# Patient Record
Sex: Female | Born: 1965 | Race: White | Hispanic: No | Marital: Married | Smoking: Never smoker
Health system: Southern US, Community
[De-identification: ages and names within clinical notes are randomized; demographics above are authoritative.]

## PROBLEM LIST (undated history)

## (undated) DIAGNOSIS — M1990 Unspecified osteoarthritis, unspecified site: Secondary | ICD-10-CM

## (undated) DIAGNOSIS — N189 Chronic kidney disease, unspecified: Secondary | ICD-10-CM

## (undated) DIAGNOSIS — C7A8 Other malignant neuroendocrine tumors: Secondary | ICD-10-CM

## (undated) DIAGNOSIS — E785 Hyperlipidemia, unspecified: Secondary | ICD-10-CM

## (undated) DIAGNOSIS — I10 Essential (primary) hypertension: Secondary | ICD-10-CM

## (undated) DIAGNOSIS — Q613 Polycystic kidney, unspecified: Secondary | ICD-10-CM

## (undated) DIAGNOSIS — H269 Unspecified cataract: Secondary | ICD-10-CM

## (undated) DIAGNOSIS — Z87440 Personal history of urinary (tract) infections: Secondary | ICD-10-CM

## (undated) DIAGNOSIS — N2881 Hypertrophy of kidney: Secondary | ICD-10-CM

## (undated) DIAGNOSIS — L309 Dermatitis, unspecified: Secondary | ICD-10-CM

## (undated) DIAGNOSIS — L409 Psoriasis, unspecified: Secondary | ICD-10-CM

## (undated) DIAGNOSIS — Z8619 Personal history of other infectious and parasitic diseases: Secondary | ICD-10-CM

## (undated) DIAGNOSIS — E119 Type 2 diabetes mellitus without complications: Secondary | ICD-10-CM

## (undated) DIAGNOSIS — Z940 Kidney transplant status: Secondary | ICD-10-CM

## (undated) DIAGNOSIS — R0600 Dyspnea, unspecified: Secondary | ICD-10-CM

## (undated) HISTORY — DX: Personal history of other infectious and parasitic diseases: Z86.19

## (undated) HISTORY — PX: APPENDECTOMY: SHX54

## (undated) HISTORY — DX: Polycystic kidney, unspecified: Q61.3

## (undated) HISTORY — DX: Hypertrophy of kidney: N28.81

## (undated) HISTORY — DX: Essential (primary) hypertension: I10

## (undated) HISTORY — DX: Chronic kidney disease, unspecified: N18.9

## (undated) HISTORY — DX: Personal history of urinary (tract) infections: Z87.440

## (undated) HISTORY — PX: ABDOMINAL HYSTERECTOMY: SHX81

## (undated) HISTORY — DX: Type 2 diabetes mellitus without complications: E11.9

## (undated) HISTORY — PX: SMALL INTESTINE SURGERY: SHX150

## (undated) HISTORY — DX: Hyperlipidemia, unspecified: E78.5

## (undated) HISTORY — DX: Dermatitis, unspecified: L30.9

## (undated) HISTORY — DX: Unspecified osteoarthritis, unspecified site: M19.90

## (undated) HISTORY — PX: NEPHRECTOMY TRANSPLANTED ORGAN: SUR880

## (undated) HISTORY — PX: COLON SURGERY: SHX602

## (undated) HISTORY — DX: Kidney transplant status: Z94.0

## (undated) HISTORY — DX: Other malignant neuroendocrine tumors: C7A.8

## (undated) HISTORY — PX: PERITONEAL CATHETER INSERTION: SHX2223

## (undated) HISTORY — DX: Unspecified cataract: H26.9

## (undated) HISTORY — DX: Psoriasis, unspecified: L40.9

---

## 1998-07-26 ENCOUNTER — Emergency Department (HOSPITAL_COMMUNITY): Admission: EM | Admit: 1998-07-26 | Discharge: 1998-07-26 | Payer: Self-pay | Admitting: *Deleted

## 1999-10-15 ENCOUNTER — Encounter: Payer: Self-pay | Admitting: Family Medicine

## 1999-10-15 ENCOUNTER — Ambulatory Visit (HOSPITAL_COMMUNITY): Admission: RE | Admit: 1999-10-15 | Discharge: 1999-10-15 | Payer: Self-pay | Admitting: Family Medicine

## 1999-11-08 ENCOUNTER — Encounter: Payer: Self-pay | Admitting: Nephrology

## 1999-11-08 ENCOUNTER — Ambulatory Visit (HOSPITAL_COMMUNITY): Admission: RE | Admit: 1999-11-08 | Discharge: 1999-11-08 | Payer: Self-pay | Admitting: Nephrology

## 2000-04-12 ENCOUNTER — Encounter: Admission: RE | Admit: 2000-04-12 | Discharge: 2000-04-12 | Payer: Self-pay | Admitting: Obstetrics and Gynecology

## 2000-04-12 ENCOUNTER — Encounter: Payer: Self-pay | Admitting: Obstetrics and Gynecology

## 2000-04-20 ENCOUNTER — Encounter (INDEPENDENT_AMBULATORY_CARE_PROVIDER_SITE_OTHER): Payer: Self-pay

## 2000-04-20 ENCOUNTER — Other Ambulatory Visit: Admission: RE | Admit: 2000-04-20 | Discharge: 2000-04-20 | Payer: Self-pay | Admitting: Obstetrics and Gynecology

## 2000-07-04 ENCOUNTER — Encounter: Admission: RE | Admit: 2000-07-04 | Discharge: 2000-07-04 | Payer: Self-pay | Admitting: Nephrology

## 2000-07-04 ENCOUNTER — Encounter: Payer: Self-pay | Admitting: Nephrology

## 2000-07-12 ENCOUNTER — Encounter: Admission: RE | Admit: 2000-07-12 | Discharge: 2000-07-12 | Payer: Self-pay | Admitting: Obstetrics and Gynecology

## 2000-07-12 ENCOUNTER — Encounter: Payer: Self-pay | Admitting: Obstetrics and Gynecology

## 2000-07-29 ENCOUNTER — Emergency Department (HOSPITAL_COMMUNITY): Admission: EM | Admit: 2000-07-29 | Discharge: 2000-07-29 | Payer: Self-pay | Admitting: Emergency Medicine

## 2000-07-29 ENCOUNTER — Encounter: Payer: Self-pay | Admitting: Emergency Medicine

## 2000-09-06 ENCOUNTER — Encounter: Admission: RE | Admit: 2000-09-06 | Discharge: 2000-09-06 | Payer: Self-pay | Admitting: Obstetrics and Gynecology

## 2000-09-06 ENCOUNTER — Encounter: Payer: Self-pay | Admitting: Obstetrics and Gynecology

## 2000-09-26 ENCOUNTER — Other Ambulatory Visit: Admission: RE | Admit: 2000-09-26 | Discharge: 2000-09-26 | Payer: Self-pay | Admitting: Obstetrics and Gynecology

## 2000-11-02 ENCOUNTER — Encounter (INDEPENDENT_AMBULATORY_CARE_PROVIDER_SITE_OTHER): Payer: Self-pay

## 2000-11-02 ENCOUNTER — Inpatient Hospital Stay (HOSPITAL_COMMUNITY): Admission: RE | Admit: 2000-11-02 | Discharge: 2000-11-05 | Payer: Self-pay | Admitting: Obstetrics and Gynecology

## 2003-01-11 HISTORY — PX: HERNIA REPAIR: SHX51

## 2003-01-19 ENCOUNTER — Emergency Department (HOSPITAL_COMMUNITY): Admission: EM | Admit: 2003-01-19 | Discharge: 2003-01-19 | Payer: Self-pay | Admitting: Emergency Medicine

## 2003-02-21 ENCOUNTER — Ambulatory Visit (HOSPITAL_COMMUNITY): Admission: RE | Admit: 2003-02-21 | Discharge: 2003-02-21 | Payer: Self-pay | Admitting: General Surgery

## 2003-07-23 ENCOUNTER — Emergency Department (HOSPITAL_COMMUNITY): Admission: EM | Admit: 2003-07-23 | Discharge: 2003-07-23 | Payer: Self-pay | Admitting: *Deleted

## 2003-11-10 ENCOUNTER — Other Ambulatory Visit: Admission: RE | Admit: 2003-11-10 | Discharge: 2003-11-10 | Payer: Self-pay | Admitting: Obstetrics and Gynecology

## 2004-11-10 ENCOUNTER — Other Ambulatory Visit: Admission: RE | Admit: 2004-11-10 | Discharge: 2004-11-10 | Payer: Self-pay | Admitting: Obstetrics and Gynecology

## 2005-07-14 ENCOUNTER — Encounter: Admission: RE | Admit: 2005-07-14 | Discharge: 2005-07-14 | Payer: Self-pay | Admitting: Nephrology

## 2006-01-06 ENCOUNTER — Other Ambulatory Visit: Admission: RE | Admit: 2006-01-06 | Discharge: 2006-01-06 | Payer: Self-pay | Admitting: Obstetrics and Gynecology

## 2006-04-18 ENCOUNTER — Encounter: Admission: RE | Admit: 2006-04-18 | Discharge: 2006-04-18 | Payer: Self-pay | Admitting: Nephrology

## 2007-01-09 ENCOUNTER — Other Ambulatory Visit: Admission: RE | Admit: 2007-01-09 | Discharge: 2007-01-09 | Payer: Self-pay | Admitting: Obstetrics and Gynecology

## 2009-01-10 HISTORY — PX: CATARACT EXTRACTION: SUR2

## 2009-05-15 ENCOUNTER — Emergency Department (HOSPITAL_COMMUNITY): Admission: EM | Admit: 2009-05-15 | Discharge: 2009-05-15 | Payer: Self-pay | Admitting: Emergency Medicine

## 2009-10-02 ENCOUNTER — Emergency Department (HOSPITAL_COMMUNITY): Admission: EM | Admit: 2009-10-02 | Discharge: 2009-10-02 | Payer: Self-pay | Admitting: Emergency Medicine

## 2009-10-05 ENCOUNTER — Encounter: Admission: RE | Admit: 2009-10-05 | Discharge: 2009-10-05 | Payer: Self-pay | Admitting: Orthopedic Surgery

## 2010-01-31 ENCOUNTER — Encounter: Payer: Self-pay | Admitting: Nephrology

## 2010-02-04 ENCOUNTER — Encounter
Admission: RE | Admit: 2010-02-04 | Discharge: 2010-02-09 | Payer: Self-pay | Source: Home / Self Care | Attending: Nephrology | Admitting: Nephrology

## 2010-03-23 ENCOUNTER — Other Ambulatory Visit: Payer: Self-pay | Admitting: Obstetrics and Gynecology

## 2010-03-23 DIAGNOSIS — Z1231 Encounter for screening mammogram for malignant neoplasm of breast: Secondary | ICD-10-CM

## 2010-03-30 LAB — BASIC METABOLIC PANEL
Chloride: 107 mEq/L (ref 96–112)
Creatinine, Ser: 3.18 mg/dL — ABNORMAL HIGH (ref 0.4–1.2)
GFR calc Af Amer: 19 mL/min — ABNORMAL LOW (ref >60)
GFR calc non Af Amer: 16 mL/min — ABNORMAL LOW (ref >60)
Potassium: 3.7 mEq/L (ref 3.5–5.1)

## 2010-03-30 LAB — DIFFERENTIAL
Eosinophils Absolute: 0.2 10*3/uL (ref 0.0–0.7)
Lymphocytes Relative: 21 % (ref 12–46)
Lymphs Abs: 1.3 10*3/uL (ref 0.7–4.0)
Monocytes Relative: 7 % (ref 3–12)
Neutro Abs: 4.2 10*3/uL (ref 1.7–7.7)
Neutrophils Relative %: 68 % (ref 43–77)

## 2010-03-30 LAB — GLUCOSE, CAPILLARY

## 2010-03-30 LAB — URINALYSIS, ROUTINE W REFLEX MICROSCOPIC
Leukocytes, UA: NEGATIVE
Nitrite: NEGATIVE
Specific Gravity, Urine: 1.011 (ref 1.005–1.030)
Urobilinogen, UA: 0.2 mg/dL (ref 0.0–1.0)
pH: 6 (ref 5.0–8.0)

## 2010-03-30 LAB — URINE MICROSCOPIC-ADD ON

## 2010-03-30 LAB — POCT I-STAT, CHEM 8
Chloride: 108 mEq/L (ref 96–112)
HCT: 38 % (ref 36.0–46.0)
Hemoglobin: 12.9 g/dL (ref 12.0–15.0)
Potassium: 3.5 mEq/L (ref 3.5–5.1)
Sodium: 142 mEq/L (ref 135–145)

## 2010-03-30 LAB — CBC
MCV: 95 fL (ref 78.0–100.0)
RBC: 3.82 MIL/uL — ABNORMAL LOW (ref 3.87–5.11)
WBC: 6.1 10*3/uL (ref 4.0–10.5)

## 2010-03-31 ENCOUNTER — Ambulatory Visit
Admission: RE | Admit: 2010-03-31 | Discharge: 2010-03-31 | Disposition: A | Discharge status: Home / Self Care | Payer: Managed Care, Other (non HMO) | Source: Ambulatory Visit | Attending: *Deleted | Admitting: *Deleted

## 2010-03-31 DIAGNOSIS — Z1231 Encounter for screening mammogram for malignant neoplasm of breast: Secondary | ICD-10-CM

## 2010-05-28 NOTE — Discharge Summary (Signed)
[facility]  Patient:    [name] [name], [place] Visit Number: [id number] MRN: [id number]          Service Type: GYN Location: [id number] Attending Physician:  Madelyn Flavors Dictated by:   Beather Arbour Thomasena Edis, M.D. Admit Date:  11/02/2000 Discharge Date: 11/05/2000   CC:         Fayrene Fearing L. Deterding, M.D.   Discharge Summary  HISTORY OF PRESENT ILLNESS:   The patient is a 45 year old para 2, Caucasian female, admitted for TAH-BSO for menorrhagia unresponsive to medical management.  Patient had an ultrasound, sonohysterogram, endometrial biopsy, all of which were normal.  TSH was normal.  She was treated with Micronor, but continued to bleed daily.  She expressed the desire strongly on numerous occasions to have a hysterectomy.  She states that she is concerned that she may become so ill from her polycystic kidney disease that she will be unable to have a hysterectomy.  Risks were discussed with the patient and she still desired to proceed with the surgery.  For further details, please see the written admission history and physical.  HOSPITAL COURSE:              The patient was admitted, subsequently underwent a TAH-BSO without difficulty.  Postoperatively, the patient was doing well, receiving excellent analgesia.  On postoperative day 1, temperature was 98.5. She was complaining of some right lateral rib pain with walking.  Blood pressure 120/65.  The rib pain was felt to be costochondritis or gas entrapment.  White count was 5000, hemoglobin was 11.5.  Patient continued to have a stable postoperative course and was discharged home on postoperative day 3, after she was able to tolerate a regular diet.  She had not passed any flatus, but was able to tolerate a diet, so it was this physicians judgment that it was fine to send her home.  Basic metabolic profile was normal. Patient was given a prescription for Tylox, dispense 30, 1-2 p.o.  q.4-6h. p.r.n. pain.  She subsequently did pass flatus and was discharged home.  She was urged to return to the office to have staples removed in a few days.  The patient will call with any problems. Dictated by:   Beather Arbour Thomasena Edis, M.D. Attending Physician:  Madelyn Flavors DD:  12/13/00 TD:  12/13/00 Job: [id number]

## 2010-05-28 NOTE — Op Note (Signed)
NAME:  [name] [name], [name] [name]                           ACCOUNT NO.:  [id number]   MEDICAL RECORD NO.:  [id number]                   PATIENT TYPE:  AMB   LOCATION:  DAY                                  FACILITY:  [facility]   PHYSICIAN:  Ollen Gross. Vernell Morgans, M.D.              DATE OF BIRTH:  09-Mar-1965   DATE OF PROCEDURE:  02/21/2003  DATE OF DISCHARGE:                                 OPERATIVE REPORT   PREOPERATIVE DIAGNOSIS:  Umbilical hernia.   POSTOPERATIVE DIAGNOSIS:  Umbilical hernia.   OPERATION/PROCEDURE:  Umbilical hernia repair with mesh.   SURGEON:  Ollen Gross. Carolynne Edouard, M.D.   ANESTHESIA:  General endotracheal anesthesia.   OPERATION/PROCEDURE:  After informed consent was obtained, the patient was  brought to the operating room and placed in the supine position on the  operating room table.  After adequate induction of general anesthesia, the  patient's abdomen was prepped with Betadine and draped in the usual sterile  manner.  The area above the umbilicus was infiltrated with 0.25% Marcaine  and a small curvilinear incision was made overlying the umbilicus.  This  incision was carried down through the skin and subcutaneous tissue sharply  with the electrocautery.  The hernia sac was identified and was dissected  from the rest of the subcutaneous tissues by a combination of blunt  dissection and sharp dissection with the electrocautery until the edges of  the fascial defect were encountered.  The hernia was able to be reduced.  The hernia only contained some preperitoneal fat.  The hernia was able to be  reduced without difficulty.  The fascial edges were cleaned and strong and  the defect was only about 1 cm in diameter.  The fascial defect was then  closed with interrupted #1 Prolene stitches.  A small piece of 3 x 6 mesh  was then cut to about 3 cm in diameter and this mesh was placed as an on-lay  on top of the repair and anchored circumferentially with interrupted 0  Prolene stitches to  the fascia.  The wound was then irrigated with copious  amounts of saline.  The subcutaneous tissue was closed with interrupted 3-0  Vicryl stitches and the skin was closed with a running 4-0 Monocryl  subcuticular stitch.  Benzoin and Steri-Strips, cotton balls and sterile  dressings were applied.  The patient tolerated the procedure well.  At the  end of the case all needle, sponge and instrument counts were correct.  The  patient was awakened and taken to the recovery room in stable condition.                                               Ollen Gross. Vernell Morgans, M.D.    PST/MEDQ  D:  02/22/2003  T:  02/22/2003  Job:  [id number]

## 2010-09-08 ENCOUNTER — Ambulatory Visit (INDEPENDENT_AMBULATORY_CARE_PROVIDER_SITE_OTHER): Payer: Managed Care, Other (non HMO) | Admitting: General Surgery

## 2010-09-08 ENCOUNTER — Encounter (INDEPENDENT_AMBULATORY_CARE_PROVIDER_SITE_OTHER): Payer: Self-pay | Admitting: General Surgery

## 2010-09-08 VITALS — BP 146/96 | HR 80 | Temp 97.6°F | Ht 65.0 in | Wt 241.8 lb

## 2010-09-08 DIAGNOSIS — N186 End stage renal disease: Secondary | ICD-10-CM

## 2010-09-08 DIAGNOSIS — Z992 Dependence on renal dialysis: Secondary | ICD-10-CM

## 2010-09-08 NOTE — Progress Notes (Signed)
Subjective:   End-stage renal disease, desires peritoneal dialysis  Patient ID: [name] [name], female   DOB: 1965/12/29, 45 y.o.   MRN: [id number]  HPI Patient is a 45 year old female with polycystic kidney disease and progressive renal insufficiency. She is followed by Dr. Kathrene Bongo. Her GFR is now calculated in the teens and she is beginning to have some symptoms that suggest uremia. She has been to class and feels that she would do much better with peritoneal dialysis.  Past Medical History  Diagnosis Date  . Eczema   . Psoriasis   . Hypertension   . Enlarged kidney     bilateral  . Chronic kidney disease   . Gout     previously   Past Surgical History  Procedure Date  . Cesarean section 1995 and 1997  . Abdominal hysterectomy 2000?    complete  . Hernia repair 2005    umbilical hernia  . Cataract extraction 2011    bilateral   Current Outpatient Prescriptions  Medication Sig Dispense Refill  . Acetaminophen (TYLENOL PO) Take by mouth as needed.        Marland Kitchen allopurinol (ZYLOPRIM) 100 MG tablet Three times a day.      Marland Kitchen atorvastatin (LIPITOR) 40 MG tablet daily.      . calcitRIOL (ROCALTROL) 0.5 MCG capsule daily.      . furosemide (LASIX) 40 MG tablet BID times 48H.      . Multiple Vitamin (MULTIVITAMIN) capsule Take 1 capsule by mouth daily.        Marland Kitchen omeprazole (PRILOSEC) 20 MG capsule Take 20 mg by mouth daily.        . quinapril (ACCUPRIL) 20 MG tablet daily.      . traMADol (ULTRAM) 50 MG tablet Ad lib.       No Known Allergies  History  Substance Use Topics  . Smoking status: Never Smoker   . Smokeless tobacco: Not on file  . Alcohol Use: No    Review of Systems  Constitutional: Negative.   Eyes: Negative.   Respiratory: Negative.   Cardiovascular: Negative.   Gastrointestinal: Positive for nausea. Negative for vomiting, abdominal pain, constipation and abdominal distention.  Musculoskeletal: Positive for arthralgias.  Neurological: Positive for headaches.         Objective:   Physical Exam General: Obese Caucasian female in no acute distress Skin: Warm and dry without rash or infection HEENT: No palpable masses or thyromegaly. Sclera nonicteric. Lymph nodes: No cervical, supraclavicular, or inguinal nodes palpable Lungs: Clear without wheezing or increased work of breathing Cardiovascular: Regular rate and rhythm without murmur. No JVD or edema. Abdomen: Soft and nontender. Healed small umbilical incision and Pfannenstiel incision. No discernible masses or organomegaly. Extremities: No edema or joint swelling Neurologic: Alert and fully oriented. Gait normal.   Assessment:     45 year old female with end-stage renal disease secondary to polycystic kidneys and impending need for dialysis. She desires peritoneal dialysis. She would appear to be an acceptable candidate. We discussed peritoneal catheter placement including its nature and risks of bleeding, infection, bowel injury, and anesthetic complications. We discussed long-term problems of peritonitis, exit site infection, catheter displacement and malfunction    Plan:     After our discussion she desires placement of a peritoneal dialysis catheter. We will schedule this in the near future as an outpatient.

## 2010-09-08 NOTE — Patient Instructions (Signed)
Call for any questions or concerns preoperatively

## 2010-10-19 ENCOUNTER — Encounter (HOSPITAL_COMMUNITY)
Admission: RE | Admit: 2010-10-19 | Discharge: 2010-10-19 | Disposition: A | Discharge status: Home / Self Care | Payer: Managed Care, Other (non HMO) | Source: Ambulatory Visit | Attending: General Surgery | Admitting: General Surgery

## 2010-10-19 ENCOUNTER — Other Ambulatory Visit (INDEPENDENT_AMBULATORY_CARE_PROVIDER_SITE_OTHER): Payer: Self-pay | Admitting: General Surgery

## 2010-10-19 DIAGNOSIS — N186 End stage renal disease: Secondary | ICD-10-CM

## 2010-10-19 LAB — CBC
Platelets: 213 10*3/uL (ref 150–400)
RBC: 4.18 MIL/uL (ref 3.87–5.11)
WBC: 6.4 10*3/uL (ref 4.0–10.5)

## 2010-10-19 LAB — DIFFERENTIAL
Basophils Relative: 1 % (ref 0–1)
Eosinophils Absolute: 0.3 10*3/uL (ref 0.0–0.7)
Lymphs Abs: 1.6 10*3/uL (ref 0.7–4.0)
Neutrophils Relative %: 62 % (ref 43–77)

## 2010-10-19 LAB — BASIC METABOLIC PANEL
CO2: 25 mEq/L (ref 19–32)
Chloride: 102 mEq/L (ref 96–112)
Sodium: 141 mEq/L (ref 135–145)

## 2010-10-19 LAB — SURGICAL PCR SCREEN
MRSA, PCR: NEGATIVE
Staphylococcus aureus: NEGATIVE

## 2010-10-22 ENCOUNTER — Ambulatory Visit (HOSPITAL_COMMUNITY)
Admission: RE | Admit: 2010-10-22 | Discharge: 2010-10-22 | Disposition: A | Discharge status: Home / Self Care | Payer: Managed Care, Other (non HMO) | Source: Ambulatory Visit | Attending: General Surgery | Admitting: General Surgery

## 2010-10-22 DIAGNOSIS — N186 End stage renal disease: Secondary | ICD-10-CM | POA: Insufficient documentation

## 2010-10-22 DIAGNOSIS — Z01810 Encounter for preprocedural cardiovascular examination: Secondary | ICD-10-CM | POA: Insufficient documentation

## 2010-10-22 DIAGNOSIS — I120 Hypertensive chronic kidney disease with stage 5 chronic kidney disease or end stage renal disease: Secondary | ICD-10-CM | POA: Insufficient documentation

## 2010-10-22 DIAGNOSIS — Z01812 Encounter for preprocedural laboratory examination: Secondary | ICD-10-CM | POA: Insufficient documentation

## 2010-10-22 DIAGNOSIS — Z01818 Encounter for other preprocedural examination: Secondary | ICD-10-CM | POA: Insufficient documentation

## 2010-10-29 ENCOUNTER — Emergency Department (HOSPITAL_COMMUNITY): Payer: Managed Care, Other (non HMO)

## 2010-10-29 ENCOUNTER — Inpatient Hospital Stay (HOSPITAL_COMMUNITY)
Admission: EM | Admit: 2010-10-29 | Discharge: 2010-11-02 | DRG: 919 | Disposition: A | Discharge status: Home / Self Care | Payer: Managed Care, Other (non HMO) | Source: Ambulatory Visit | Attending: Internal Medicine | Admitting: Internal Medicine

## 2010-10-29 DIAGNOSIS — E785 Hyperlipidemia, unspecified: Secondary | ICD-10-CM | POA: Diagnosis present

## 2010-10-29 DIAGNOSIS — Y841 Kidney dialysis as the cause of abnormal reaction of the patient, or of later complication, without mention of misadventure at the time of the procedure: Secondary | ICD-10-CM | POA: Diagnosis present

## 2010-10-29 DIAGNOSIS — M109 Gout, unspecified: Secondary | ICD-10-CM | POA: Diagnosis present

## 2010-10-29 DIAGNOSIS — Z79899 Other long term (current) drug therapy: Secondary | ICD-10-CM

## 2010-10-29 DIAGNOSIS — K219 Gastro-esophageal reflux disease without esophagitis: Secondary | ICD-10-CM | POA: Diagnosis present

## 2010-10-29 DIAGNOSIS — Y92009 Unspecified place in unspecified non-institutional (private) residence as the place of occurrence of the external cause: Secondary | ICD-10-CM

## 2010-10-29 DIAGNOSIS — K5900 Constipation, unspecified: Secondary | ICD-10-CM | POA: Diagnosis present

## 2010-10-29 DIAGNOSIS — N186 End stage renal disease: Secondary | ICD-10-CM | POA: Diagnosis present

## 2010-10-29 DIAGNOSIS — N2581 Secondary hyperparathyroidism of renal origin: Secondary | ICD-10-CM | POA: Diagnosis present

## 2010-10-29 DIAGNOSIS — I120 Hypertensive chronic kidney disease with stage 5 chronic kidney disease or end stage renal disease: Secondary | ICD-10-CM | POA: Diagnosis present

## 2010-10-29 DIAGNOSIS — N179 Acute kidney failure, unspecified: Secondary | ICD-10-CM | POA: Diagnosis present

## 2010-10-29 DIAGNOSIS — Z7682 Awaiting organ transplant status: Secondary | ICD-10-CM

## 2010-10-29 DIAGNOSIS — T8571XA Infection and inflammatory reaction due to peritoneal dialysis catheter, initial encounter: Principal | ICD-10-CM | POA: Diagnosis present

## 2010-10-29 DIAGNOSIS — K658 Other peritonitis: Secondary | ICD-10-CM | POA: Diagnosis present

## 2010-10-29 DIAGNOSIS — Q612 Polycystic kidney, adult type: Secondary | ICD-10-CM

## 2010-10-29 LAB — CBC
HCT: 40.9 % (ref 36.0–46.0)
Hemoglobin: 13.8 g/dL (ref 12.0–15.0)
RBC: 4.45 MIL/uL (ref 3.87–5.11)
RDW: 13.9 % (ref 11.5–15.5)
WBC: 15 10*3/uL — ABNORMAL HIGH (ref 4.0–10.5)

## 2010-10-29 LAB — BODY FLUID CELL COUNT WITH DIFFERENTIAL
Eos, Fluid: 3 %
Lymphs, Fluid: 9 %
Monocyte-Macrophage-Serous Fluid: 20 % — ABNORMAL LOW (ref 50–90)
Neutrophil Count, Fluid: 67 % — ABNORMAL HIGH (ref 0–25)

## 2010-10-29 LAB — URINALYSIS, ROUTINE W REFLEX MICROSCOPIC
Glucose, UA: NEGATIVE mg/dL
Ketones, ur: NEGATIVE mg/dL
pH: 5 (ref 5.0–8.0)

## 2010-10-29 LAB — COMPREHENSIVE METABOLIC PANEL
AST: 16 U/L (ref 0–37)
Albumin: 3.7 g/dL (ref 3.5–5.2)
Chloride: 95 mEq/L — ABNORMAL LOW (ref 96–112)
Creatinine, Ser: 4.28 mg/dL — ABNORMAL HIGH (ref 0.50–1.10)
Total Bilirubin: 0.7 mg/dL (ref 0.3–1.2)

## 2010-10-29 LAB — APTT: aPTT: 28 seconds (ref 24–37)

## 2010-10-29 LAB — URINE MICROSCOPIC-ADD ON

## 2010-10-29 LAB — DIFFERENTIAL
Basophils Absolute: 0 10*3/uL (ref 0.0–0.1)
Lymphocytes Relative: 2 % — ABNORMAL LOW (ref 12–46)
Neutro Abs: 14 10*3/uL — ABNORMAL HIGH (ref 1.7–7.7)
Neutrophils Relative %: 94 % — ABNORMAL HIGH (ref 43–77)

## 2010-10-29 LAB — PROTIME-INR: INR: 1.03 (ref 0.00–1.49)

## 2010-10-30 LAB — PHOSPHORUS: Phosphorus: 5.5 mg/dL — ABNORMAL HIGH (ref 2.3–4.6)

## 2010-10-30 LAB — DIFFERENTIAL
Basophils Relative: 0 % (ref 0–1)
Eosinophils Absolute: 0 10*3/uL (ref 0.0–0.7)
Neutrophils Relative %: 88 % — ABNORMAL HIGH (ref 43–77)

## 2010-10-30 LAB — BASIC METABOLIC PANEL
CO2: 24 mEq/L (ref 19–32)
Calcium: 9.2 mg/dL (ref 8.4–10.5)
Chloride: 100 mEq/L (ref 96–112)
GFR calc Af Amer: 12 mL/min — ABNORMAL LOW (ref >90)
Sodium: 139 mEq/L (ref 135–145)

## 2010-10-30 LAB — CBC
Platelets: 177 10*3/uL (ref 150–400)
RBC: 4.02 MIL/uL (ref 3.87–5.11)
RDW: 14 % (ref 11.5–15.5)
WBC: 13.4 10*3/uL — ABNORMAL HIGH (ref 4.0–10.5)

## 2010-10-30 LAB — MAGNESIUM: Magnesium: 2.1 mg/dL (ref 1.5–2.5)

## 2010-10-31 DIAGNOSIS — K650 Generalized (acute) peritonitis: Secondary | ICD-10-CM

## 2010-10-31 LAB — CBC
HCT: 33 % — ABNORMAL LOW (ref 36.0–46.0)
MCH: 30.6 pg (ref 26.0–34.0)
MCV: 91.9 fL (ref 78.0–100.0)
Platelets: 168 10*3/uL (ref 150–400)
RBC: 3.59 MIL/uL — ABNORMAL LOW (ref 3.87–5.11)

## 2010-10-31 LAB — BASIC METABOLIC PANEL
BUN: 87 mg/dL — ABNORMAL HIGH (ref 6–23)
CO2: 21 mEq/L (ref 19–32)
Calcium: 8.7 mg/dL (ref 8.4–10.5)
Chloride: 99 mEq/L (ref 96–112)
Creatinine, Ser: 5.24 mg/dL — ABNORMAL HIGH (ref 0.50–1.10)
Glucose, Bld: 101 mg/dL — ABNORMAL HIGH (ref 70–99)

## 2010-11-01 LAB — BODY FLUID CELL COUNT WITH DIFFERENTIAL
Eos, Fluid: 2 %
Lymphs, Fluid: 3 %
Monocyte-Macrophage-Serous Fluid: 13 % — ABNORMAL LOW (ref 50–90)
Neutrophil Count, Fluid: 82 % — ABNORMAL HIGH (ref 0–25)
Total Nucleated Cell Count, Fluid: 600 cu mm (ref 0–1000)

## 2010-11-01 LAB — CBC
MCHC: 33.3 g/dL (ref 30.0–36.0)
Platelets: 157 10*3/uL (ref 150–400)
RDW: 13.8 % (ref 11.5–15.5)
WBC: 7.2 10*3/uL (ref 4.0–10.5)

## 2010-11-01 LAB — PATHOLOGIST SMEAR REVIEW

## 2010-11-01 NOTE — Op Note (Signed)
  NAMEFUJIE, DICKISON                 ACCOUNT NO.:  [id number]  MEDICAL RECORD NO.:  [id number]  LOCATION:  [facility]SC                         FACILITY:  [facility]  PHYSICIAN:  Sharlet Salina T. Isatu Macinnes, M.D.DATE OF BIRTH:  December 26, 1965  DATE OF PROCEDURE:  10/22/2010 DATE OF DISCHARGE:                              OPERATIVE REPORT   PREOPERATIVE DIAGNOSIS:  End-stage renal disease with pending dialysis.  POSTOPERATIVE DIAGNOSIS:  End-stage renal disease with pending dialysis.  SURGICAL PROCEDURE:  Laparoscopic placement of peritoneal dialysis catheter.  BRIEF HISTORY:  [name] [name] is a 45 year old female with polycystic kidney disease and deteriorating renal function and impending dialysis. She desires peritoneal dialysis and after evaluation was felt to be a good candidate.  She is now brought to the operating room for laparoscopic placement of a peritoneal dialysis catheter.  We discussed the nature of the procedure, indications, risks of anesthetic complications, bleeding, infection, intestinal injury, and long-term catheter risks of malfunction, displacement of peritonitis and exit site infection.  She is now brought to the operating room for this procedure.  DESCRIPTION OF OPERATION:  The patient was brought to the room and placed in supine position on the operating table and general endotracheal anesthesia was induced.  The abdomen was widely and sterilely prepped and draped.  She received preoperative intravenous antibiotics.  Correct patient, procedure were verified.  Access was obtained with a 5-mm Optiview trocar in the left upper quadrant without difficulty and pneumoperitoneum established.  There was no evidence of trocar injury.  Under direct vision, a 12-mm trocar was placed to the right of the umbilicus.  The patient was status post hysterectomy. There were no significant adhesions in the pelvis.  A right-sided [place] peritoneal dialysis catheter was chosen and the catheter  was oriented and then passed through the 12-mm trocar and advanced until the silastic ball was just deep to the peritoneum as the 12-mm trocar was gradually withdrawn leaving the Teflon cuff in the abdominal wall.  The catheter was seen directly down into the pelvis in the midline. Following this, the external end of the catheter was tunneled subcutaneously to an inferior exit site and the cuff positioned about a cm deep to the skin.  I flushed the catheter with saline and flushed and drained easily.  All CO2 was evacuated and trocars removed.  Skin incisions were closed with running 5-0 nylon.  Sponge, needle, and instrument counts were correct.  The patient was taken to the recovery room in good condition.     Lorne Skeens. Elody Kleinsasser, M.D.    Tory Emerald  D:  10/22/2010  T:  10/22/2010  Job:  [id number]  cc:   Cecille Aver, M.D.  Electronically Signed by Glenna Fellows M.D. on 11/01/2010 02:45:01 PM

## 2010-11-02 LAB — RENAL FUNCTION PANEL
BUN: 56 mg/dL — ABNORMAL HIGH (ref 6–23)
CO2: 24 mEq/L (ref 19–32)
Chloride: 104 mEq/L (ref 96–112)
GFR calc Af Amer: 16 mL/min — ABNORMAL LOW (ref >90)
Glucose, Bld: 82 mg/dL (ref 70–99)
Phosphorus: 3 mg/dL (ref 2.3–4.6)
Potassium: 3.4 mEq/L — ABNORMAL LOW (ref 3.5–5.1)
Sodium: 142 mEq/L (ref 135–145)

## 2010-11-02 LAB — BODY FLUID CULTURE

## 2010-11-02 LAB — CBC
HCT: 32 % — ABNORMAL LOW (ref 36.0–46.0)
Hemoglobin: 10.7 g/dL — ABNORMAL LOW (ref 12.0–15.0)
MCHC: 33.4 g/dL (ref 30.0–36.0)
RBC: 3.51 MIL/uL — ABNORMAL LOW (ref 3.87–5.11)
WBC: 6.5 10*3/uL (ref 4.0–10.5)

## 2010-11-02 LAB — VANCOMYCIN, RANDOM: Vancomycin Rm: 9.9 ug/mL

## 2010-11-02 LAB — HEPATITIS B SURFACE ANTIGEN: Hepatitis B Surface Ag: NEGATIVE

## 2010-11-04 LAB — BODY FLUID CULTURE: Culture: NO GROWTH

## 2010-11-05 NOTE — Discharge Summary (Signed)
[name] [name], [name] [name]                 ACCOUNT NO.:  [id number]  MEDICAL RECORD NO.:  [id number]  LOCATION:  6737                         FACILITY:  [facility]  PHYSICIAN:  Debbora Presto, MD DATE OF BIRTH:  June 28, 1965  DATE OF ADMISSION:  10/29/2010 DATE OF DISCHARGE:  11/02/2010                              DISCHARGE SUMMARY   DISCHARGE MEDICATIONS: 1. Vancomycin 1500-mg injection 125 mL/hour IV, want to continue with     hemodialysis. 2. Levaquin 500-mg tablet, take 1 pill every other day for additional     14 days total. 3. Allopurinol 100-mg tablet, take 2 tablets in the morning and 1     tablet in the afternoon. 4. Atorvastatin 40-mg tablet by mouth every morning. 5. Benadryl 25-mg tablet daily. 6. Bisacodyl 5-mg tablet daily as needed for constipation. 7. Calcitriol 0.5-mcg capsule every morning. 8. Lasix 40-mg tablet, take 2 tablets twice daily. 9. Multivitamin 1 tablet daily. 10.Oxycodone/Tylenol 5/325-mg tablet, take 1-2 tablets by mouth every     4 hours as needed. 11.Prilosec 20-mg capsule daily. 12.Quinapril 20-mg tablet daily. 13.Tramadol 50-mg tablet twice daily as needed for pain. 14.Tylenol Extra Strength 500-mg tablet, take 2 tablets daily as     needed.  DISCHARGE DIAGNOSES: 1. Bacterial peritonitis. 2. Hypertension. 3. Polycystic kidney disease - awaiting kidney transplantation,     current plans for peritoneal hemodialysis. 4. Gout. 5. Hyperlipidemia.  DISPOSITION AND FOLLOWUP:  The patient was discharged from the hospital in stable condition and will follow up with her primary nephrologist. She was instructed by nephrologist to [facility] and to arrange peritoneal hemodialysis on Thursday.  Please note that the patient was discharged on Levaquin 500-mg tablet to take every other day based on her kidney function test, which she will need to complete for a total of 14 days.  Per discussion with Dr. Hyman Hopes, the patient will also continue  vancomycin with hemodialysis.  DIAGNOSTIC TESTS DURING THE HOSPITALIZATION:  On October 29, 2010, no acute abdominal pelvic finding identified, no evidence of appendicitis, moderate stool throughout the colon, small amount of ascites attributed to peritoneal dialysis, grossly stable appearance of the kidneys with marked enlargement and multiple cysts consistent with adult polycystic kidney disease, small amount of anterior pericardial fluid.  Peritoneal fluid analysis on October 29, 2010, cell count, wbc 3694, segmented neutrophils 67, lymphocytes 9, monocytes 20, eosinophils 3.  HISTORY OF PRESENT ILLNESS:  The patient is a very pleasant 45 year old female with history outlined above who presents to [facility] with main concern of progressively worsening abdominal pain that had started approximately a week prior to admission and is associated with fevers, chills, poor appetite, and shortness of breath which is worse with exertion.  The patient denied headache, chest pain, other abdominal concerns such as nausea or vomiting, and she has also denied urinary concerns of burning, urgency, dysuria, or blood in urine.  She reports making small amounts of urine.  She does note that she has about half the amount what she used to void.  PHYSICAL EXAMINATION UPON DISCHARGE:  VITAL SIGNS:  Temperature 98 Fahrenheit, pulse 74, respirations 21, blood pressure 107/68, saturating 95% on room air. GENERAL:  Sitting  in bed, not in acute distress. CARDIOVASCULAR:  Regular rate and rhythm.  S1 and S2.  No murmurs, rubs, gallops. LUNGS:  Clear to auscultation bilaterally.  No wheezing, rhonchi, rales. ABDOMEN:  Soft, nontender, nondistended.  Bowel sounds present. EXTREMITIES:  1+ bilateral pitting edema. NEUROLOGIC:  Grossly nonfocal.  RESULTS:  WBC 6.9, hemoglobin 10.7, platelets 173.  Sodium 142, potassium 3.4, chloride 104, bicarb 24, glucose 82, BUN 56, creatinine 3.72, albumin 2.7,  calcium 9.1, phosphorus 3.0.  Hepatitis panel pending.  Magnesium 2.1.  Culture of the peritoneal fluid pending final.  HOSPITAL COURSE BY PROBLEM: 1. Acute bacterial peritonitis.  The patient was started on broad-     spectrum antibiotics and will continue to take Levaquin in     outpatient setting.  She was initially started on vancomycin and     Zosyn.  Per discussion with ID and pharmacy, it was determined that     the patient needs to be taking 500 mg of Levaquin every other day     for additional 14 days of treatment.  The patient has responded     well to the measures provided in the hospital with IV fluids,     analgesics, and antiemetics, and has shown significant clinical     improvement.  She is denying nausea, vomiting, or abdominal pain     today at discharge 2. Polycystic kidney disease - Renal Service was consulted and     recommendation was to continue antibiotics in an outpatient     setting, Levaquin plus vancomycin with hemodialysis.  The patient     was also advised to call [facility] and we will proceed with     peritoneal hemodialysis on Thursday.  Over 30 minutes spent on discharging the patient.     Debbora Presto, MD     IM/MEDQ  D:  11/02/2010  T:  11/02/2010  Job:  [id number]  cc:   Dr. Darrick Penna  Electronically Signed by Debbora Presto MD on 11/05/2010 03:07:47 PM

## 2010-11-07 NOTE — Consult Note (Signed)
  [name], [name]                 ACCOUNT NO.:  [id number]  MEDICAL RECORD NO.:  [id number]  LOCATION:  6737                         FACILITY:  [facility]  PHYSICIAN:  Gabrielle Dare. Janee Morn, M.D.DATE OF BIRTH:  03-Nov-1965  DATE OF CONSULTATION:  10/31/2010 DATE OF DISCHARGE:                                CONSULTATION   CHIEF COMPLAINT:  Abdominal pain and nausea.  HISTORY OF PRESENT ILLNESS:  [name] [name] is a 45 year old white female who underwent laparoscopic placement of CAPD catheter by Dr. Glenna Fellows on October 22, 2010.  The patient initially did well, but then developed abdominal pain, nausea and vomiting, starting on October 29, 2010, when she was admitted to the hospital.  She is being followed in consultation by the Renal Service and they suspect CAPD catheter- associated bacterial peritonitis in the vastus.  For surgical consultation, Dr. Arlean Hopping made this request today.  PAST MEDICAL HISTORY:  Polycystic kidney disease, hypertension, end- stage renal disease, gout, and hyperlipidemia.  PAST SURGICAL HISTORY:  See above.  ALLERGIES:  No known drug allergies.  MEDICATIONS:  Allopurinol, atorvastatin, calcitriol, Lasix, multivitamin, Percocet, Prilosec, quinapril, and tramadol.  REVIEW OF SYSTEMS:  Include GI as above and 14 other points with the exception of hypertension for cardiovascular and end-stage renal disease for GU were unremarkable.  PHYSICAL EXAMINATION:  VITAL SIGNS:  Temperature 99.4, heart rate 105, respirations 19, blood pressure 97/57. GENERAL:  The patient is awake and alert. NECK:  Supple. LUNGS:  Clear to auscultation. HEART:  Regular with no murmurs.  Impulses are palpable on the left chest.  ABDOMEN:  Soft.  She has mild generalized tenderness.  Her incision and CAPD catheter insertion site are clean dry and intact. Bowel sounds are present. Groin exam reveals some mild rash bilaterally. SKIN:  Warm and dry.  LABORATORY DATA:  Review  includes white blood cell count 11300, this is down from 15,000 on admission and 13,400 yesterday.  Cultures are pending.  CT scan of the abdomen and pelvis on admission demonstrated a CAPD catheter with minimal ascites consistent with a history of the catheter.  There were no unexpected findings.  There was no evidence of free air or bowel abnormalities.  IMPRESSION:  Suspected continuous ambulatory peritoneal dialysis- associated bacterial peritonitis.  I would recommend continuing antibiotic therapy as directed by Dr. Arlean Hopping in the Renal Service and I do not feel there is any emergent need to remove this catheter today.  I will discuss the case in detail with Dr. Johna Sheriff and we will follow along with you.     Gabrielle Dare Janee Morn, M.D.     BET/MEDQ  D:  10/31/2010  T:  11/01/2010  Job:  [id number]  cc:   Maree Krabbe, M.D. Lorne Skeens. Hoxworth, M.D.  Electronically Signed by Violeta Gelinas M.D. on 11/07/2010 08:53:50 AM

## 2010-11-08 NOTE — H&P (Signed)
NAME:  [name] [name], [name] [name] NO.:  [id number]  MEDICAL RECORD NO.:  [id number]  LOCATION:  [facility]                         FACILITY:  [facility]  PHYSICIAN:  Houston Siren, MD           DATE OF BIRTH:  [birth year]  DATE OF ADMISSION:  10/29/2010 DATE OF DISCHARGE:                             HISTORY & PHYSICAL   PRIMARY CARE PHYSICIAN:  None.  NEPHROLOGIST:  Beryle Lathe, M.D.  ADVANCED DIRECTIVES:  Full code.  REASON FOR ADMISSION:  Bacterial peritonitis.  HISTORY OF PRESENT ILLNESS:  This is a 45 year old female with history of acute renal failure on the basis of polycystic kidney disease, hypertension, along with history of hyperlipidemia, presents to the emergency room with abdominal pain and chills.  She had laparoscopic intraperitoneal catheter placement about 1 week ago.  There has been no complication, however, she started having abdominal pain and chills. She has no headache, shortness of breath,or chest pain.  She has been taking her pain medication and had some constipation.  There has been no black stool or bloody stool.  She is still able to put out urine, but she states that it is about half the amount what she used to urinate. Workup in the emergency room included a leukocytosis, white count 15,000 and hemoglobin of 13.8.  Her abdominal CT is without evidence of local abscesses.  There is evidence of increased stool.  Her urinalysis is negative.  Her creatinine is 4.3 with potassium of 3.2 and BUN of 70. The intraperitoneal fluid drawn showed 3694 WBC with 67% polys. Hospitalist was asked to admit the patient for bacterial peritonitis.  PAST MEDICAL HISTORY:  Hypertension, acute renal failure, gout, hyperlipidemia.  ALLERGIES:  No known drug allergies.  CURRENT MEDICATIONS:  Allopurinol, atorvastatin, calcitriol, Lasix, multivitamin, Percocet, Prilosec, quinapril, tramadol.  REVIEW OF SYSTEMS:  Otherwise unremarkable.  FAMILY HISTORY:   Noncontributory.  PHYSICAL EXAM:  VITAL SIGNS:  Blood pressure is 89/52, pulse of 100, respiratory rate of 20, temperature 98.7. GENERAL:  Exam show that she is lethargic but easily arousable, alert, oriented, and converses meaningfully. HEENT:  Sclerae are nonicteric.  Pupils equal, round, and reactive. NECK:  Supple.  Throat is clear.  No evidence of candida. CARDIAC:  S1, S2 tachycardic without any friction rub. LUNGS:  Clear.  No wheezes, rales, or any evidence of consolidation. ABDOMEN:  Peritoneal catheter in place.  No visible purulent discharge. It is diffusely tender.  Bowel sound is present.  No rebound. EXTREMITIES:  No edema.  No calf tenderness.  Skin is warm and dry.  No evidence of shock.  Good distal pulses bilaterally. NEUROLOGIC AND PSYCHIATRIC:  Exam is unremarkable.  OBJECTIVE FINDINGS:  Creatinine 4.3, potassium 3.2.  Peritoneal fluid 3694, WBC with 67% polys.  Urinalysis is negative.  White count 15,000. Abdominal and pelvic CT, no abscess, increased stools.  IMPRESSION:  This is a 45 year old female with acute renal failureanticipating renal dialysis who just had placement of the peritoneal catheter, admitted for bacterial peritonitis.  She was given vancomycin and Zosyn, and I think that these are good antibiotics, and thus I will continue both.  She is a bit  hypotensive and despite acute renal failure, we will give her a little bit of IV fluid.  We will give her some potassium supplements as well.  She has no evidence of shock and will put her in 6700 under [facility] 6.  Her medication has been reconciled, and I will continue all of them as well, except that I will stop the Lasix.  We will give her some pain medication and some laxative, so she can move her bowels.  She is stable otherwise and is a full code.     Houston Siren, MD     PL/MEDQ  D:  10/29/2010  T:  10/29/2010  Job:  [id number]  Electronically Signed by Houston Siren  on 11/08/2010 07:00:08 PM

## 2010-12-29 ENCOUNTER — Other Ambulatory Visit: Payer: Self-pay | Admitting: Nephrology

## 2010-12-29 ENCOUNTER — Ambulatory Visit
Admission: RE | Admit: 2010-12-29 | Discharge: 2010-12-29 | Disposition: A | Discharge status: Home / Self Care | Payer: Managed Care, Other (non HMO) | Source: Ambulatory Visit | Attending: Nephrology | Admitting: Nephrology

## 2010-12-29 DIAGNOSIS — Z4902 Encounter for fitting and adjustment of peritoneal dialysis catheter: Secondary | ICD-10-CM

## 2011-01-11 HISTORY — PX: HERNIA REPAIR: SHX51

## 2011-04-18 ENCOUNTER — Other Ambulatory Visit: Payer: Self-pay | Admitting: Obstetrics and Gynecology

## 2011-04-18 DIAGNOSIS — Z1231 Encounter for screening mammogram for malignant neoplasm of breast: Secondary | ICD-10-CM

## 2011-04-22 ENCOUNTER — Ambulatory Visit
Admission: RE | Admit: 2011-04-22 | Discharge: 2011-04-22 | Disposition: A | Discharge status: Home / Self Care | Payer: Managed Care, Other (non HMO) | Source: Ambulatory Visit | Attending: Obstetrics and Gynecology | Admitting: Obstetrics and Gynecology

## 2011-04-22 DIAGNOSIS — Z1231 Encounter for screening mammogram for malignant neoplasm of breast: Secondary | ICD-10-CM

## 2011-06-07 ENCOUNTER — Other Ambulatory Visit: Payer: Self-pay | Admitting: Nephrology

## 2011-06-07 ENCOUNTER — Ambulatory Visit (INDEPENDENT_AMBULATORY_CARE_PROVIDER_SITE_OTHER): Payer: Managed Care, Other (non HMO) | Admitting: Cardiology

## 2011-06-07 ENCOUNTER — Ambulatory Visit
Admission: RE | Admit: 2011-06-07 | Discharge: 2011-06-07 | Disposition: A | Discharge status: Home / Self Care | Payer: Managed Care, Other (non HMO) | Source: Ambulatory Visit | Attending: Nephrology | Admitting: Nephrology

## 2011-06-07 ENCOUNTER — Encounter: Payer: Self-pay | Admitting: Cardiology

## 2011-06-07 VITALS — BP 118/70 | HR 78 | Ht 66.0 in | Wt 239.0 lb

## 2011-06-07 DIAGNOSIS — Z005 Encounter for examination of potential donor of organ and tissue: Secondary | ICD-10-CM

## 2011-06-07 DIAGNOSIS — Z01818 Encounter for other preprocedural examination: Secondary | ICD-10-CM

## 2011-06-07 DIAGNOSIS — E785 Hyperlipidemia, unspecified: Secondary | ICD-10-CM

## 2011-06-07 DIAGNOSIS — Z0000 Encounter for general adult medical examination without abnormal findings: Secondary | ICD-10-CM | POA: Insufficient documentation

## 2011-06-07 DIAGNOSIS — N289 Disorder of kidney and ureter, unspecified: Secondary | ICD-10-CM

## 2011-06-07 DIAGNOSIS — Z01810 Encounter for preprocedural cardiovascular examination: Secondary | ICD-10-CM

## 2011-06-07 NOTE — Patient Instructions (Signed)
Your physician recommends that you schedule a follow-up appointment in: AS NEEDED PENDING TEST RESULTS  Your physician has requested that you have an echocardiogram. Echocardiography is a painless test that uses sound waves to create images of your heart. It provides your doctor with information about the size and shape of your heart and how well your heart's chambers and valves are working. This procedure takes approximately one hour. There are no restrictions for this procedure.   Your physician has requested that you have en exercise stress myoview. For further information please visit [email or web address] Please follow instruction sheet, as given.

## 2011-06-07 NOTE — Progress Notes (Signed)
   HPI: extremely pleasant 46 year old female with no prior cardiac history for preoperative evaluation prior to renal transplant. Patient does describe dyspnea on exertion but has polycystic kidney disease with cysts that compress her diaphragm. She does not have orthopnea, PND, exertional chest pain or history of syncope. She has had mild pedal edema in the past. She is presently undergoing evaluation for renal transplant and cardiology was asked to evaluate preoperatively.  Current Outpatient Prescriptions  Medication Sig Dispense Refill  . Acetaminophen (TYLENOL PO) Take by mouth as needed.        Marland Kitchen allopurinol (ZYLOPRIM) 100 MG tablet Take 100 mg by mouth 3 (three) times daily.       Marland Kitchen atorvastatin (LIPITOR) 40 MG tablet Take 40 mg by mouth daily.       . calcitRIOL (ROCALTROL) 0.5 MCG capsule Take 0.5 mcg by mouth every other day.       Tery Sanfilippo Calcium (STOOL SOFTENER PO) Take by mouth as needed.      . furosemide (LASIX) 40 MG tablet Take 40 mg by mouth daily.       . Multiple Vitamin (MULTIVITAMIN) capsule Take 1 capsule by mouth daily.        Marland Kitchen omeprazole (PRILOSEC) 20 MG capsule Take 20 mg by mouth daily.        . traMADol (ULTRAM) 50 MG tablet as needed.          Past Medical History  Diagnosis Date  . Eczema   . Psoriasis   . Hypertension   . Enlarged kidney     bilateral  . Chronic kidney disease   . Gout     previously  . Polycystic kidney disease   . Hyperlipidemia     Past Surgical History  Procedure Date  . Cesarean section 1995 and 1997  . Abdominal hysterectomy 2000?    complete  . Hernia repair 2005    umbilical hernia  . Cataract extraction 2011    bilateral  . Peritoneal catheter insertion     History   Social History  . Marital Status: Married    Spouse Name: N/A    Number of Children: 2  . Years of Education: N/A   Occupational History  .      Insurance agent   Social History Main Topics  . Smoking status: Never Smoker   . Smokeless  tobacco: Not on file  . Alcohol Use: No  . Drug Use: No  . Sexually Active:    Other Topics Concern  . Not on file   Social History Narrative  . No narrative on file    ROS: occasional mild hematuria but no fevers or chills, productive cough, hemoptysis, dysphasia, odynophagia, melena, hematochezia, dysuria, hematuria, rash, seizure activity, orthopnea, PND, claudication. Remaining systems are negative.  Physical Exam: Well-developed WM in no acute distress.  Skin is warm and dry.  HEENT is normal. Normal eyelids Neck is supple. No thyromegaly. 2+ carotid upstroke with no bruits. Chest is clear to auscultation with normal expansion.  Cardiovascular exam is regular rate and rhythm. No murmurs rubs or gallops. Abdominal exam significant for peritoneal dialysis catheter. There is mild tenderness to palpation. No masses palpated. 2+ femoral pulses with no bruits. Extremities show varicosities and trace edema. 2+ dorsalis pedis pulses neuro grossly intact  ECG normal sinus rhythm at a rate of 78. Poor R wave progression. Cannot rule out prior anterior infarct.

## 2011-06-07 NOTE — Assessment & Plan Note (Addendum)
Patient is scheduled for renal transplant. Requirements include an echocardiogram and stress Myoview which will be ordered. Patient has a history of hypertension which has resolved with initiation of dialysis. She has mild hyperlipidemia. Otherwise no risk factors. If above normal she may proceed with the transplant.

## 2011-06-07 NOTE — Assessment & Plan Note (Signed)
Continue statin. 

## 2011-06-07 NOTE — Assessment & Plan Note (Signed)
Management per nephrology. 

## 2011-06-08 ENCOUNTER — Ambulatory Visit (HOSPITAL_COMMUNITY): Payer: Managed Care, Other (non HMO) | Attending: Cardiology

## 2011-06-08 ENCOUNTER — Other Ambulatory Visit: Payer: Self-pay

## 2011-06-08 DIAGNOSIS — E785 Hyperlipidemia, unspecified: Secondary | ICD-10-CM | POA: Insufficient documentation

## 2011-06-08 DIAGNOSIS — N189 Chronic kidney disease, unspecified: Secondary | ICD-10-CM | POA: Insufficient documentation

## 2011-06-08 DIAGNOSIS — I517 Cardiomegaly: Secondary | ICD-10-CM | POA: Insufficient documentation

## 2011-06-08 DIAGNOSIS — I129 Hypertensive chronic kidney disease with stage 1 through stage 4 chronic kidney disease, or unspecified chronic kidney disease: Secondary | ICD-10-CM | POA: Insufficient documentation

## 2011-06-08 DIAGNOSIS — Z01818 Encounter for other preprocedural examination: Secondary | ICD-10-CM

## 2011-06-08 DIAGNOSIS — N289 Disorder of kidney and ureter, unspecified: Secondary | ICD-10-CM

## 2011-06-08 DIAGNOSIS — Z01810 Encounter for preprocedural cardiovascular examination: Secondary | ICD-10-CM

## 2011-06-08 DIAGNOSIS — E669 Obesity, unspecified: Secondary | ICD-10-CM | POA: Insufficient documentation

## 2011-06-09 ENCOUNTER — Telehealth: Payer: Self-pay | Admitting: Cardiology

## 2011-06-09 NOTE — Telephone Encounter (Signed)
Fu call °Patient returning your call °

## 2011-06-10 NOTE — Telephone Encounter (Signed)
Spoke with pt, .aware of results.  

## 2011-06-14 ENCOUNTER — Ambulatory Visit (HOSPITAL_COMMUNITY): Payer: Managed Care, Other (non HMO) | Attending: Cardiovascular Disease | Admitting: Radiology

## 2011-06-14 VITALS — BP 107/83 | Ht 66.0 in | Wt 238.0 lb

## 2011-06-14 DIAGNOSIS — E785 Hyperlipidemia, unspecified: Secondary | ICD-10-CM | POA: Insufficient documentation

## 2011-06-14 DIAGNOSIS — Z01818 Encounter for other preprocedural examination: Secondary | ICD-10-CM

## 2011-06-14 DIAGNOSIS — I10 Essential (primary) hypertension: Secondary | ICD-10-CM | POA: Insufficient documentation

## 2011-06-14 DIAGNOSIS — R0609 Other forms of dyspnea: Secondary | ICD-10-CM | POA: Insufficient documentation

## 2011-06-14 DIAGNOSIS — Z01810 Encounter for preprocedural cardiovascular examination: Secondary | ICD-10-CM | POA: Insufficient documentation

## 2011-06-14 DIAGNOSIS — E282 Polycystic ovarian syndrome: Secondary | ICD-10-CM | POA: Insufficient documentation

## 2011-06-14 DIAGNOSIS — R0602 Shortness of breath: Secondary | ICD-10-CM

## 2011-06-14 DIAGNOSIS — R0989 Other specified symptoms and signs involving the circulatory and respiratory systems: Secondary | ICD-10-CM | POA: Insufficient documentation

## 2011-06-14 MED ORDER — TECHNETIUM TC 99M TETROFOSMIN IV KIT
30.0000 | PACK | Freq: Once | INTRAVENOUS | Status: AC | PRN
  Administered 2011-06-14: 30 via INTRAVENOUS

## 2011-06-14 MED ORDER — TECHNETIUM TC 99M TETROFOSMIN IV KIT
10.0000 | PACK | Freq: Once | INTRAVENOUS | Status: AC | PRN
  Administered 2011-06-14: 10 via INTRAVENOUS

## 2011-06-14 NOTE — Progress Notes (Signed)
[facility] SITE 3 NUCLEAR MED [address] [place] [address] [phone]  Cardiology Nuclear Med Study  [name] [name] is a 46 y.o. female     MRN : [id number]     DOB: 05-05-1965  Procedure Date: 06/14/2011  Nuclear Med Background Indication for Stress Test:  Evaluation for Ischemia, and Pending Surgical Clearance for  Renal Transplant by [facility] of [place] [facility]) History:  No cardiac Hx, Polycystic chronic kidney disease Cardiac Risk Factors: Hypertension and Lipids  Symptoms:  DOE and SOB   Nuclear Pre-Procedure Caffeine/Decaff Intake:  None >12 hrs >> 12 hrsNPO After: 6:30pm   Lungs:  clear O2 Sat: 97% on room air. IV 0.9% NS with Angio Cath:  22g  IV Site: R Antecubital x 1, tolerated well IV Started by:  Irean Hong, RN  Chest Size (in):  38 Cup Size: C  Height: 5\' 6"  (1.676 m)  Weight:  238 lb (107.956 kg)  BMI:  Body mass index is 38.41 kg/(m^2). Tech Comments:  n/a    Nuclear Med Study 1 or 2 day study: 1 day  Stress Test Type:  Stress  Reading MD: Charlton Haws, MD  Order Authorizing Provider:  Olga Millers, MD  Resting Radionuclide: Technetium 28m Tetrofosmin  Resting Radionuclide Dose: 11.0 mCi   Stress Radionuclide:  Technetium 62m Tetrofosmin  Stress Radionuclide Dose: 33.0 mCi           Stress Protocol Rest HR: 68 Stress HR: 153  Rest BP: 107/83 Stress BP: 136/71  Exercise Time (min): 4:15 METS: 6.10   Predicted Max HR: 175 bpm % Max HR: 87.43 bpm Rate Pressure Product: 09735   Dose of Adenosine (mg):  n/a Dose of Lexiscan: n/a mg  Dose of Atropine (mg): n/a Dose of Dobutamine: n/a mcg/kg/min (at max HR)  Stress Test Technologist: Milana Na, EMT-P  Nuclear Technologist:  Domenic Polite, CNMT     Rest Procedure:  Myocardial perfusion imaging was performed at rest 45 minutes following the intravenous administration of Technetium 23m Tetrofosmin. Rest ECG: NSR - Normal EKG  Stress Procedure:  The patient  performed treadmill exercise using a Bruce  Protocol for 4:15 minutes. The patient stopped due to sob, fatigue and denied any chest pain.  There were no significant ST-T wave changes.  Technetium 22m Tetrofosmin was injected at peak exercise and myocardial perfusion imaging was performed after a brief delay. Stress ECG: No significant change from baseline ECG  QPS Raw Data Images:  Patient motion noted. Stress Images:  Normal homogeneous uptake in all areas of the myocardium. Rest Images:  Normal homogeneous uptake in all areas of the myocardium. Subtraction (SDS):  Normal Transient Ischemic Dilatation (Normal <1.22):  0.99 Lung/Heart Ratio (Normal <0.45):  0.37  Quantitative Gated Spect Images QGS EDV:  84 ml QGS ESV:  24 ml  Impression Exercise Capacity:  Poor exercise capacity. BP Response:  Normal blood pressure response. Clinical Symptoms:  There is dyspnea. ECG Impression:  No significant ST segment change suggestive of ischemia. Comparison with Prior Nuclear Study: No images to compare  Overall Impression:  Normal stress nuclear study.  LV Ejection Fraction: 72%.  LV Wall Motion:  NL LV Function; NL Wall Motion     Charlton Haws

## 2011-06-30 ENCOUNTER — Other Ambulatory Visit: Payer: Self-pay | Admitting: *Deleted

## 2011-06-30 DIAGNOSIS — Z01818 Encounter for other preprocedural examination: Secondary | ICD-10-CM

## 2011-06-30 DIAGNOSIS — Q619 Cystic kidney disease, unspecified: Secondary | ICD-10-CM

## 2011-06-30 DIAGNOSIS — N186 End stage renal disease: Secondary | ICD-10-CM

## 2011-07-01 ENCOUNTER — Encounter (HOSPITAL_COMMUNITY): Payer: Self-pay | Admitting: *Deleted

## 2011-07-01 ENCOUNTER — Emergency Department (HOSPITAL_COMMUNITY)
Admission: EM | Admit: 2011-07-01 | Discharge: 2011-07-02 | Disposition: A | Discharge status: Home / Self Care | Payer: Managed Care, Other (non HMO) | Attending: Emergency Medicine | Admitting: Emergency Medicine

## 2011-07-01 DIAGNOSIS — N186 End stage renal disease: Secondary | ICD-10-CM | POA: Insufficient documentation

## 2011-07-01 DIAGNOSIS — Z992 Dependence on renal dialysis: Secondary | ICD-10-CM | POA: Insufficient documentation

## 2011-07-01 DIAGNOSIS — Z79899 Other long term (current) drug therapy: Secondary | ICD-10-CM | POA: Insufficient documentation

## 2011-07-01 DIAGNOSIS — Q613 Polycystic kidney, unspecified: Secondary | ICD-10-CM | POA: Insufficient documentation

## 2011-07-01 DIAGNOSIS — N390 Urinary tract infection, site not specified: Secondary | ICD-10-CM | POA: Insufficient documentation

## 2011-07-01 DIAGNOSIS — E785 Hyperlipidemia, unspecified: Secondary | ICD-10-CM | POA: Insufficient documentation

## 2011-07-01 DIAGNOSIS — R109 Unspecified abdominal pain: Secondary | ICD-10-CM | POA: Insufficient documentation

## 2011-07-01 DIAGNOSIS — I120 Hypertensive chronic kidney disease with stage 5 chronic kidney disease or end stage renal disease: Secondary | ICD-10-CM | POA: Insufficient documentation

## 2011-07-01 LAB — DIFFERENTIAL
Basophils Relative: 0 % (ref 0–1)
Eosinophils Absolute: 0.2 10*3/uL (ref 0.0–0.7)
Lymphs Abs: 1.1 10*3/uL (ref 0.7–4.0)
Neutro Abs: 8 10*3/uL — ABNORMAL HIGH (ref 1.7–7.7)
Neutrophils Relative %: 81 % — ABNORMAL HIGH (ref 43–77)

## 2011-07-01 LAB — BASIC METABOLIC PANEL
Chloride: 104 mEq/L (ref 96–112)
GFR calc Af Amer: 20 mL/min — ABNORMAL LOW (ref >90)
GFR calc non Af Amer: 17 mL/min — ABNORMAL LOW (ref >90)
Glucose, Bld: 104 mg/dL — ABNORMAL HIGH (ref 70–99)
Potassium: 2.9 mEq/L — ABNORMAL LOW (ref 3.5–5.1)
Sodium: 143 mEq/L (ref 135–145)

## 2011-07-01 LAB — CBC
Hemoglobin: 14.6 g/dL (ref 12.0–15.0)
MCH: 30.5 pg (ref 26.0–34.0)
Platelets: 202 10*3/uL (ref 150–400)
RBC: 4.79 MIL/uL (ref 3.87–5.11)

## 2011-07-01 MED ORDER — PIPERACILLIN-TAZOBACTAM IN DEX 2-0.25 GM/50ML IV SOLN
2.2500 g | Freq: Once | INTRAVENOUS | Status: AC
  Administered 2011-07-02: 2.25 g via INTRAVENOUS
  Filled 2011-07-01: qty 50

## 2011-07-01 MED ORDER — PIPERACILLIN SOD-TAZOBACTAM SO 2.25 (2-0.25) G IV SOLR: 2.0000 g | Freq: Once | INTRAVENOUS | Status: DC

## 2011-07-01 MED ORDER — ONDANSETRON HCL 4 MG/2ML IJ SOLN
4.0000 mg | Freq: Once | INTRAMUSCULAR | Status: AC
  Administered 2011-07-01: 4 mg via INTRAVENOUS
  Filled 2011-07-01: qty 2

## 2011-07-01 MED ORDER — VANCOMYCIN HCL 1000 MG IV SOLR
1500.0000 mg | Freq: Once | INTRAVENOUS | Status: AC
  Administered 2011-07-02: 1500 mg via INTRAVENOUS
  Filled 2011-07-01: qty 1500

## 2011-07-01 MED ORDER — HYDROMORPHONE HCL PF 1 MG/ML IJ SOLN
1.0000 mg | Freq: Once | INTRAMUSCULAR | Status: AC
  Administered 2011-07-01: 1 mg via INTRAVENOUS
  Filled 2011-07-01: qty 1

## 2011-07-01 NOTE — ED Notes (Signed)
Pt abdomen is reddened. States "I have psoriasis and the tape I use irritates it."

## 2011-07-01 NOTE — ED Notes (Signed)
Pt has hx of peritonitis r/t peritoneal dialysis.  Experiencing the same s/s since 1700 this evening and states the pain is increasing.

## 2011-07-01 NOTE — ED Notes (Signed)
6700 nurse in room to collect PD fluid.

## 2011-07-01 NOTE — Progress Notes (Signed)
1000 ml clear and pale yellow peritoneal fluid drained from pt's PD catheter. No fibrin noted. Labs sent as ordered. Pt tolerated well. No orders for exchanges or fills at this time. Will monitor. C.Cadynce Garrette, RN.

## 2011-07-01 NOTE — ED Notes (Signed)
Pt states she has 1000 cc fluid in currently.

## 2011-07-01 NOTE — ED Provider Notes (Signed)
History     CSN: [id number]  Arrival date & time 07/01/11  2104   First MD Initiated Contact with Patient 07/01/11 2150      Chief Complaint  Patient presents with  . Abdominal Pain    R/T peritoneal dialysis    (Consider location/radiation/quality/duration/timing/severity/associated sxs/prior treatment) HPI Comments: [name] [name] is a 46 y.o. Female who developed shaking, chills, and abdominal pain at 6 PM tonight. She currently has an in dwelling peritoneal dialsylate; that was placed at about 6:00 this morning. She's had no recent illnesses. She denies nausea, vomiting, headache, or weakness. She has chronic low back pain due to her polycystic kidney disease. She continues to make urine about 1 L per day. She denies dysuria, urinary frequency, or hematuria. She denies known aggravating or palliative factors.  Patient is a 46 y.o. female presenting with abdominal pain. The history is provided by the patient.  Abdominal Pain The primary symptoms of the illness include abdominal pain.    Past Medical History  Diagnosis Date  . Eczema   . Psoriasis   . Hypertension   . Enlarged kidney     bilateral  . Chronic kidney disease   . Gout     previously  . Polycystic kidney disease   . Hyperlipidemia     Past Surgical History  Procedure Date  . Cesarean section 1995 and 1997  . Abdominal hysterectomy 2000?    complete  . Hernia repair 2005    umbilical hernia  . Cataract extraction 2011    bilateral  . Peritoneal catheter insertion     Family History  Problem Relation Age of Onset  . Cancer Sister     cancer that developed in the fatty tissues of the muscle    History  Substance Use Topics  . Smoking status: Never Smoker   . Smokeless tobacco: Not on file  . Alcohol Use: No    OB History    Grav Para Term Preterm Abortions TAB SAB Ect Mult Living                  Review of Systems  Gastrointestinal: Positive for abdominal pain.  All other systems  reviewed and are negative.    Allergies  Review of patient's allergies indicates no known allergies.  Home Medications   Current Outpatient Rx  Name Route Sig Dispense Refill  . ALLOPURINOL 100 MG PO TABS Oral Take 100 mg by mouth 3 (three) times daily.     . ATORVASTATIN CALCIUM 40 MG PO TABS Oral Take 40 mg by mouth daily.     Marland Kitchen CALCITRIOL 0.5 MCG PO CAPS Oral Take 0.5 mcg by mouth every other day.     . STOOL SOFTENER PO Oral Take 100 mg by mouth 2 (two) times daily as needed. For constipation    . FUROSEMIDE 40 MG PO TABS Oral Take 40 mg by mouth 2 (two) times daily.     . MULTIVITAMINS PO CAPS Oral Take 1 capsule by mouth daily.      Marland Kitchen OMEPRAZOLE 20 MG PO CPDR Oral Take 20 mg by mouth daily.      . TRAMADOL HCL 50 MG PO TABS Oral Take 50 mg by mouth 2 (two) times daily as needed. For pain    . OXYCODONE-ACETAMINOPHEN 5-325 MG PO TABS Oral Take 1 tablet by mouth every 4 (four) hours as needed for pain. 20 tablet 0    BP 111/60  Pulse 69  Temp 98.7 F (  37.1 C) (Oral)  Resp 18  SpO2 98%  Physical Exam  Nursing note and vitals reviewed. Constitutional: She is oriented to person, place, and time. She appears well-developed and well-nourished.  HENT:  Head: Normocephalic and atraumatic.  Eyes: Conjunctivae and EOM are normal. Pupils are equal, round, and reactive to light.  Neck: Normal range of motion and phonation normal. Neck supple.  Cardiovascular: Normal rate, regular rhythm and intact distal pulses.   Pulmonary/Chest: Effort normal and breath sounds normal. She exhibits no tenderness.  Abdominal: Soft. She exhibits no distension and no mass. There is tenderness (moderate diffuse tenderness in the upper quadrants bilaterally). There is no rebound and no guarding.       Dialysis catheter left upper quadrant  Musculoskeletal: Normal range of motion.  Neurological: She is alert and oriented to person, place, and time. She has normal strength. She exhibits normal muscle  tone.  Skin: Skin is warm and dry.  Psychiatric: She has a normal mood and affect. Her behavior is normal. Judgment and thought content normal.    ED Course  Procedures (including critical care time)  Case discussed with her nephrologist. She recommended treating the patient with Zosyn, and vancomycin. She will followup with the cell counts, and culture results from the urine and peritoneal fluid Patient received several doses of narcotic pain medicine in the ED. She's given an oral dose.   Labs Reviewed  DIFFERENTIAL - Abnormal; Notable for the following:    Neutrophils Relative 81 (*)     Neutro Abs 8.0 (*)     All other components within normal limits  BASIC METABOLIC PANEL - Abnormal; Notable for the following:    Potassium 2.9 (*)     Glucose, Bld 104 (*)     BUN 33 (*)     Creatinine, Ser 3.09 (*)     GFR calc non Af Amer 17 (*)     GFR calc Af Amer 20 (*)     All other components within normal limits  URINALYSIS, ROUTINE W REFLEX MICROSCOPIC - Abnormal; Notable for the following:    APPearance CLOUDY (*)     Hgb urine dipstick SMALL (*)     Leukocytes, UA TRACE (*)     All other components within normal limits  LACTATE DEHYDROGENASE, BODY FLUID - Abnormal; Notable for the following:    LD, Fluid 30 (*)     All other components within normal limits  BODY FLUID CELL COUNT WITH DIFFERENTIAL - Abnormal; Notable for the following:    Color, Fluid STRAW (*)     All other components within normal limits  URINE MICROSCOPIC-ADD ON - Abnormal; Notable for the following:    Squamous Epithelial / LPF MANY (*)     Bacteria, UA FEW (*)     All other components within normal limits  CBC  PROTEIN, BODY FLUID  ALBUMIN, FLUID  GRAM STAIN  GLUCOSE, SEROUS FLUID  URINE CULTURE  CULTURE, BLOOD (ROUTINE X 2)  CULTURE, BLOOD (ROUTINE X 2)  GLUCOSE, PERITONEAL FLUID  BODY FLUID CULTURE   No results found.   1. UTI (lower urinary tract infection)   2. End stage renal disease        MDM  Abdominal pain, likely from urinary tract infection. Doubt peritonitis. Patient has been covered for bacterial peritonitis, as well as urinary tract infection. She is stable for discharge with single antibiotic dose treatment.    Plan: Home Medications- Percocet; Home Treatments- push fluids; Recommended follow up- f/u  Nephrology in 2 days for results and further treatment as needed.       Flint Melter, MD 07/02/11 [phone]

## 2011-07-02 LAB — BODY FLUID CELL COUNT WITH DIFFERENTIAL: Total Nucleated Cell Count, Fluid: 20 cu mm (ref 0–1000)

## 2011-07-02 LAB — GRAM STAIN: Special Requests: NORMAL

## 2011-07-02 LAB — GLUCOSE, SEROUS FLUID: Glucose, Fluid: 155 mg/dL

## 2011-07-02 LAB — URINALYSIS, ROUTINE W REFLEX MICROSCOPIC
Nitrite: NEGATIVE
Specific Gravity, Urine: 1.011 (ref 1.005–1.030)
Urobilinogen, UA: 0.2 mg/dL (ref 0.0–1.0)
pH: 5.5 (ref 5.0–8.0)

## 2011-07-02 LAB — ALBUMIN, FLUID (OTHER): Albumin, Fluid: 0.3 g/dL

## 2011-07-02 LAB — LACTATE DEHYDROGENASE, PLEURAL OR PERITONEAL FLUID: LD, Fluid: 30 U/L — ABNORMAL HIGH (ref 3–23)

## 2011-07-02 LAB — URINE MICROSCOPIC-ADD ON

## 2011-07-02 MED ORDER — HYDROMORPHONE HCL PF 1 MG/ML IJ SOLN
1.0000 mg | Freq: Once | INTRAMUSCULAR | Status: AC
  Administered 2011-07-02: 1 mg via INTRAVENOUS
  Filled 2011-07-02: qty 1

## 2011-07-02 MED ORDER — DIPHENHYDRAMINE HCL 50 MG/ML IJ SOLN
12.5000 mg | Freq: Once | INTRAMUSCULAR | Status: AC
  Administered 2011-07-02: 12.5 mg via INTRAVENOUS
  Filled 2011-07-02: qty 1

## 2011-07-02 MED ORDER — OXYCODONE-ACETAMINOPHEN 5-325 MG PO TABS
1.0000 | ORAL_TABLET | Freq: Once | ORAL | Status: AC
  Administered 2011-07-02: 1 via ORAL
  Filled 2011-07-02: qty 1

## 2011-07-02 MED ORDER — OXYCODONE-ACETAMINOPHEN 5-325 MG PO TABS: 1.0000 | ORAL_TABLET | ORAL | Status: AC | PRN

## 2011-07-02 NOTE — ED Notes (Signed)
Vanc. Almost complete.

## 2011-07-02 NOTE — ED Provider Notes (Signed)
  5:11 AM- pt had mild rash develop halfway through her vanc- she had no respiratory symptoms.  Benadryl was given and vanc infusion slowed.  Rash resolved.  She finished infusion without any further symptoms.  Pt discharged per instructions written by Dr. Daneil Dolin, MD 07/02/11 [phone]

## 2011-07-02 NOTE — ED Notes (Signed)
Called to pt room re: itching all over. Pt's face/hands flushed. NAD. O2 Sats 94-97% on RA. First hour of Vanc in and pt tolerated well. Dr. Karma Ganja updated and orders for Benadryl received & continue Vanc. Rate of Vanc decreased to 146ml/hr.

## 2011-07-02 NOTE — Discharge Instructions (Signed)
End Stage Kidney Disease End-stage kidney disease occurs when your kidneys no longer work well enough to support day-to-day life. It usually occurs when longstanding (chronic) kidney failure gets worse, to the point where kidney function is less than 10% of normal. At this point, the kidney function is so low that death will occur from buildup of fluids and waste products in the body. The most common cause of kidney failure is diabetes. Kidney failure is very common. ESRD (End Stage Renal Disease) almost always follows chronic kidney failure or renal insufficiency. This condition may exist for 10 to 20 years or more before developing into ESRD. SYMPTOMS   Unintentional weight loss.   Fatigue, anemia.   Generalized itching (pruritus).   Easy bruising or bleeding.   Drowsiness, lethargy.   Muscle twitching or cramps.   Skin may appear yellow or brown.   General ill feeling.   Frequent hiccups.   No or decreased urine output.   Decreased alertness.   Coma.   Increased skin pigmentation.   Decreased sensation in the hands, feet, or other areas.  DIAGNOSIS  Your caregiver will be able to tell what is wrong by talking to you, doing an examination, and doing laboratory tests. The blood work and urinalysis will show that your kidneys are not working well enough. TREATMENT  Dialysis or kidney transplantation are the only treatments for ESRD. Your health, age and other factors determine which treatment is best. Other treatments for chronic renal failure should continue. Associated diseases that cause kidney failure should be controlled. Some of these are:  Hypertension.   Kidney stones.   Heart failure.   Obstructions of the urinary tract.   Urinary tract infections.   Glomerulonephritis.  PROGNOSIS  ESRD is fatal unless treated with dialysis or transplantation. Both of these treatments can have serious risks and consequences. The outcome varies and is unique to each  individual. RISKS AND COMPLICATIONS Complications of dialysis and kidney transplantation:  Hypertension (kidneys try to raise blood pressure so they can work better).   Platelet dysfunction (cells in blood which help with clotting are defective).   Gastrointestinal loss of blood, duodenal or peptic ulcers.   Hemorrhage (bleeding problems).   Anemia (not enough blood cells are produced).   Hepatitis B, hepatitis C, liver failure (exposure may occur during dialysis).   Infection from decreased operation of white blood cells and immune system.   Multiple cancers form, from long-term immunosuppressant use.   Peripheral neuropathy (damage to your nerves).   Seizures (convulsions).   Encephalopathy, nervous system damage, dementia (changes in your brain).   Weakening of the bones, fractures, joint disorders, joint replacements are common.   Permanent skin pigmentation changes.   Skin dryness, itching, scratching resulting in skin infection from hydration problems.   Changes in glucose metabolism.   Changes in electrolyte levels (salts in your blood).   Decreased libido, impotence (loss of interest in sex or ability to function well).   Miscarriage, menstrual irregularities, infertility.   Pericarditis (inflammation of the lining surrounding the heart).   Cardiac tamponade (fluid collection around the heart).   Heart failure in which your heart cannot pump well enough to keep up with the work.  PREVENTION  Treatment of the causes of longstanding kidney failure may delay or prevent progression to ESRD. For example, diabetes which is under strict control is less likely to cause renal failure than diabetes which is left untreated. Some causes of renal failure cannot be treated. Document Released: 03/19/2003 Document   Revised: 12/16/2010 Document Reviewed: 12/27/2004 [facility] Patient Information 2012 [place], [place].Urinary Tract Infection Infections of the urinary tract can  start in several places. A bladder infection (cystitis), a kidney infection (pyelonephritis), and a prostate infection (prostatitis) are different types of urinary tract infections (UTIs). They usually get better if treated with medicines (antibiotics) that kill germs. Take all the medicine until it is gone. You or your child may feel better in a few days, but TAKE ALL MEDICINE or the infection may not respond and may become more difficult to treat. HOME CARE INSTRUCTIONS   Drink enough water and fluids to keep the urine clear or pale yellow. Cranberry juice is especially recommended, in addition to large amounts of water.   Avoid caffeine, tea, and carbonated beverages. They tend to irritate the bladder.   Alcohol may irritate the prostate.   Only take over-the-counter or prescription medicines for pain, discomfort, or fever as directed by your caregiver.  To prevent further infections:  Empty the bladder often. Avoid holding urine for long periods of time.   After a bowel movement, women should cleanse from front to back. Use each tissue only once.   Empty the bladder before and after sexual intercourse.  FINDING OUT THE RESULTS OF YOUR TEST Not all test results are available during your visit. If your or your child's test results are not back during the visit, make an appointment with your caregiver to find out the results. Do not assume everything is normal if you have not heard from your caregiver or the medical facility. It is important for you to follow up on all test results. SEEK MEDICAL CARE IF:   There is back pain.   Your baby is older than 3 months with a rectal temperature of 100.5 F (38.1 C) or higher for more than 1 day.   Your or your child's problems (symptoms) are no better in 3 days. Return sooner if you or your child is getting worse.  SEEK IMMEDIATE MEDICAL CARE IF:   There is severe back pain or lower abdominal pain.   You or your child develops chills.   You  have a fever.   Your baby is older than 3 months with a rectal temperature of 102 F (38.9 C) or higher.   Your baby is 64 months old or younger with a rectal temperature of 100.4 F (38 C) or higher.   There is nausea or vomiting.   There is continued burning or discomfort with urination.  MAKE SURE YOU:   Understand these instructions.   Will watch your condition.   Will get help right away if you are not doing well or get worse.  Document Released: 10/06/2004 Document Revised: 12/16/2010 Document Reviewed: 05/11/2006 [facility] Patient Information 2012 [place], [place].

## 2011-07-03 ENCOUNTER — Encounter (HOSPITAL_COMMUNITY): Payer: Self-pay | Admitting: Emergency Medicine

## 2011-07-03 ENCOUNTER — Inpatient Hospital Stay (HOSPITAL_COMMUNITY)
Admission: EM | Admit: 2011-07-03 | Discharge: 2011-07-07 | DRG: 329 | Disposition: A | Discharge status: Home / Self Care | Payer: Managed Care, Other (non HMO) | Attending: Family Medicine | Admitting: Family Medicine

## 2011-07-03 DIAGNOSIS — D649 Anemia, unspecified: Secondary | ICD-10-CM | POA: Diagnosis present

## 2011-07-03 DIAGNOSIS — K436 Other and unspecified ventral hernia with obstruction, without gangrene: Secondary | ICD-10-CM

## 2011-07-03 DIAGNOSIS — I120 Hypertensive chronic kidney disease with stage 5 chronic kidney disease or end stage renal disease: Secondary | ICD-10-CM | POA: Diagnosis present

## 2011-07-03 DIAGNOSIS — N186 End stage renal disease: Secondary | ICD-10-CM | POA: Diagnosis present

## 2011-07-03 DIAGNOSIS — K56609 Unspecified intestinal obstruction, unspecified as to partial versus complete obstruction: Secondary | ICD-10-CM

## 2011-07-03 DIAGNOSIS — E876 Hypokalemia: Secondary | ICD-10-CM | POA: Diagnosis not present

## 2011-07-03 DIAGNOSIS — R109 Unspecified abdominal pain: Secondary | ICD-10-CM

## 2011-07-03 DIAGNOSIS — E785 Hyperlipidemia, unspecified: Secondary | ICD-10-CM | POA: Diagnosis present

## 2011-07-03 DIAGNOSIS — Z90710 Acquired absence of both cervix and uterus: Secondary | ICD-10-CM

## 2011-07-03 DIAGNOSIS — Q613 Polycystic kidney, unspecified: Secondary | ICD-10-CM

## 2011-07-03 LAB — COMPREHENSIVE METABOLIC PANEL
BUN: 26 mg/dL — ABNORMAL HIGH (ref 6–23)
CO2: 28 mEq/L (ref 19–32)
Chloride: 101 mEq/L (ref 96–112)
Creatinine, Ser: 3.49 mg/dL — ABNORMAL HIGH (ref 0.50–1.10)
GFR calc non Af Amer: 15 mL/min — ABNORMAL LOW (ref >90)
Total Bilirubin: 0.4 mg/dL (ref 0.3–1.2)

## 2011-07-03 LAB — DIFFERENTIAL
Basophils Relative: 0 % (ref 0–1)
Monocytes Absolute: 0.6 10*3/uL (ref 0.1–1.0)
Monocytes Relative: 7 % (ref 3–12)
Neutro Abs: 6.3 10*3/uL (ref 1.7–7.7)

## 2011-07-03 LAB — URINE CULTURE: Special Requests: NORMAL

## 2011-07-03 LAB — CBC
HCT: 44.1 % (ref 36.0–46.0)
Hemoglobin: 14.6 g/dL (ref 12.0–15.0)
MCHC: 33.1 g/dL (ref 30.0–36.0)
MCV: 91.9 fL (ref 78.0–100.0)

## 2011-07-03 LAB — LACTATE DEHYDROGENASE, PLEURAL OR PERITONEAL FLUID: LD, Fluid: 29 U/L — ABNORMAL HIGH (ref 3–23)

## 2011-07-03 LAB — BODY FLUID CELL COUNT WITH DIFFERENTIAL: Eos, Fluid: 6 %

## 2011-07-03 MED ORDER — HYDROMORPHONE HCL PF 1 MG/ML IJ SOLN
1.0000 mg | Freq: Once | INTRAMUSCULAR | Status: AC
  Administered 2011-07-03: 1 mg via INTRAVENOUS
  Filled 2011-07-03: qty 1

## 2011-07-03 MED ORDER — ONDANSETRON HCL 4 MG/2ML IJ SOLN
INTRAMUSCULAR | Status: AC
  Filled 2011-07-03: qty 2

## 2011-07-03 MED ORDER — ONDANSETRON HCL 4 MG/2ML IJ SOLN
4.0000 mg | Freq: Once | INTRAMUSCULAR | Status: AC
  Administered 2011-07-03: 4 mg via INTRAVENOUS
  Filled 2011-07-03: qty 2

## 2011-07-03 MED ORDER — ONDANSETRON HCL 4 MG/2ML IJ SOLN
4.0000 mg | Freq: Once | INTRAMUSCULAR | Status: AC
  Administered 2011-07-03: 4 mg via INTRAVENOUS

## 2011-07-03 NOTE — ED Notes (Signed)
Pt here from home, pt is PD patient and was seen her last week for uti, last pm and today noted to have abd pain and per pt visitor "fever for her" because she usually runs low 90's. Pt sts she thinks it is peritonitis.

## 2011-07-03 NOTE — ED Notes (Signed)
6700 aware of need for peritoneal fluid, and they will send staff for same.

## 2011-07-03 NOTE — ED Provider Notes (Signed)
History     CSN: [id number]  Arrival date & time 07/03/11  1638   First MD Initiated Contact with Patient 07/03/11 1753      Chief Complaint  Patient presents with  . Emesis    Patient is a 46 y.o. female presenting with abdominal pain. The history is provided by the patient and the spouse.  Abdominal Pain The primary symptoms of the illness include abdominal pain. The primary symptoms of the illness do not include fever, shortness of breath, vomiting, diarrhea or dysuria. The current episode started more than 2 days ago. The onset of the illness was gradual. The problem has not changed since onset. Associated with: hx of polycystic renal disease, awaiting renal transplant; on dialysis. The patient states that she believes she is currently not pregnant. The patient has not had a change in bowel habit. Symptoms associated with the illness do not include chills or constipation.    Past Medical History  Diagnosis Date  . Eczema   . Psoriasis   . Hypertension   . Enlarged kidney     bilateral  . Chronic kidney disease   . Gout     previously  . Polycystic kidney disease   . Hyperlipidemia     Past Surgical History  Procedure Date  . Cesarean section 1995 and 1997  . Abdominal hysterectomy 2000?    complete  . Hernia repair 2005    umbilical hernia  . Cataract extraction 2011    bilateral  . Peritoneal catheter insertion     Family History  Problem Relation Age of Onset  . Cancer Sister     cancer that developed in the fatty tissues of the muscle    History  Substance Use Topics  . Smoking status: Never Smoker   . Smokeless tobacco: Not on file  . Alcohol Use: No    OB History    Grav Para Term Preterm Abortions TAB SAB Ect Mult Living                  Review of Systems  Constitutional: Negative for fever, chills, activity change and appetite change.  HENT: Negative for neck pain and neck stiffness.   Respiratory: Negative for cough, chest tightness,  shortness of breath and wheezing.   Cardiovascular: Negative for chest pain and palpitations.  Gastrointestinal: Positive for abdominal pain and abdominal distention. Negative for vomiting, diarrhea and constipation.  Genitourinary: Positive for difficulty urinating (at baseline). Negative for dysuria and decreased urine volume.  Skin: Negative for rash and wound.  Neurological: Negative for seizures, syncope, facial asymmetry and light-headedness.  Psychiatric/Behavioral: Negative for confusion and agitation.  All other systems reviewed and are negative.    Allergies  Review of patient's allergies indicates no known allergies.  Home Medications   Current Outpatient Rx  Name Route Sig Dispense Refill  . ALLOPURINOL 100 MG PO TABS Oral Take 100 mg by mouth 3 (three) times daily.     . ATORVASTATIN CALCIUM 40 MG PO TABS Oral Take 40 mg by mouth daily.     Marland Kitchen CALCITRIOL 0.5 MCG PO CAPS Oral Take 0.5 mcg by mouth every other day.     . STOOL SOFTENER PO Oral Take 100 mg by mouth 2 (two) times daily as needed. For constipation    . FUROSEMIDE 40 MG PO TABS Oral Take 40 mg by mouth 2 (two) times daily.     . MULTIVITAMINS PO CAPS Oral Take 1 capsule by mouth daily.      Marland Kitchen  OMEPRAZOLE 20 MG PO CPDR Oral Take 20 mg by mouth daily.      . OXYCODONE-ACETAMINOPHEN 5-325 MG PO TABS Oral Take 1 tablet by mouth every 4 (four) hours as needed for pain. 20 tablet 0  . TRAMADOL HCL 50 MG PO TABS Oral Take 50 mg by mouth 2 (two) times daily as needed. For pain      BP 112/85  Pulse 95  Temp 98.7 F (37.1 C) (Oral)  SpO2 97%  Physical Exam  Nursing note and vitals reviewed. Constitutional: She is oriented to person, place, and time. She appears well-developed and well-nourished.  HENT:  Head: Normocephalic and atraumatic.  Right Ear: External ear normal.  Left Ear: External ear normal.  Nose: Nose normal.  Mouth/Throat: Oropharynx is clear and moist. No oropharyngeal exudate.  Eyes:  Conjunctivae are normal. Pupils are equal, round, and reactive to light.  Neck: Normal range of motion. Neck supple.  Cardiovascular: Normal rate, regular rhythm, normal heart sounds and intact distal pulses.  Exam reveals no gallop and no friction rub.   No murmur heard. Pulmonary/Chest: Effort normal and breath sounds normal. No respiratory distress. She has no wheezes. She has no rales. She exhibits no tenderness.  Abdominal: Soft. Bowel sounds are normal. She exhibits distension. She exhibits no mass. There is tenderness (moderate TTP overlying bilateral kidneys). There is no rebound and no guarding.       PD catheter draining from left upper abdomen  Musculoskeletal: Normal range of motion. She exhibits no edema and no tenderness.  Neurological: She is alert and oriented to person, place, and time.  Skin: Skin is warm and dry.  Psychiatric: She has a normal mood and affect. Her behavior is normal. Judgment and thought content normal.    ED Course  Procedures (including critical care time)  Labs Reviewed  COMPREHENSIVE METABOLIC PANEL - Abnormal; Notable for the following:    Potassium 3.1 (*)     Glucose, Bld 106 (*)     BUN 26 (*)     Creatinine, Ser 3.49 (*)     Albumin 3.4 (*)     GFR calc non Af Amer 15 (*)     GFR calc Af Amer 17 (*)     All other components within normal limits  LACTATE DEHYDROGENASE, BODY FLUID - Abnormal; Notable for the following:    LD, Fluid 29 (*)     All other components within normal limits  BODY FLUID CELL COUNT WITH DIFFERENTIAL - Abnormal; Notable for the following:    Color, Fluid STRAW (*)     Monocyte-Macrophage-Serous Fluid 37 (*)     All other components within normal limits  CBC  DIFFERENTIAL  GLUCOSE, PERITONEAL FLUID  PROTEIN, BODY FLUID  ALBUMIN, FLUID  BODY FLUID CULTURE   No results found.   1. 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 Peritoneal fluid diasylate has been clear. AFVSS. Pt is having tenderness overlying her kidneys, similar to her baseline pain/tenderness with polycystic kidney disease. No obvious fluid wave on exam. Will obtain labs and peritoneal fluid again to rule out peritonitis. While awaiting labs, care transferred to [facility] who is apprised of course and plan.          Clemetine Marker, MD 07/04/11 [phone]

## 2011-07-03 NOTE — ED Notes (Signed)
Pt was seen here yesterday and dx with UTI was given IV antibiotics and fluids and discharged home.  Pt is end stage kidney failure pt waiting for transplant.  Pt continues to have abd. Pain with nausea and vomiting.  C/o feeling weak.  Husband concerned that pt may have peritonitis.

## 2011-07-03 NOTE — ED Provider Notes (Signed)
History     CSN: [id number]  Arrival date & time 07/03/11  1638   First MD Initiated Contact with Patient 07/03/11 1753      Chief Complaint  Patient presents with  . Emesis    (Consider location/radiation/quality/duration/timing/severity/associated sxs/prior treatment) HPI  Past Medical History  Diagnosis Date  . Eczema   . Psoriasis   . Hypertension   . Enlarged kidney     bilateral  . Chronic kidney disease   . Gout     previously  . Polycystic kidney disease   . Hyperlipidemia     Past Surgical History  Procedure Date  . Cesarean section 1995 and 1997  . Abdominal hysterectomy 2000?    complete  . Hernia repair 2005    umbilical hernia  . Cataract extraction 2011    bilateral  . Peritoneal catheter insertion     Family History  Problem Relation Age of Onset  . Cancer Sister     cancer that developed in the fatty tissues of the muscle    History  Substance Use Topics  . Smoking status: Never Smoker   . Smokeless tobacco: Not on file  . Alcohol Use: No    OB History    Grav Para Term Preterm Abortions TAB SAB Ect Mult Living                  Review of Systems  Allergies  Review of patient's allergies indicates no known allergies.  Home Medications   Current Outpatient Rx  Name Route Sig Dispense Refill  . ALLOPURINOL 100 MG PO TABS Oral Take 100 mg by mouth 3 (three) times daily.     . ATORVASTATIN CALCIUM 40 MG PO TABS Oral Take 40 mg by mouth daily.     Marland Kitchen CALCITRIOL 0.5 MCG PO CAPS Oral Take 0.5 mcg by mouth every other day.     . STOOL SOFTENER PO Oral Take 100 mg by mouth 2 (two) times daily as needed. For constipation    . FUROSEMIDE 40 MG PO TABS Oral Take 40 mg by mouth 2 (two) times daily.     . MULTIVITAMINS PO CAPS Oral Take 1 capsule by mouth daily.      Marland Kitchen OMEPRAZOLE 20 MG PO CPDR Oral Take 20 mg by mouth daily.      . OXYCODONE-ACETAMINOPHEN 5-325 MG PO TABS Oral Take 1 tablet by mouth every 4 (four) hours as needed for pain.  20 tablet 0  . TRAMADOL HCL 50 MG PO TABS Oral Take 50 mg by mouth 2 (two) times daily as needed. For pain      BP 130/73  Pulse 76  Temp 98.7 F (37.1 C) (Oral)  Resp 14  SpO2 97%  Physical Exam  ED Course  Procedures (including critical care time)  Labs Reviewed  COMPREHENSIVE METABOLIC PANEL - Abnormal; Notable for the following:    Potassium 3.1 (*)     Glucose, Bld 106 (*)     BUN 26 (*)     Creatinine, Ser 3.49 (*)     Albumin 3.4 (*)     GFR calc non Af Amer 15 (*)     GFR calc Af Amer 17 (*)     All other components within normal limits  CBC  DIFFERENTIAL  LACTATE DEHYDROGENASE, BODY FLUID  GLUCOSE, PERITONEAL FLUID  PROTEIN, BODY FLUID  ALBUMIN, FLUID  BODY FLUID CULTURE  BODY FLUID CELL COUNT WITH DIFFERENTIAL   No results found.  No diagnosis found.   10:36 PM Patient from Pod B/D, handoff from Dr. Weldon Inches and Dr. Aundria Rud. Patient h/o ESRD 2/2 polycystic kidney disease, on peritoneal dialysis -- presents with generalized abdominal pain for several days. Was seen in ED 2 days ago and discharged after lab work reassuring, no evidence of peritonitis. She returns for same. TOo CDU awaiting labs, will work to control pain.   Vital signs reviewed and are as follows: Filed Vitals:   07/03/11 2035  BP: 130/73  Pulse: 76  Temp:   Resp: 14  BP 130/73  Pulse 76  Temp 98.7 F (37.1 C) (Oral)  Resp 14  SpO2 97%  Some relief earlier with dilaudid and zofran.   10:35 PM Exam: Gen NAD; Heart RRR, nml S1,S2, no m/r/g; Lungs CTAB; Abd soft, generalized tenderness, no rebound or guarding; Ext 2+ pedal pulses bilaterally, no edema.  11:30 PM Continuing to await labs.   Handoff to Dr. Lafayette Dragon. Rancour.   Labs will need reviewed and patient will need re-exam. Possible consult with nephrologist. Additional pain medicine ordered earlier.   MDM  ESRD, peritoneal dialysis, abdominal pain, work-up pending.         [place], [place] 07/03/11 [phone]

## 2011-07-03 NOTE — ED Notes (Signed)
[id number], RN in room performing Peritoneal Dialysis

## 2011-07-03 NOTE — ED Provider Notes (Signed)
I saw and evaluated the patient, reviewed the resident's note and I agree with the findings and plan. Pt has history of peritoneal dialysis.  She does exchange is every night.  She presents to emergency department complaining of abdominal pain, and flank pain.  This is associated with nausea, but no vomiting.  She has not had a fever.  She denies cough, or shortness of breath.  She states that she still makes about1  thousand cc of urine daily.   She denies rash, or diarrhea.  She was seen in the emergency department.  Last night.  Her dialysate was tested and there was no signs of peritonitis.  She was given antibiotics for presumed urinary tract infection, and then released.  Her symptoms recurred, so, she is return to the emergency department for evaluation.  On examination.  She is obese, in no distress.  Her heart and lungs are normal.  She has diffuse, mild abdominal tenderness, without peritoneal signs.  We will repeat laboratory testing, including dialysate, blood, and urinalysis and then, we will consult with nephrology for further evaluation  Cheri Guppy, MD 07/03/11 1932

## 2011-07-04 ENCOUNTER — Encounter (HOSPITAL_COMMUNITY): Payer: Self-pay | Admitting: Anesthesiology

## 2011-07-04 ENCOUNTER — Emergency Department (HOSPITAL_COMMUNITY): Payer: Managed Care, Other (non HMO)

## 2011-07-04 ENCOUNTER — Encounter (HOSPITAL_COMMUNITY): Payer: Self-pay | Admitting: Nephrology

## 2011-07-04 ENCOUNTER — Encounter (HOSPITAL_COMMUNITY)
Admission: EM | Disposition: A | Discharge status: Home / Self Care | Payer: Self-pay | Source: Home / Self Care | Attending: Internal Medicine

## 2011-07-04 ENCOUNTER — Encounter (HOSPITAL_COMMUNITY): Payer: Self-pay

## 2011-07-04 ENCOUNTER — Inpatient Hospital Stay (HOSPITAL_COMMUNITY): Payer: Managed Care, Other (non HMO) | Admitting: Anesthesiology

## 2011-07-04 DIAGNOSIS — R109 Unspecified abdominal pain: Secondary | ICD-10-CM

## 2011-07-04 DIAGNOSIS — N186 End stage renal disease: Secondary | ICD-10-CM | POA: Diagnosis present

## 2011-07-04 DIAGNOSIS — K56609 Unspecified intestinal obstruction, unspecified as to partial versus complete obstruction: Secondary | ICD-10-CM

## 2011-07-04 HISTORY — PX: VENTRAL HERNIA REPAIR: SHX424

## 2011-07-04 LAB — CBC
MCV: 91.8 fL (ref 78.0–100.0)
Platelets: 179 10*3/uL (ref 150–400)
RBC: 4.37 MIL/uL (ref 3.87–5.11)
RDW: 13.3 % (ref 11.5–15.5)
WBC: 6.6 10*3/uL (ref 4.0–10.5)

## 2011-07-04 LAB — COMPREHENSIVE METABOLIC PANEL
Alkaline Phosphatase: 58 U/L (ref 39–117)
BUN: 27 mg/dL — ABNORMAL HIGH (ref 6–23)
CO2: 26 mEq/L (ref 19–32)
Chloride: 103 mEq/L (ref 96–112)
Creatinine, Ser: 3.68 mg/dL — ABNORMAL HIGH (ref 0.50–1.10)
GFR calc Af Amer: 16 mL/min — ABNORMAL LOW (ref >90)
GFR calc non Af Amer: 14 mL/min — ABNORMAL LOW (ref >90)
Glucose, Bld: 101 mg/dL — ABNORMAL HIGH (ref 70–99)
Potassium: 3 mEq/L — ABNORMAL LOW (ref 3.5–5.1)
Total Bilirubin: 0.4 mg/dL (ref 0.3–1.2)

## 2011-07-04 LAB — GLUCOSE, CAPILLARY
Glucose-Capillary: 101 mg/dL — ABNORMAL HIGH (ref 70–99)
Glucose-Capillary: 76 mg/dL (ref 70–99)
Glucose-Capillary: 87 mg/dL (ref 70–99)

## 2011-07-04 LAB — URINALYSIS, ROUTINE W REFLEX MICROSCOPIC
Bilirubin Urine: NEGATIVE
Glucose, UA: NEGATIVE mg/dL
Hgb urine dipstick: NEGATIVE
Specific Gravity, Urine: 1.028 (ref 1.005–1.030)
pH: 5.5 (ref 5.0–8.0)

## 2011-07-04 LAB — PHOSPHORUS: Phosphorus: 4.7 mg/dL — ABNORMAL HIGH (ref 2.3–4.6)

## 2011-07-04 SURGERY — REPAIR, HERNIA, VENTRAL
Anesthesia: General | Site: Abdomen | Wound class: Clean

## 2011-07-04 MED ORDER — ONDANSETRON HCL 4 MG/2ML IJ SOLN: 4.0000 mg | Freq: Three times a day (TID) | INTRAMUSCULAR | Status: DC | PRN

## 2011-07-04 MED ORDER — ACETAMINOPHEN 650 MG RE SUPP: 650.0000 mg | Freq: Four times a day (QID) | RECTAL | Status: DC | PRN

## 2011-07-04 MED ORDER — WHITE PETROLATUM GEL
Status: AC
  Administered 2011-07-04: 18:00:00
  Filled 2011-07-04: qty 5

## 2011-07-04 MED ORDER — BUPIVACAINE-EPINEPHRINE 0.25% -1:200000 IJ SOLN
INTRAMUSCULAR | Status: DC | PRN
  Administered 2011-07-04: 20 mL

## 2011-07-04 MED ORDER — FENTANYL CITRATE 0.05 MG/ML IJ SOLN
INTRAMUSCULAR | Status: DC | PRN
  Administered 2011-07-04 (×2): 25 ug via INTRAVENOUS
  Administered 2011-07-04: 100 ug via INTRAVENOUS

## 2011-07-04 MED ORDER — DEXTROSE 5 % IV SOLN
1.0000 g | Freq: Once | INTRAVENOUS | Status: AC
  Administered 2011-07-04: 1 g via INTRAVENOUS
  Filled 2011-07-04: qty 1

## 2011-07-04 MED ORDER — MENTHOL 3 MG MT LOZG
1.0000 | LOZENGE | OROMUCOSAL | Status: DC | PRN
  Administered 2011-07-04 (×2): 3 mg via ORAL
  Filled 2011-07-04 (×2): qty 9

## 2011-07-04 MED ORDER — IOHEXOL 300 MG/ML  SOLN
20.0000 mL | INTRAMUSCULAR | Status: DC
  Administered 2011-07-04: 20 mL via INTRAVENOUS

## 2011-07-04 MED ORDER — SODIUM CHLORIDE 0.9 % IV SOLN
INTRAVENOUS | Status: DC | PRN
  Administered 2011-07-04: 09:00:00 via INTRAVENOUS

## 2011-07-04 MED ORDER — GLYCOPYRROLATE 0.2 MG/ML IJ SOLN
INTRAMUSCULAR | Status: DC | PRN
  Administered 2011-07-04: 0.4 mg via INTRAVENOUS

## 2011-07-04 MED ORDER — ENOXAPARIN SODIUM 30 MG/0.3ML ~~LOC~~ SOLN
30.0000 mg | SUBCUTANEOUS | Status: DC
  Administered 2011-07-05: 30 mg via SUBCUTANEOUS
  Filled 2011-07-04 (×4): qty 0.3

## 2011-07-04 MED ORDER — LIDOCAINE HCL (CARDIAC) 20 MG/ML IV SOLN
INTRAVENOUS | Status: DC | PRN
  Administered 2011-07-04: 100 mg via INTRAVENOUS

## 2011-07-04 MED ORDER — ONDANSETRON HCL 4 MG PO TABS: 4.0000 mg | ORAL_TABLET | Freq: Four times a day (QID) | ORAL | Status: DC | PRN

## 2011-07-04 MED ORDER — BUPIVACAINE-EPINEPHRINE PF 0.25-1:200000 % IJ SOLN
INTRAMUSCULAR | Status: AC
  Filled 2011-07-04: qty 30

## 2011-07-04 MED ORDER — ENOXAPARIN SODIUM 30 MG/0.3ML ~~LOC~~ SOLN
30.0000 mg | SUBCUTANEOUS | Status: DC
  Filled 2011-07-04: qty 0.3

## 2011-07-04 MED ORDER — ONDANSETRON HCL 4 MG/2ML IJ SOLN
4.0000 mg | Freq: Four times a day (QID) | INTRAMUSCULAR | Status: DC | PRN
  Administered 2011-07-04 – 2011-07-05 (×2): 4 mg via INTRAVENOUS
  Filled 2011-07-04 (×2): qty 2

## 2011-07-04 MED ORDER — SUCCINYLCHOLINE CHLORIDE 20 MG/ML IJ SOLN
INTRAMUSCULAR | Status: DC | PRN
  Administered 2011-07-04: 140 mg via INTRAVENOUS

## 2011-07-04 MED ORDER — ONDANSETRON HCL 4 MG/2ML IJ SOLN: 4.0000 mg | Freq: Four times a day (QID) | INTRAMUSCULAR | Status: DC | PRN

## 2011-07-04 MED ORDER — ACETAMINOPHEN 325 MG PO TABS: 650.0000 mg | ORAL_TABLET | Freq: Four times a day (QID) | ORAL | Status: DC | PRN

## 2011-07-04 MED ORDER — PROPOFOL 10 MG/ML IV EMUL
INTRAVENOUS | Status: DC | PRN
  Administered 2011-07-04: 200 mg via INTRAVENOUS
  Administered 2011-07-04: 20 mg via INTRAVENOUS

## 2011-07-04 MED ORDER — ONDANSETRON HCL 4 MG/2ML IJ SOLN: 4.0000 mg | Freq: Once | INTRAMUSCULAR | Status: DC | PRN

## 2011-07-04 MED ORDER — NEOSTIGMINE METHYLSULFATE 1 MG/ML IJ SOLN
INTRAMUSCULAR | Status: DC | PRN
  Administered 2011-07-04: 3 mg via INTRAVENOUS

## 2011-07-04 MED ORDER — SODIUM CHLORIDE 0.9 % IJ SOLN
3.0000 mL | Freq: Two times a day (BID) | INTRAMUSCULAR | Status: DC
  Administered 2011-07-04 – 2011-07-06 (×4): 3 mL via INTRAVENOUS

## 2011-07-04 MED ORDER — POTASSIUM CHLORIDE 10 MEQ/100ML IV SOLN
INTRAVENOUS | Status: AC
  Filled 2011-07-04: qty 100

## 2011-07-04 MED ORDER — HYDROMORPHONE HCL PF 1 MG/ML IJ SOLN
1.0000 mg | Freq: Once | INTRAMUSCULAR | Status: AC
  Administered 2011-07-04: 1 mg via INTRAVENOUS
  Filled 2011-07-04: qty 1

## 2011-07-04 MED ORDER — IOHEXOL 300 MG/ML  SOLN
80.0000 mL | Freq: Once | INTRAMUSCULAR | Status: AC | PRN
  Administered 2011-07-04: 80 mL via INTRAVENOUS

## 2011-07-04 MED ORDER — ROCURONIUM BROMIDE 100 MG/10ML IV SOLN
INTRAVENOUS | Status: DC | PRN
  Administered 2011-07-04: 20 mg via INTRAVENOUS
  Administered 2011-07-04: 10 mg via INTRAVENOUS

## 2011-07-04 MED ORDER — HYDROMORPHONE HCL PF 1 MG/ML IJ SOLN
1.0000 mg | INTRAMUSCULAR | Status: DC | PRN
  Administered 2011-07-04: 1 mg via INTRAVENOUS
  Filled 2011-07-04: qty 1

## 2011-07-04 MED ORDER — 0.9 % SODIUM CHLORIDE (POUR BTL) OPTIME
TOPICAL | Status: DC | PRN
  Administered 2011-07-04: 1000 mL

## 2011-07-04 MED ORDER — HYDROMORPHONE HCL PF 1 MG/ML IJ SOLN
1.0000 mg | INTRAMUSCULAR | Status: DC | PRN
  Administered 2011-07-04 – 2011-07-06 (×10): 1 mg via INTRAVENOUS
  Filled 2011-07-04 (×12): qty 1

## 2011-07-04 MED ORDER — ONDANSETRON HCL 4 MG/2ML IJ SOLN
INTRAMUSCULAR | Status: DC | PRN
  Administered 2011-07-04: 4 mg via INTRAVENOUS

## 2011-07-04 MED ORDER — MIDAZOLAM HCL 5 MG/5ML IJ SOLN
INTRAMUSCULAR | Status: DC | PRN
  Administered 2011-07-04: 1.5 mg via INTRAVENOUS

## 2011-07-04 MED ORDER — DEXTROSE 5 % IV SOLN
1.0000 g | Freq: Two times a day (BID) | INTRAVENOUS | Status: AC
  Administered 2011-07-04 – 2011-07-05 (×2): 1 g via INTRAVENOUS
  Filled 2011-07-04 (×2): qty 1

## 2011-07-04 MED ORDER — ONDANSETRON HCL 4 MG/2ML IJ SOLN
4.0000 mg | Freq: Once | INTRAMUSCULAR | Status: AC
  Administered 2011-07-04: 4 mg via INTRAVENOUS
  Filled 2011-07-04: qty 2

## 2011-07-04 MED ORDER — HYDROMORPHONE HCL PF 1 MG/ML IJ SOLN: 0.2500 mg | INTRAMUSCULAR | Status: DC | PRN

## 2011-07-04 SURGICAL SUPPLY — 42 items
APL SKNCLS STERI-STRIP NONHPOA (GAUZE/BANDAGES/DRESSINGS) ×1
BENZOIN TINCTURE PRP APPL 2/3 (GAUZE/BANDAGES/DRESSINGS) ×2 IMPLANT
BLADE SURG ROTATE 9660 (MISCELLANEOUS) IMPLANT
CANISTER SUCTION 2500CC (MISCELLANEOUS) ×2 IMPLANT
CHLORAPREP W/TINT 26ML (MISCELLANEOUS) ×1 IMPLANT
CLOTH BEACON ORANGE TIMEOUT ST (SAFETY) ×2 IMPLANT
COVER SURGICAL LIGHT HANDLE (MISCELLANEOUS) ×2 IMPLANT
DECANTER SPIKE VIAL GLASS SM (MISCELLANEOUS) ×1 IMPLANT
DRAPE LAPAROSCOPIC ABDOMINAL (DRAPES) ×2 IMPLANT
DRAPE UTILITY 15X26 W/TAPE STR (DRAPE) ×4 IMPLANT
DRSG TEGADERM 4X4.75 (GAUZE/BANDAGES/DRESSINGS) ×1 IMPLANT
ELECT CAUTERY BLADE 6.4 (BLADE) ×2 IMPLANT
ELECT REM PT RETURN 9FT ADLT (ELECTROSURGICAL) ×2
ELECTRODE REM PT RTRN 9FT ADLT (ELECTROSURGICAL) ×1 IMPLANT
GLOVE BIO SURGEON STRL SZ7 (GLOVE) ×3 IMPLANT
GLOVE BIOGEL PI IND STRL 7.0 (GLOVE) IMPLANT
GLOVE BIOGEL PI IND STRL 7.5 (GLOVE) ×1 IMPLANT
GLOVE BIOGEL PI INDICATOR 7.0 (GLOVE) ×3
GLOVE BIOGEL PI INDICATOR 7.5 (GLOVE) ×1
GLOVE SURG SS PI 7.0 STRL IVOR (GLOVE) ×6 IMPLANT
GOWN STRL NON-REIN LRG LVL3 (GOWN DISPOSABLE) ×8 IMPLANT
KIT BASIN OR (CUSTOM PROCEDURE TRAY) ×2 IMPLANT
KIT ROOM TURNOVER OR (KITS) ×2 IMPLANT
NEEDLE HYPO 25GX1X1/2 BEV (NEEDLE) ×2 IMPLANT
NS IRRIG 1000ML POUR BTL (IV SOLUTION) ×2 IMPLANT
PACK GENERAL/GYN (CUSTOM PROCEDURE TRAY) ×2 IMPLANT
PAD ARMBOARD 7.5X6 YLW CONV (MISCELLANEOUS) ×2 IMPLANT
SPONGE GAUZE 4X4 12PLY (GAUZE/BANDAGES/DRESSINGS) ×1 IMPLANT
STAPLER VISISTAT 35W (STAPLE) ×2 IMPLANT
STRIP CLOSURE SKIN 1/2X4 (GAUZE/BANDAGES/DRESSINGS) ×2 IMPLANT
SUCTION POOLE TIP (SUCTIONS) ×1 IMPLANT
SUT MNCRL AB 4-0 PS2 18 (SUTURE) ×2 IMPLANT
SUT NOVA NAB DX-16 0-1 5-0 T12 (SUTURE) ×1 IMPLANT
SUT NOVA NAB GS-21 0 18 T12 DT (SUTURE) IMPLANT
SUT NOVA NAB GS-21 1 T12 (SUTURE) IMPLANT
SUT VIC AB 3-0 SH 27 (SUTURE) ×2
SUT VIC AB 3-0 SH 27XBRD (SUTURE) ×1 IMPLANT
SYR CONTROL 10ML LL (SYRINGE) ×2 IMPLANT
TOWEL OR 17X24 6PK STRL BLUE (TOWEL DISPOSABLE) IMPLANT
TOWEL OR 17X26 10 PK STRL BLUE (TOWEL DISPOSABLE) ×2 IMPLANT
TRAY FOLEY CATH 14FRSI W/METER (CATHETERS) IMPLANT
WATER STERILE IRR 1000ML POUR (IV SOLUTION) IMPLANT

## 2011-07-04 NOTE — Progress Notes (Signed)
Subjective: Patient nauseated, quite tender above umbilicus.  Objective: Vital signs in last 24 hours: Temp:  [98 F (36.7 C)-98.7 F (37.1 C)] 98.5 F (36.9 C) (06/24 0545) Pulse Rate:  [58-95] 58  (06/24 0545) Resp:  [14-20] 19  (06/24 0545) BP: (112-133)/(73-86) 133/86 mmHg (06/24 0545) SpO2:  [94 %-97 %] 94 % (06/24 0545) Weight:  [237 lb 14 oz (107.9 kg)] 237 lb 14 oz (107.9 kg) (06/24 0545) Last BM Date: 07/01/11  Intake/Output from previous day:   Intake/Output this shift:    General appearance: alert, cooperative and no distress GI: Non-distended; palpable mass above umbilicus; tender, non-reducible  Lab Results:   Basename 07/03/11 1807 07/01/11 2226  WBC 8.2 9.9  HGB 14.6 14.6  HCT 44.1 43.0  PLT 193 202   BMET  Basename 07/03/11 1807 07/01/11 2226  NA 141 143  K 3.1* 2.9*  CL 101 104  CO2 28 22  GLUCOSE 106* 104*  BUN 26* 33*  CREATININE 3.49* 3.09*  CALCIUM 9.6 9.8   PT/INR No results found for this basename: LABPROT:2,INR:2 in the last 72 hours ABG No results found for this basename: PHART:2,PCO2:2,PO2:2,HCO3:2 in the last 72 hours  Studies/Results: Ct Abdomen Pelvis W Contrast  07/04/2011  *RADIOLOGY REPORT*  Clinical Data: Peritoneal dialysis patient complaining of abdominal pain and flank pain with nausea.  CT ABDOMEN AND PELVIS WITH CONTRAST  Technique:  Multidetector CT imaging of the abdomen and pelvis was performed following the standard protocol during bolus administration of intravenous contrast.  Contrast: 80mL OMNIPAQUE IOHEXOL 300 MG/ML  SOLN  Comparison: 10/29/2010  Findings: Atelectasis versus small effusions in the lung bases.  Diffuse enlargement of the kidneys which are filled with multiple cysts consistent with polycystic renal disease.  Multiple liver cysts consistent with polycystic liver disease.  The spleen, pancreas, gallbladder, abdominal aorta, and retroperitoneal lymph nodes are unremarkable.  The portal and mesenteric  vessels appear patent.  Accessory spleens.  No adrenal gland nodules.  The stomach, small bowel, and colon are not distended.  There is a small periumbilical hernia containing fat and fluid with a small bowel loop.  Mild proximal bowel dilatation with distal small bowel decompression suggesting early obstruction.  Mild diffuse abdominal and pelvic fluid consistent with peritoneal dialysis.  Peritoneal dialysis catheter demonstrated in the right lower quadrant.  Pelvis:  The uterus is surgically absent.  No abnormal adnexal masses.  The appendix is normal.  Diverticula in the sigmoid colon without diverticulitis.  Fluid in the inguinal regions, greater on the right, suggesting small hernias.  Mild degenerative changes in the lumbar spine.  IMPRESSION: Polycystic renal and liver disease.  Peritoneal dialysis fluid in place.  Small periumbilical hernia containing fluid and bowel with proximal bowel distension and distal bowel decompression consistent with mechanical small bowel obstruction.  Original Report Authenticated By: Marlon Pel, M.D.    Anti-infectives: Anti-infectives    None      Assessment/Plan: s/p * No surgery found * Plan urgent ventral hernia repair without mesh.  I hope to leave the PD catheter alone and close the fascia primarily with suture.  Hopefully, she will be able to resume PD within a couple of days.  If she requires a small bowel resection, it may be awhile before PD resume, so she may need other forms of dialysis temporarily. The surgical procedure has been discussed with the patient.  Potential risks, benefits, alternative treatments, and expected outcomes have been explained.  All of the patient's questions at this  time have been answered.  The likelihood of reaching the patient's treatment goal is good.  The patient understand the proposed surgical procedure and wishes to proceed.   LOS: 1 day    Jams Trickett K. 07/04/2011

## 2011-07-04 NOTE — Anesthesia Preprocedure Evaluation (Addendum)
Anesthesia Evaluation  Patient identified by MRN, date of birth, ID band Patient awake    Reviewed: Allergy & Precautions, H&P , NPO status , Patient's Chart, lab work & pertinent test results  Airway Mallampati: I TM Distance: >3 FB Neck ROM: Full    Dental  (+) Teeth Intact and Dental Advisory Given   Pulmonary neg pulmonary ROS,  breath sounds clear to auscultation        Cardiovascular hypertension, Pt. on medications Rhythm:Regular Rate:Normal     Neuro/Psych negative neurological ROS  negative psych ROS   GI/Hepatic GERD-  Medicated and Controlled,  Endo/Other  Morbid obesity  Renal/GU ESRFRenal diseaseHx of Polycystic kidney disease.  ON peritoneal dialysis.     Musculoskeletal negative musculoskeletal ROS (+)   Abdominal   Peds  Hematology negative hematology ROS (+)   Anesthesia Other Findings   Reproductive/Obstetrics                          Anesthesia Physical Anesthesia Plan  ASA: III  Anesthesia Plan: General   Post-op Pain Management:    Induction: Intravenous  Airway Management Planned: Oral ETT  Additional Equipment:   Intra-op Plan:   Post-operative Plan: Extubation in OR  Informed Consent: I have reviewed the patients History and Physical, chart, labs and discussed the procedure including the risks, benefits and alternatives for the proposed anesthesia with the patient or authorized representative who has indicated his/her understanding and acceptance.   Dental advisory given  Plan Discussed with: Anesthesiologist, Surgeon and CRNA  Anesthesia Plan Comments:        Anesthesia Quick Evaluation

## 2011-07-04 NOTE — Progress Notes (Signed)
Incentive spirometer given to patient and instructed on how to use. Patient verbalized understanding and was able to perform10 breaths at 500 ml-700 ml with RN present. Patient encouraged to use IS and turn, cough and deep breath every 2 hours while awake.  [name] Berg RN

## 2011-07-04 NOTE — Preoperative (Signed)
Beta Blockers   Reason not to administer Beta Blockers:Not Applicable 

## 2011-07-04 NOTE — ED Notes (Signed)
MD at bedside. 

## 2011-07-04 NOTE — ED Provider Notes (Addendum)
Medical screening examination/treatment/procedure(s) were conducted as a shared visit with non-physician practitioner(s) and myself.  I personally evaluated the patient during the encounter  Cheri Guppy, MD 07/04/11 0013  She says pain is coming back.  i explained findings and that I have paged nephrology.  Cheri Guppy, MD 07/04/11 0034  I spoke with Dr. Allena Katz.  He rec's ct of abdomen for potential rupture of one of her kidney cysts. He said dialysate results are not c/w peritonitis.  Cheri Guppy, MD 07/04/11 0040

## 2011-07-04 NOTE — H&P (Signed)
[name] [name] is an 46 y.o. female.   PCP - [facility] Dr.Alan Ross. Chief Complaint: Abdominal pain. HPI: 46 year old female with known history of adult polycystic kidney disease, gout presented with complaints of abdominal pain which has been persistent for last 3 days. 3 nights ago she did come to the ER and at that time she was sent home with pain relief medications. Since her pain and nausea vomiting persisted she decided to come last evening. CAT scan abdomen and pelvis shows small bowel obstruction secondary to mechanical obstruction most likely from periumbilical hernia. Has not moved bowels for last 3 days. She did try to eat something yesterday but threw up everything. Patient will be admitted for further workup.  Past Medical History  Diagnosis Date  . Eczema   . Psoriasis   . Hypertension   . Enlarged kidney     bilateral  . Chronic kidney disease   . Gout     previously  . Polycystic kidney disease   . Hyperlipidemia     Past Surgical History  Procedure Date  . Cesarean section 1995 and 1997  . Abdominal hysterectomy 2000?    complete  . Hernia repair 2005    umbilical hernia  . Cataract extraction 2011    bilateral  . Peritoneal catheter insertion     Family History  Problem Relation Age of Onset  . Cancer Sister     cancer that developed in the fatty tissues of the muscle   Social History:  reports that she has never smoked. She does not have any smokeless tobacco history on file. She reports that she does not drink alcohol or use illicit drugs.  Allergies: No Known Allergies  Medications Prior to Admission  Medication Sig Dispense Refill  . allopurinol (ZYLOPRIM) 100 MG tablet Take 100 mg by mouth 3 (three) times daily.       Marland Kitchen atorvastatin (LIPITOR) 40 MG tablet Take 40 mg by mouth daily.       . calcitRIOL (ROCALTROL) 0.5 MCG capsule Take 0.5 mcg by mouth every other day.       Tery Sanfilippo Calcium (STOOL SOFTENER PO) Take 100 mg by mouth 2  (two) times daily as needed. For constipation      . furosemide (LASIX) 40 MG tablet Take 40 mg by mouth 2 (two) times daily.       . Multiple Vitamin (MULTIVITAMIN) capsule Take 1 capsule by mouth daily.        Marland Kitchen omeprazole (PRILOSEC) 20 MG capsule Take 20 mg by mouth daily.        Marland Kitchen oxyCODONE-acetaminophen (PERCOCET) 5-325 MG per tablet Take 1 tablet by mouth every 4 (four) hours as needed for pain.  20 tablet  0  . traMADol (ULTRAM) 50 MG tablet Take 50 mg by mouth 2 (two) times daily as needed. For pain        Results for orders placed during the hospital encounter of 07/03/11 (from the past 48 hour(s))  CBC     Status: Normal   Collection Time   07/03/11  6:07 PM      Component Value Range Comment   WBC 8.2  4.0 - 10.5 K/uL    RBC 4.80  3.87 - 5.11 MIL/uL    Hemoglobin 14.6  12.0 - 15.0 g/dL    HCT 78.2  95.6 - 21.3 %    MCV 91.9  78.0 - 100.0 fL    MCH 30.4  26.0 - 34.0 pg  MCHC 33.1  30.0 - 36.0 g/dL    RDW 40.9  81.1 - 91.4 %    Platelets 193  150 - 400 K/uL   DIFFERENTIAL     Status: Normal   Collection Time   07/03/11  6:07 PM      Component Value Range Comment   Neutrophils Relative 77  43 - 77 %    Neutro Abs 6.3  1.7 - 7.7 K/uL    Lymphocytes Relative 14  12 - 46 %    Lymphs Abs 1.1  0.7 - 4.0 K/uL    Monocytes Relative 7  3 - 12 %    Monocytes Absolute 0.6  0.1 - 1.0 K/uL    Eosinophils Relative 2  0 - 5 %    Eosinophils Absolute 0.2  0.0 - 0.7 K/uL    Basophils Relative 0  0 - 1 %    Basophils Absolute 0.0  0.0 - 0.1 K/uL   COMPREHENSIVE METABOLIC PANEL     Status: Abnormal   Collection Time   07/03/11  6:07 PM      Component Value Range Comment   Sodium 141  135 - 145 mEq/L    Potassium 3.1 (*) 3.5 - 5.1 mEq/L    Chloride 101  96 - 112 mEq/L    CO2 28  19 - 32 mEq/L    Glucose, Bld 106 (*) 70 - 99 mg/dL    BUN 26 (*) 6 - 23 mg/dL    Creatinine, Ser 7.82 (*) 0.50 - 1.10 mg/dL    Calcium 9.6  8.4 - 95.6 mg/dL    Total Protein 6.2  6.0 - 8.3 g/dL     Albumin 3.4 (*) 3.5 - 5.2 g/dL    AST 16  0 - 37 U/L    ALT 24  0 - 35 U/L    Alkaline Phosphatase 64  39 - 117 U/L    Total Bilirubin 0.4  0.3 - 1.2 mg/dL    GFR calc non Af Amer 15 (*) >90 mL/min    GFR calc Af Amer 17 (*) >90 mL/min   LACTATE DEHYDROGENASE, BODY FLUID     Status: Abnormal   Collection Time   07/03/11  9:43 PM      Component Value Range Comment   LD, Fluid 29 (*) 3 - 23 U/L    Fluid Type-FLDH PERITONEAL CAVITY     GLUCOSE, PERITONEAL FLUID     Status: Normal   Collection Time   07/03/11  9:43 PM      Component Value Range Comment   Glucose, Peritoneal Fluid 1117     PROTEIN, BODY FLUID     Status: Normal   Collection Time   07/03/11  9:43 PM      Component Value Range Comment   Total protein, fluid 0.5   NO NORMAL RANGE ESTABLISHED FOR THIS TEST   Fluid Type-FTP PERITONEAL CAVITY     ALBUMIN, FLUID     Status: Normal   Collection Time   07/03/11  9:43 PM      Component Value Range Comment   Albumin, Fluid 0.3   NO NORMAL RANGE ESTABLISHED FOR THIS TEST   Fluid Type-FALB PERITONEAL CAVITY     BODY FLUID CELL COUNT WITH DIFFERENTIAL     Status: Abnormal   Collection Time   07/03/11  9:43 PM      Component Value Range Comment   Fluid Type-FCT PERITONEAL CAVITY      Color, Fluid  STRAW (*) YELLOW    Appearance, Fluid CLEAR  CLEAR    WBC, Fluid 43  0 - 1000 cu mm    Neutrophil Count, Fluid 15  0 - 25 %    Lymphs, Fluid 42      Monocyte-Macrophage-Serous Fluid 37 (*) 50 - 90 %    Eos, Fluid 6      Ct Abdomen Pelvis W Contrast  07/04/2011  *RADIOLOGY REPORT*  Clinical Data: Peritoneal dialysis patient complaining of abdominal pain and flank pain with nausea.  CT ABDOMEN AND PELVIS WITH CONTRAST  Technique:  Multidetector CT imaging of the abdomen and pelvis was performed following the standard protocol during bolus administration of intravenous contrast.  Contrast: 80mL OMNIPAQUE IOHEXOL 300 MG/ML  SOLN  Comparison: 10/29/2010  Findings: Atelectasis versus small  effusions in the lung bases.  Diffuse enlargement of the kidneys which are filled with multiple cysts consistent with polycystic renal disease.  Multiple liver cysts consistent with polycystic liver disease.  The spleen, pancreas, gallbladder, abdominal aorta, and retroperitoneal lymph nodes are unremarkable.  The portal and mesenteric vessels appear patent.  Accessory spleens.  No adrenal gland nodules.  The stomach, small bowel, and colon are not distended.  There is a small periumbilical hernia containing fat and fluid with a small bowel loop.  Mild proximal bowel dilatation with distal small bowel decompression suggesting early obstruction.  Mild diffuse abdominal and pelvic fluid consistent with peritoneal dialysis.  Peritoneal dialysis catheter demonstrated in the right lower quadrant.  Pelvis:  The uterus is surgically absent.  No abnormal adnexal masses.  The appendix is normal.  Diverticula in the sigmoid colon without diverticulitis.  Fluid in the inguinal regions, greater on the right, suggesting small hernias.  Mild degenerative changes in the lumbar spine.  IMPRESSION: Polycystic renal and liver disease.  Peritoneal dialysis fluid in place.  Small periumbilical hernia containing fluid and bowel with proximal bowel distension and distal bowel decompression consistent with mechanical small bowel obstruction.  Original Report Authenticated By: Marlon Pel, M.D.    Review of Systems  Constitutional: Negative.   HENT: Negative.   Eyes: Negative.   Respiratory: Negative.   Cardiovascular: Negative.   Gastrointestinal: Positive for nausea, vomiting and abdominal pain.  Genitourinary: Negative.   Musculoskeletal: Negative.   Skin: Negative.   Neurological: Negative.   Endo/Heme/Allergies: Negative.   Psychiatric/Behavioral: Negative.     Blood pressure 133/86, pulse 58, temperature 98.5 F (36.9 C), temperature source Oral, resp. rate 19, height 5\' 6"  (1.676 m), weight 107.9 kg (237 lb  14 oz), SpO2 94.00%. Physical Exam  Constitutional: She is oriented to person, place, and time. She appears well-developed and well-nourished. No distress.  HENT:  Head: Normocephalic and atraumatic.  Right Ear: External ear normal.  Left Ear: External ear normal.  Mouth/Throat: No oropharyngeal exudate.  Eyes: Conjunctivae are normal. Pupils are equal, round, and reactive to light. Right eye exhibits no discharge. Left eye exhibits no discharge. No scleral icterus.  Neck: Normal range of motion. Neck supple.  Cardiovascular: Normal rate and regular rhythm.   Respiratory: Effort normal and breath sounds normal. No respiratory distress. She has no wheezes. She has no rales.  GI: Soft. There is tenderness (Mild diffuse tenderenss. There is mild distension of the superior aspect of the umblical area.). There is no rebound and no guarding.  Musculoskeletal: Normal range of motion. She exhibits no edema and no tenderness.  Neurological: She is alert and oriented to person, place, and time.  Moves all extremities.  Skin: Skin is warm and dry. No rash noted. She is not diaphoretic. No erythema.  Psychiatric: Her behavior is normal.     Assessment/Plan #1. Small bowel obstruction most likely from mechanical obstruction secondary to periumbilical hernia - patient will be kept n.p.o. and on pain relief medications. Dr. Andrey Campanile on-call surgeon is seeing patient in consult. We'll follow further recommendations according to surgery.  #2. ESRD on peritoneal dialysis - the peritoneal fluid has only 43 WBC. So it's unlikely patient has any bacterial peritonitis at this point of time. Dialysis as per nephrologist. I did discuss with Dr. Allena Katz on-call nephrologist. #3. History of gout.  CODE STATUS - full code.    Eduard Clos 07/04/2011, 6:25 AM

## 2011-07-04 NOTE — Transfer of Care (Signed)
Immediate Anesthesia Transfer of Care Note  Patient: [name] [name]  Procedure(s) Performed: Procedure(s) (LRB): HERNIA REPAIR VENTRAL ADULT (N/A)  Patient Location: PACU  Anesthesia Type: General  Level of Consciousness: awake, oriented and sedated  Airway & Oxygen Therapy: Patient Spontanous Breathing and Patient connected to nasal cannula oxygen  Post-op Assessment: Report given to PACU RN, Post -op Vital signs reviewed and stable, Patient moving all extremities and Patient able to stick tongue midline  Post vital signs: Reviewed and stable  Complications: No apparent anesthesia complications

## 2011-07-04 NOTE — Consult Note (Signed)
Reason for Consult: umbilical hernia/ pSBO Referring Physician: Dr Leslie Andrea is an 46 y.o. female.  HPI: this is a very pleasant 46 year old Caucasian female with end-stage renal disease secondary to polycystic kidney disease who underwent placement of a peritoneal dialysis catheter in October 2012 by Dr. Johna Sheriff laparoscopically. She initially developed some sharp pain in her upper abdomen around her umbilicus on June 21. It was associated with emesis. She was concerned that it may be spontaneous bacterial peritonitis. She came to the emergency department for evaluation. She was treated empirically with antibiotics. There is no evidence of spontaneous bacterial peritonitis on her lab work so she was discharged home. She returned to the emergency department on the 23rd because of persistent emesis and recurring pain. She describes the pain as sharp and intermittent. It is mainly in her upper abdomen around her umbilicus. She denies any recent flatus. Her last bowel movement was on Friday. She does have some baseline constipation since her PD catheter was inserted. She denies any fevers or chills. She denies any melena or hematochezia. She denies any chest pain or shortness of breath. She does get little bit winded after walking for a while. She states this is due to her large kidneys pressing up on her diaphragm. She recently found out that her husband is a Energy manager and they are in the process of being worked up for elective kidney donor transplantation at the [place] [facility] [place]. She still makes some urine. She has had a prior hiatal hernia repair laparoscopically, 2 C-sections, and a total abdominal hysterectomy. She states that she has known about her  umbilical hernia for several months  Past Medical History  Diagnosis Date  . Eczema   . Psoriasis   . Hypertension   . Enlarged kidney     bilateral  . Chronic kidney disease   . Gout     previously  . Polycystic kidney disease    . Hyperlipidemia     Past Surgical History  Procedure Date  . Cesarean section 1995 and 1997  . Abdominal hysterectomy 2000?    complete  . Hernia repair 2005    umbilical hernia  . Cataract extraction 2011    bilateral  . Peritoneal catheter insertion     Family History  Problem Relation Age of Onset  . Cancer Sister     cancer that developed in the fatty tissues of the muscle    Social History:  reports that she has never smoked. She does not have any smokeless tobacco history on file. She reports that she does not drink alcohol or use illicit drugs.  Allergies: No Known Allergies  Medications: I have reviewed the patient's current medications.  Results for orders placed during the hospital encounter of 07/03/11 (from the past 48 hour(s))  CBC     Status: Normal   Collection Time   07/03/11  6:07 PM      Component Value Range Comment   WBC 8.2  4.0 - 10.5 K/uL    RBC 4.80  3.87 - 5.11 MIL/uL    Hemoglobin 14.6  12.0 - 15.0 g/dL    HCT 16.1  09.6 - 04.5 %    MCV 91.9  78.0 - 100.0 fL    MCH 30.4  26.0 - 34.0 pg    MCHC 33.1  30.0 - 36.0 g/dL    RDW 40.9  81.1 - 91.4 %    Platelets 193  150 - 400 K/uL   DIFFERENTIAL  Status: Normal   Collection Time   07/03/11  6:07 PM      Component Value Range Comment   Neutrophils Relative 77  43 - 77 %    Neutro Abs 6.3  1.7 - 7.7 K/uL    Lymphocytes Relative 14  12 - 46 %    Lymphs Abs 1.1  0.7 - 4.0 K/uL    Monocytes Relative 7  3 - 12 %    Monocytes Absolute 0.6  0.1 - 1.0 K/uL    Eosinophils Relative 2  0 - 5 %    Eosinophils Absolute 0.2  0.0 - 0.7 K/uL    Basophils Relative 0  0 - 1 %    Basophils Absolute 0.0  0.0 - 0.1 K/uL   COMPREHENSIVE METABOLIC PANEL     Status: Abnormal   Collection Time   07/03/11  6:07 PM      Component Value Range Comment   Sodium 141  135 - 145 mEq/L    Potassium 3.1 (*) 3.5 - 5.1 mEq/L    Chloride 101  96 - 112 mEq/L    CO2 28  19 - 32 mEq/L    Glucose, Bld 106 (*) 70 - 99 mg/dL     BUN 26 (*) 6 - 23 mg/dL    Creatinine, Ser 1.61 (*) 0.50 - 1.10 mg/dL    Calcium 9.6  8.4 - 09.6 mg/dL    Total Protein 6.2  6.0 - 8.3 g/dL    Albumin 3.4 (*) 3.5 - 5.2 g/dL    AST 16  0 - 37 U/L    ALT 24  0 - 35 U/L    Alkaline Phosphatase 64  39 - 117 U/L    Total Bilirubin 0.4  0.3 - 1.2 mg/dL    GFR calc non Af Amer 15 (*) >90 mL/min    GFR calc Af Amer 17 (*) >90 mL/min   LACTATE DEHYDROGENASE, BODY FLUID     Status: Abnormal   Collection Time   07/03/11  9:43 PM      Component Value Range Comment   LD, Fluid 29 (*) 3 - 23 U/L    Fluid Type-FLDH PERITONEAL CAVITY     GLUCOSE, PERITONEAL FLUID     Status: Normal   Collection Time   07/03/11  9:43 PM      Component Value Range Comment   Glucose, Peritoneal Fluid 1117     PROTEIN, BODY FLUID     Status: Normal   Collection Time   07/03/11  9:43 PM      Component Value Range Comment   Total protein, fluid 0.5   NO NORMAL RANGE ESTABLISHED FOR THIS TEST   Fluid Type-FTP PERITONEAL CAVITY     ALBUMIN, FLUID     Status: Normal   Collection Time   07/03/11  9:43 PM      Component Value Range Comment   Albumin, Fluid 0.3   NO NORMAL RANGE ESTABLISHED FOR THIS TEST   Fluid Type-FALB PERITONEAL CAVITY     BODY FLUID CELL COUNT WITH DIFFERENTIAL     Status: Abnormal   Collection Time   07/03/11  9:43 PM      Component Value Range Comment   Fluid Type-FCT PERITONEAL CAVITY      Color, Fluid STRAW (*) YELLOW    Appearance, Fluid CLEAR  CLEAR    WBC, Fluid 43  0 - 1000 cu mm    Neutrophil Count, Fluid 15  0 - 25 %  Lymphs, Fluid 42      Monocyte-Macrophage-Serous Fluid 37 (*) 50 - 90 %    Eos, Fluid 6       Ct Abdomen Pelvis W Contrast  07/04/2011  *RADIOLOGY REPORT*  Clinical Data: Peritoneal dialysis patient complaining of abdominal pain and flank pain with nausea.  CT ABDOMEN AND PELVIS WITH CONTRAST  Technique:  Multidetector CT imaging of the abdomen and pelvis was performed following the standard protocol during bolus  administration of intravenous contrast.  Contrast: 80mL OMNIPAQUE IOHEXOL 300 MG/ML  SOLN  Comparison: 10/29/2010  Findings: Atelectasis versus small effusions in the lung bases.  Diffuse enlargement of the kidneys which are filled with multiple cysts consistent with polycystic renal disease.  Multiple liver cysts consistent with polycystic liver disease.  The spleen, pancreas, gallbladder, abdominal aorta, and retroperitoneal lymph nodes are unremarkable.  The portal and mesenteric vessels appear patent.  Accessory spleens.  No adrenal gland nodules.  The stomach, small bowel, and colon are not distended.  There is a small periumbilical hernia containing fat and fluid with a small bowel loop.  Mild proximal bowel dilatation with distal small bowel decompression suggesting early obstruction.  Mild diffuse abdominal and pelvic fluid consistent with peritoneal dialysis.  Peritoneal dialysis catheter demonstrated in the right lower quadrant.  Pelvis:  The uterus is surgically absent.  No abnormal adnexal masses.  The appendix is normal.  Diverticula in the sigmoid colon without diverticulitis.  Fluid in the inguinal regions, greater on the right, suggesting small hernias.  Mild degenerative changes in the lumbar spine.  IMPRESSION: Polycystic renal and liver disease.  Peritoneal dialysis fluid in place.  Small periumbilical hernia containing fluid and bowel with proximal bowel distension and distal bowel decompression consistent with mechanical small bowel obstruction.  Original Report Authenticated By: Marlon Pel, M.D.    Review of Systems  Constitutional: Negative for fever and chills.  HENT: Negative for hearing loss and nosebleeds.   Eyes: Negative for blurred vision, double vision and pain.  Respiratory: Negative for shortness of breath.   Cardiovascular: Negative for chest pain, palpitations and orthopnea.  Gastrointestinal: Positive for nausea, vomiting, abdominal pain and constipation. Negative  for diarrhea.  Genitourinary: Negative for dysuria, urgency and hematuria.  Neurological: Negative for speech change, focal weakness, seizures, loss of consciousness and headaches.  Psychiatric/Behavioral: Negative for substance abuse.   Blood pressure 133/86, pulse 58, temperature 98.5 F (36.9 C), temperature source Oral, resp. rate 19, height 5\' 6"  (1.676 m), weight 237 lb 14 oz (107.9 kg), SpO2 94.00%. Physical Exam  Constitutional: She is oriented to person, place, and time. She appears well-developed and well-nourished. No distress.       obese  HENT:  Head: Normocephalic and atraumatic.  Right Ear: External ear normal.  Left Ear: External ear normal.  Eyes: Conjunctivae are normal. No scleral icterus.  Neck: Normal range of motion. Neck supple. No thyromegaly present.  Cardiovascular: Normal rate, regular rhythm, normal heart sounds and intact distal pulses.   Respiratory: Effort normal and breath sounds normal. No respiratory distress. She has no wheezes.  GI: Soft. There is tenderness in the epigastric area and periumbilical area. There is no rigidity, no rebound and no guarding.         obese  Musculoskeletal: Normal range of motion. She exhibits no edema and no tenderness.  Lymphadenopathy:    She has no cervical adenopathy.  Neurological: She is alert and oriented to person, place, and time. She exhibits normal muscle tone.  Skin: Skin is warm and dry. No rash noted. She is not diaphoretic. No erythema.  Psychiatric: She has a normal mood and affect. Her behavior is normal. Judgment and thought content normal.    Assessment/Plan: Morbid obesity ESRD Polycystic kidney disease Polycystic liver disease Psbo secondary to periumbilical hernia  The patient is to remain n.p.o. It appears her partial small bowel obstruction is due to her periumbilical hernia. This will require surgical intervention. She is at slightly higher risk for complications such as hernia recurrence.  My partner who is coming on duty will discuss with her surgery for her umbilical hernia. It is reducible. I do not believe nonoperative management will be successful in this patient because of the high likelihood of recurrence of a bowel obstruction due to hernia.  Mary Sella. Andrey Campanile, MD, FACS General, Bariatric, & Minimally Invasive Surgery [facility], [place]   [facility] M 07/04/2011, 7:37 AM

## 2011-07-04 NOTE — ED Notes (Signed)
Pt finished drinking contrast, advised CT tech.

## 2011-07-04 NOTE — Op Note (Signed)
Ventral Hernia Repair Procedure Note  Indications: Incarcerated supraumbilical ventral hernia  Pre-operative Diagnosis: Incarcerated supraumbilical ventral hernia  Post-operative Diagnosis: Ventral hernia  Surgeon: Maddeline Roorda K.   Assistants: none  Anesthesia: General endotracheal anesthesia  ASA Class: 3E  Procedure Details  The patient was seen in the Holding Room. The risks, benefits, complications, treatment options, and expected outcomes were discussed with the patient. The possibilities of reaction to medication, pulmonary aspiration, perforation of viscus, bleeding, recurrent infection, the need for additional procedures, failure to diagnose a condition, and creating a complication requiring transfusion or operation were discussed with the patient. The patient concurred with the proposed plan, giving informed consent.  The site of surgery properly noted/marked. The patient was taken to the operating room, identified as [name] [name] and the procedure verified as ventral hernia repair. A Time Out was held and the above information confirmed.  The patient was placed supine.  After establishing general anesthesia, the abdomen was prepped with Chloraprep and draped in sterile fashion.  We made a vertical incision over the palpable hernia above the umbilicus.  The CAPD catheter was isolated to the right side of the abdomen. Dissection was carried down to the hernia sac located above the fascia and mobilized from surrounding structures.  The hernia sac was opened sharply and some peritoneal fluid was evacuated.  There was a small knuckle of small bowel that protruded up through the fascial defect.  This appeared intact and viable.  We were able to reduce this.  The defect only measured about 7 mm across. Intact fascia was identified circumferentially around the defect.  The fascial defect was reapproximated with interrupted figure-of-8 1 Novofil sutures.  The subcutaneous tissues were  irrigated.  Hemostasis was confirmed.  The skin incision was closed in layers with a deep layer of 3-0 Vicryl and a 4-0 Vicryl subcuticular closure.  Steri-Strips were applied at the end of the operation.    Instrument, sponge, and needle counts were correct prior to closure and at the conclusion of the case.   Findings: 7 mm defect; viable small bowel  Estimated Blood Loss:  Minimal         Drains: none                      Complications:  None; patient tolerated the procedure well.         Disposition: PACU - hemodynamically stable.         Condition: stable  Wilmon Arms. Corliss Skains, MD, [facility]  07/04/2011 10:51 AM

## 2011-07-04 NOTE — Anesthesia Postprocedure Evaluation (Signed)
  Anesthesia Post-op Note  Patient: [name] [name]  Procedure(s) Performed: Procedure(s) (LRB): HERNIA REPAIR VENTRAL ADULT (N/A)  Patient Location: PACU  Anesthesia Type: General  Level of Consciousness: awake, alert  and oriented  Airway and Oxygen Therapy: Patient Spontanous Breathing and Patient connected to nasal cannula oxygen  Post-op Pain: none  Post-op Assessment: Post-op Vital signs reviewed  Post-op Vital Signs: Reviewed  Complications: No apparent anesthesia complications

## 2011-07-04 NOTE — Progress Notes (Signed)
Pt arrived to the floor via stretcher from the ED.  She walked from the stretcher in the hallway to the bed.  Pt is resting comfortably with her pain at a 3 after receiving medications in the ED before transport.  House coverage was paged and I am now awaiting the MD to come by and asses the patient and give orders.  Will continue to monitor the patient.

## 2011-07-04 NOTE — ED Provider Notes (Signed)
I assumed care of patient from Dr. Weldon Inches.  Hx peritoneal dialysis presenting with abdominal pain and vomiting.  Diasylate clear.  CT with periumbilical hernia and small bowel obstruction. Reducible hernia on exam. TTP.  Admission d/w Dr. Toniann Fail.  Dr. Andrey Campanile of [facility] aware of consult.  BP 120/78  Pulse 76  Temp 98.7 F (37.1 C) (Oral)  Resp 18  SpO2 94%   Glynn Octave, MD 07/04/11 0425

## 2011-07-04 NOTE — Anesthesia Procedure Notes (Signed)
Procedure Name: Intubation Date/Time: 07/04/2011 9:48 AM Performed by: Julianne Rice K Pre-anesthesia Checklist: Emergency Drugs available, Patient identified, Timeout performed, Suction available and Patient being monitored Patient Re-evaluated:Patient Re-evaluated prior to inductionOxygen Delivery Method: Circle system utilized Preoxygenation: Pre-oxygenation with 100% oxygen Intubation Type: Rapid sequence and IV induction Ventilation: Mask ventilation without difficulty Laryngoscope Size: Mac and 3 Grade View: Grade II Tube type: Oral Tube size: 7.5 mm Number of attempts: 1 Airway Equipment and Method: Stylet Placement Confirmation: ETT inserted through vocal cords under direct vision,  breath sounds checked- equal and bilateral and positive ETCO2 Secured at: 23 cm Tube secured with: Tape Dental Injury: Teeth and Oropharynx as per pre-operative assessment

## 2011-07-04 NOTE — Consult Note (Signed)
[facility] Renal Consultation Note    Indication for Consultation:  Management of ESRD/hemodialysis; anemia, hypertension/volume and secondary hyperparathyroidism  HPI: [name] [name] is a 46 y.o. 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 Kathrene Bongo.  She is being worked up at [facility] for a planned transplant from her husband in late August or September of this year.  She presented to the ED 6/23 with abdominal and flank pain and vomiting 6/23 and was treated for possible UTI and rupture of cyst.  After discharge he was unable to keep down meds at home, continued to have severe abdominal pain and returned to the ED.  CT abdomen showed SBO.  Surgery was consulted and she had repair of incarcerated supraumbilical ventral hernia.  She said about 1 liter of fluid was placed prior to surgery and did not think it  Needed to be drained during surgery.  She had a prior umbilical hernia repair with mesh in 2005.  She said her surgeon said she could probably resume PD in 3-4 days.  In is only having mild discomfort at present. She has no fever, chills, N, V, D at present; Prone to constipation. Makes small amount of urine.  Past Medical History  Diagnosis Date  . Eczema   . Psoriasis   . Hypertension   . Enlarged kidney     bilateral  . Chronic kidney disease   . Gout     previously  . Polycystic kidney disease   . Hyperlipidemia    Past Surgical History  Procedure Date  . Cesarean section 1995 and 1997  . Abdominal hysterectomy 2000?    complete  . Hernia repair 2005    umbilical hernia  . Cataract extraction 2011    bilateral  . Peritoneal catheter insertion    Family History  Problem Relation Age of Onset  . Cancer Sister 20    cancer that developed in the fatty tissues of the muscle  . Hypertension Father   . Hypertension Brother   . Polycystic kidney disease Father     died before starting dialysis was  near ESRD  . Polycystic kidney disease Brother     transplant  7 years ago  . Cerebral aneurysm Father     died age 35  Has two children 82 and 27 who have not been checked for PCKD  yet  Social History:  reports that she has never smoked. She does not have any smokeless tobacco history on file. She reports that she does not drink alcohol or use illicit drugs. No Known Allergies Prior to Admission medications   Medication Sig Start Date End Date Taking? Authorizing Provider  allopurinol (ZYLOPRIM) 100 MG tablet Take 100 mg by mouth 3 (three) times daily.  08/07/10  Yes Historical Provider, MD  atorvastatin (LIPITOR) 40 MG tablet Take 40 mg by mouth daily.  08/07/10  Yes Historical Provider, MD  calcitRIOL (ROCALTROL) 0.5 MCG capsule Take 0.5 mcg by mouth every other day.  08/09/10  Yes Historical Provider, MD  Docusate Calcium (STOOL SOFTENER PO) Take 100 mg by mouth 2 (two) times daily as needed. For constipation   Yes Historical Provider, MD  furosemide (LASIX) 40 MG tablet Take 40 mg by mouth 2 (two) times daily.  08/07/10  Yes Historical Provider, MD  Multiple Vitamin (MULTIVITAMIN) capsule Take 1 capsule by mouth daily.     Yes Historical Provider, MD  omeprazole (PRILOSEC) 20 MG capsule  Take 20 mg by mouth daily.     Yes Historical Provider, MD  oxyCODONE-acetaminophen (PERCOCET) 5-325 MG per tablet Take 1 tablet by mouth every 4 (four) hours as needed for pain. 07/02/11 07/12/11 Yes Flint Melter, MD  traMADol (ULTRAM) 50 MG tablet Take 50 mg by mouth 2 (two) times daily as needed. For pain 08/13/10  Yes Historical Provider, MD   Current Facility-Administered Medications  Medication Dose Route Frequency Provider Last Rate Last Dose  . acetaminophen (TYLENOL) tablet 650 mg  650 mg Oral Q6H PRN Eduard Clos, MD       Or  . acetaminophen (TYLENOL) suppository 650 mg  650 mg Rectal Q6H PRN Eduard Clos, MD      . cefOXitin (MEFOXIN) 1 g in dextrose 5 % 50 mL IVPB  1 g Intravenous  Once Wilmon Arms. Tsuei, MD   1 g at 07/04/11 0936  . cefOXitin (MEFOXIN) 1 g in dextrose 5 % 50 mL IVPB  1 g Intravenous Q12H Matthew K. Tsuei, MD      . enoxaparin (LOVENOX) injection 30 mg  30 mg Subcutaneous Q24H Wilmon Arms. Tsuei, MD      . HYDROmorphone (DILAUDID) injection 1 mg  1 mg Intravenous Once Clemetine Marker, MD   1 mg at 07/03/11 1955  . HYDROmorphone (DILAUDID) injection 1 mg  1 mg Intravenous Once Renne Crigler, PA   1 mg at 07/03/11 2237  . HYDROmorphone (DILAUDID) injection 1 mg  1 mg Intravenous Once Cheri Guppy, MD   1 mg at 07/04/11 0114  . HYDROmorphone (DILAUDID) injection 1 mg  1 mg Intravenous Q3H PRN Eduard Clos, MD   1 mg at 07/04/11 1251  . iohexol (OMNIPAQUE) 300 MG/ML solution 80 mL  80 mL Intravenous Once PRN Medication Radiologist, MD   80 mL at 07/04/11 0313  . ondansetron (ZOFRAN) 4 MG/2ML injection           . ondansetron (ZOFRAN) injection 4 mg  4 mg Intravenous Once Clemetine Marker, MD   4 mg at 07/03/11 1955  . ondansetron (ZOFRAN) injection 4 mg  4 mg Intravenous Once Renne Crigler, PA   4 mg at 07/03/11 2236  . ondansetron (ZOFRAN) injection 4 mg  4 mg Intravenous Once Clemetine Marker, MD   4 mg at 07/04/11 0113  . ondansetron (ZOFRAN) tablet 4 mg  4 mg Oral Q6H PRN Eduard Clos, MD       Or  . ondansetron [facility]) injection 4 mg  4 mg Intravenous Q6H PRN Eduard Clos, MD   4 mg at 07/04/11 0835  . sodium chloride 0.9 % injection 3 mL  3 mL Intravenous Q12H Eduard Clos, MD        Labs: Basic Metabolic Panel:  Lab 07/04/11 1308 07/03/11 1807 07/01/11 2226  NA 143 141 143  K 3.0* 3.1* 2.9*  CL 103 101 104  CO2 26 28 22   GLUCOSE 101* 106* 104*  BUN 27* 26* 33*  CREATININE 3.68* 3.49* 3.09*  CALCIUM 8.8 9.6 9.8  ALB -- -- --  PHOS 4.7* -- --   Liver Function Tests:  Lab 07/04/11 1229 07/03/11 1807  AST 13 16  ALT 14 24  ALKPHOS 58 64  BILITOT 0.4 0.4  PROT 5.2* 6.2  ALBUMIN 3.0* 3.4*  CBC:  Lab 07/04/11 1229  07/03/11 1807 07/01/11 2226  WBC 6.6 8.2 9.9  NEUTROABS -- 6.3 8.0*  HGB 13.3 14.6 14.6  HCT 40.1 44.1 43.0  MCV 91.8 91.9 89.8  PLT 179 193 202  CBG:  Lab 07/04/11 1219 07/04/11 0754  GLUCAP 101* 89  Studies/Results: Ct Abdomen Pelvis W Contrast  07/04/2011  *RADIOLOGY REPORT*  Clinical Data: Peritoneal dialysis patient complaining of abdominal pain and flank pain with nausea.  CT ABDOMEN AND PELVIS WITH CONTRAST  Technique:  Multidetector CT imaging of the abdomen and pelvis was performed following the standard protocol during bolus administration of intravenous contrast.  Contrast: 80mL OMNIPAQUE IOHEXOL 300 MG/ML  SOLN  Comparison: 10/29/2010  Findings: Atelectasis versus small effusions in the lung bases.  Diffuse enlargement of the kidneys which are filled with multiple cysts consistent with polycystic renal disease.  Multiple liver cysts consistent with polycystic liver disease.  The spleen, pancreas, gallbladder, abdominal aorta, and retroperitoneal lymph nodes are unremarkable.  The portal and mesenteric vessels appear patent.  Accessory spleens.  No adrenal gland nodules.  The stomach, small bowel, and colon are not distended.  There is a small periumbilical hernia containing fat and fluid with a small bowel loop.  Mild proximal bowel dilatation with distal small bowel decompression suggesting early obstruction.  Mild diffuse abdominal and pelvic fluid consistent with peritoneal dialysis.  Peritoneal dialysis catheter demonstrated in the right lower quadrant.  Pelvis:  The uterus is surgically absent.  No abnormal adnexal masses.  The appendix is normal.  Diverticula in the sigmoid colon without diverticulitis.  Fluid in the inguinal regions, greater on the right, suggesting small hernias.  Mild degenerative changes in the lumbar spine.  IMPRESSION: Polycystic renal and liver disease.  Peritoneal dialysis fluid in place.  Small periumbilical hernia containing fluid and bowel with proximal  bowel distension and distal bowel decompression consistent with mechanical small bowel obstruction.  Original Report Authenticated By: Marlon Pel, M.D.   ROS: Negative except as per HPI  Physical Exam: Filed Vitals:   07/04/11 1145 07/04/11 1153 07/04/11 1212 07/04/11 1220  BP: 124/74 124/74  120/75  Pulse: 63 59  59  Temp:  98.7 F (37.1 C)  98.1 F (36.7 C)  TempSrc:    Oral  Resp: 16 15  16   Height:      Weight:      SpO2: 95% 95% 100% 93%     General: Well developed, pleasant well nourished, in no acute distress. Head: Normocephalic, atraumatic, sclera non-icteric, mucus membranes are moist Neck: Supple. JVD not elevated. Lungs: Clear bilaterally to auscultation without wheezes, rales, or rhonchi. Breathing is unlabored. Heart: RRR with S1 S2. No murmurs, rubs, or gallops appreciated. Abdomen: Soft, + bowel sounds. Dressing intact mid slightly left of central abdomen M-S:  Strength and tone appear normal for age. Lower extremities:without edema or ischemic changes, no open wounds  Neuro: Alert and oriented X 3. Moves all extremities spontaneously. Psych:  Responds to questions appropriately with a normal affect. Dialysis Access: PD cath slight erythema, no drainage  Dialysis Orders: Center:[facility];  Does 4 fills; 3 dwells of 1.7 with 10 min fill and 20 min drain;  Last fill is 1000 and stays in all day.  Does not do mid day exchange because it "makes BP too low" No Epo; calcitriol 0.5 qod  Assessment/Plan: 1.  s/p repair of incarcerated vernal hernia - per surgery  2.  ESRD -  Prone to low K by her report which is to her advantage; makes some urine; follow labs; hold on PD for now; should be well healed by the time of her transplant late summer; resume meds when  off NPo status; check am labs 3.  Hypertension/volume  - no meds; follow 4.  Anemia  - Hgb does not warrant ESA; check am labs 5.  Metabolic bone disease - doesn't use binders; continue qod calcitriol 6.   Nutrition - advance as tol -npo with ice chips  Sheffield Slider, PA-C [facility] [phone] 07/04/2011, 3:22 PM   I have seen and examined this patient and agree with plan as outlined above.  Luckily Mrs Mihalko has some residual renal function so we should be able to wait 24-48 hours before resuming low volume, supine PD.  Pt currently feels better than prior to surgery.  Will continue to follow. Tashaya Ancrum A,MD 07/04/2011 4:32 PM

## 2011-07-05 DIAGNOSIS — N186 End stage renal disease: Secondary | ICD-10-CM

## 2011-07-05 DIAGNOSIS — R070 Pain in throat: Secondary | ICD-10-CM

## 2011-07-05 DIAGNOSIS — IMO0001 Reserved for inherently not codable concepts without codable children: Secondary | ICD-10-CM

## 2011-07-05 LAB — RENAL FUNCTION PANEL
Albumin: 3 g/dL — ABNORMAL LOW (ref 3.5–5.2)
BUN: 27 mg/dL — ABNORMAL HIGH (ref 6–23)
CO2: 28 mEq/L (ref 19–32)
Calcium: 9 mg/dL (ref 8.4–10.5)
Chloride: 105 mEq/L (ref 96–112)
Creatinine, Ser: 3.83 mg/dL — ABNORMAL HIGH (ref 0.50–1.10)
GFR calc Af Amer: 15 mL/min — ABNORMAL LOW (ref >90)
GFR calc non Af Amer: 13 mL/min — ABNORMAL LOW (ref >90)
Glucose, Bld: 85 mg/dL (ref 70–99)
Phosphorus: 4.6 mg/dL (ref 2.3–4.6)
Potassium: 3.1 mEq/L — ABNORMAL LOW (ref 3.5–5.1)
Sodium: 144 mEq/L (ref 135–145)

## 2011-07-05 LAB — BODY FLUID CULTURE
Culture: NO GROWTH
Special Requests: NORMAL

## 2011-07-05 LAB — CBC
HCT: 40.9 % (ref 36.0–46.0)
Hemoglobin: 13.2 g/dL (ref 12.0–15.0)
MCH: 30.1 pg (ref 26.0–34.0)
MCHC: 32.3 g/dL (ref 30.0–36.0)
MCV: 93.4 fL (ref 78.0–100.0)
Platelets: 152 10*3/uL (ref 150–400)
RBC: 4.38 MIL/uL (ref 3.87–5.11)
RDW: 13.4 % (ref 11.5–15.5)
WBC: 5.7 10*3/uL (ref 4.0–10.5)

## 2011-07-05 LAB — GLUCOSE, CAPILLARY
Glucose-Capillary: 87 mg/dL (ref 70–99)
Glucose-Capillary: 88 mg/dL (ref 70–99)

## 2011-07-05 MED ORDER — FUROSEMIDE 40 MG PO TABS
40.0000 mg | ORAL_TABLET | Freq: Two times a day (BID) | ORAL | Status: DC
  Administered 2011-07-05 – 2011-07-06 (×3): 40 mg via ORAL
  Filled 2011-07-05 (×6): qty 1

## 2011-07-05 MED ORDER — DOCUSATE SODIUM 100 MG PO CAPS
100.0000 mg | ORAL_CAPSULE | Freq: Two times a day (BID) | ORAL | Status: DC
  Administered 2011-07-05 – 2011-07-07 (×5): 100 mg via ORAL
  Filled 2011-07-05 (×5): qty 1

## 2011-07-05 MED ORDER — CALCITRIOL 0.5 MCG PO CAPS
0.5000 ug | ORAL_CAPSULE | ORAL | Status: DC
  Administered 2011-07-05 – 2011-07-07 (×2): 0.5 ug via ORAL
  Filled 2011-07-05 (×2): qty 1

## 2011-07-05 MED ORDER — RENA-VITE PO TABS
1.0000 | ORAL_TABLET | Freq: Every day | ORAL | Status: DC
  Administered 2011-07-05 – 2011-07-06 (×2): 1 via ORAL
  Filled 2011-07-05 (×3): qty 1

## 2011-07-05 NOTE — Progress Notes (Signed)
Feeling better - sore at incision No nausea  Clear liquids today Abdominal binder.  May use PD if needed.    Wilmon Arms. Corliss Skains, MD, [facility]  07/05/2011 8:53 AM

## 2011-07-05 NOTE — Progress Notes (Signed)
Pt seen and examined, admitted this am by Dr.Kakrakandy S/p hernia repair Continue current management per [facility]/Renal  Zannie Cove, MD [facility] [phone]

## 2011-07-05 NOTE — Progress Notes (Signed)
Subjective: Has sore throat, abd pain starting to get better  Objective: Vital signs in last 24 hours: Temp:  [97.8 F (36.6 C)-98.8 F (37.1 C)] 98.4 F (36.9 C) (06/25 0423) Pulse Rate:  [57-81] 74  (06/25 0423) Resp:  [15-20] 18  (06/25 0423) BP: (120-135)/(67-80) 135/80 mmHg (06/25 0423) SpO2:  [92 %-100 %] 93 % (06/25 0423) FiO2 (%):  [100 %] 100 % (06/24 1212) Weight:  [107.9 kg (237 lb 14 oz)] 107.9 kg (237 lb 14 oz) (06/24 2011) Weight change: 0 kg (0 lb) Last BM Date: 07/01/11  Intake/Output from previous day: 06/24 0701 - 06/25 0700 In: 400 [I.V.:400] Out: 625 [Urine:600; Blood:25]   Physical Exam: General: Alert, awake, oriented x3, in no acute distress. HEENT: No bruits, no goiter. Heart: Regular rate and rhythm, without murmurs, rubs, gallops. Lungs: Clear to auscultation bilaterally. Abdomen: Soft, tender on L side as expected, nontender, nondistended, positive bowel sounds. Extremities: No clubbing cyanosis or edema with positive pedal pulses. Neuro: Grossly intact, nonfocal.    Lab Results: Basic Metabolic Panel:  Basename 07/05/11 0555 07/04/11 1229  NA 144 143  K 3.1* 3.0*  CL 105 103  CO2 28 26  GLUCOSE 85 101*  BUN 27* 27*  CREATININE 3.83* 3.68*  CALCIUM 9.0 8.8  MG -- --  PHOS 4.6 4.7*   Liver Function Tests:  Basename 07/05/11 0555 07/04/11 1229 07/03/11 1807  AST -- 13 16  ALT -- 14 24  ALKPHOS -- 58 64  BILITOT -- 0.4 0.4  PROT -- 5.2* 6.2  ALBUMIN 3.0* 3.0* --   No results found for this basename: LIPASE:2,AMYLASE:2 in the last 72 hours No results found for this basename: AMMONIA:2 in the last 72 hours CBC:  Basename 07/05/11 0555 07/04/11 1229 07/03/11 1807  WBC 5.7 6.6 --  NEUTROABS -- -- 6.3  HGB 13.2 13.3 --  HCT 40.9 40.1 --  MCV 93.4 91.8 --  PLT 152 179 --   Cardiac Enzymes: No results found for this basename: CKTOTAL:3,CKMB:3,CKMBINDEX:3,TROPONINI:3 in the last 72 hours BNP: No results found for this basename:  PROBNP:3 in the last 72 hours D-Dimer: No results found for this basename: DDIMER:2 in the last 72 hours CBG:  Basename 07/05/11 0813 07/05/11 0423 07/04/11 2354 07/04/11 2011 07/04/11 1647 07/04/11 1219  GLUCAP 88 87 77 87 76 101*   Hemoglobin A1C: No results found for this basename: HGBA1C in the last 72 hours Fasting Lipid Panel: No results found for this basename: CHOL,HDL,LDLCALC,TRIG,CHOLHDL,LDLDIRECT in the last 72 hours Thyroid Function Tests: No results found for this basename: TSH,T4TOTAL,FREET4,T3FREE,THYROIDAB in the last 72 hours Anemia Panel: No results found for this basename: VITAMINB12,FOLATE,FERRITIN,TIBC,IRON,RETICCTPCT in the last 72 hours Coagulation: No results found for this basename: LABPROT:2,INR:2 in the last 72 hours Urine Drug Screen: Drugs of Abuse  No results found for this basename: labopia, cocainscrnur, labbenz, amphetmu, thcu, labbarb    Alcohol Level: No results found for this basename: ETH:2 in the last 72 hours Urinalysis:  Basename 07/04/11 2050  COLORURINE YELLOW  LABSPEC 1.028  PHURINE 5.5  GLUCOSEU NEGATIVE  HGBUR NEGATIVE  BILIRUBINUR NEGATIVE  KETONESUR NEGATIVE  PROTEINUR NEGATIVE  UROBILINOGEN 0.2  NITRITE NEGATIVE  LEUKOCYTESUR NEGATIVE    Recent Results (from the past 240 hour(s))  CULTURE, BLOOD (ROUTINE X 2)     Status: Normal (Preliminary result)   Collection Time   07/01/11 10:15 PM      Component Value Range Status Comment   Specimen Description BLOOD LEFT HAND  Final    Special Requests BOTTLES DRAWN AEROBIC AND ANAEROBIC [facility] EACH   Final    Culture  Setup Time [id number]   Final    Culture     Final    Value:        BLOOD CULTURE RECEIVED NO GROWTH TO DATE CULTURE WILL BE HELD FOR 5 DAYS BEFORE ISSUING A FINAL NEGATIVE REPORT   Report Status PENDING   Incomplete   CULTURE, BLOOD (ROUTINE X 2)     Status: Normal (Preliminary result)   Collection Time   07/01/11 10:24 PM      Component Value Range Status  Comment   Specimen Description BLOOD RIGHT ARM   Final    Special Requests BOTTLES DRAWN AEROBIC AND ANAEROBIC 10CC EACH   Final    Culture  Setup Time [id number]   Final    Culture     Final    Value:        BLOOD CULTURE RECEIVED NO GROWTH TO DATE CULTURE WILL BE HELD FOR 5 DAYS BEFORE ISSUING A FINAL NEGATIVE REPORT   Report Status PENDING   Incomplete   BODY FLUID CULTURE     Status: Normal (Preliminary result)   Collection Time   07/01/11 11:18 PM      Component Value Range Status Comment   Specimen Description FLUID PERITONEAL   Final    Special Requests Normal 1 BAG OF FLUID   Final    Gram Stain     Final    Value: RARE WBC PRESENT,BOTH PMN AND MONONUCLEAR     NO ORGANISMS SEEN     Performed at [facility] Gram Stain Report Called to,Read Back By and Verified With: Gram Stain Report Called to,Read Back By and Verified With: ED NEBLETT RN 07/02/11 0120 ACAINJ   Culture NO GROWTH 2 DAYS   Final    Report Status PENDING   Incomplete   GRAM STAIN     Status: Normal   Collection Time   07/01/11 11:18 PM      Component Value Range Status Comment   Specimen Description FLUID PERITONEAL   Final    Special Requests Normal 1 BAG OF FLUID   Final    Gram Stain     Final    Value: RARE WBC PRESENT, PREDOMINANTLY MONONUCLEAR     NO ORGANISMS SEEN     CALLED ED NEBLETT,RN 07/02/11 0120 ACAINJ   Report Status 07/02/2011 FINAL   Final   URINE CULTURE     Status: Normal   Collection Time   07/01/11 11:32 PM      Component Value Range Status Comment   Specimen Description URINE, CLEAN CATCH   Final    Special Requests Normal   Final    Culture  Setup Time [id number]   Final    Colony Count >=100,000 COLONIES/ML   Final    Culture     Final    Value: Multiple bacterial morphotypes present, none predominant. Suggest appropriate recollection if clinically indicated.   Report Status 07/03/2011 FINAL   Final   BODY FLUID CULTURE     Status: Normal (Preliminary result)   Collection  Time   07/03/11  9:43 PM      Component Value Range Status Comment   Specimen Description FLUID PERITONEAL   Final    Special Requests NONE   Final    Gram Stain     Final    Value: RARE WBC PRESENT, PREDOMINANTLY PMN  NO ORGANISMS SEEN   Culture NO GROWTH   Final    Report Status PENDING   Incomplete     Studies/Results: Ct Abdomen Pelvis W Contrast  07/04/2011  *RADIOLOGY REPORT*  Clinical Data: Peritoneal dialysis patient complaining of abdominal pain and flank pain with nausea.  CT ABDOMEN AND PELVIS WITH CONTRAST  Technique:  Multidetector CT imaging of the abdomen and pelvis was performed following the standard protocol during bolus administration of intravenous contrast.  Contrast: 80mL OMNIPAQUE IOHEXOL 300 MG/ML  SOLN  Comparison: 10/29/2010  Findings: Atelectasis versus small effusions in the lung bases.  Diffuse enlargement of the kidneys which are filled with multiple cysts consistent with polycystic renal disease.  Multiple liver cysts consistent with polycystic liver disease.  The spleen, pancreas, gallbladder, abdominal aorta, and retroperitoneal lymph nodes are unremarkable.  The portal and mesenteric vessels appear patent.  Accessory spleens.  No adrenal gland nodules.  The stomach, small bowel, and colon are not distended.  There is a small periumbilical hernia containing fat and fluid with a small bowel loop.  Mild proximal bowel dilatation with distal small bowel decompression suggesting early obstruction.  Mild diffuse abdominal and pelvic fluid consistent with peritoneal dialysis.  Peritoneal dialysis catheter demonstrated in the right lower quadrant.  Pelvis:  The uterus is surgically absent.  No abnormal adnexal masses.  The appendix is normal.  Diverticula in the sigmoid colon without diverticulitis.  Fluid in the inguinal regions, greater on the right, suggesting small hernias.  Mild degenerative changes in the lumbar spine.  IMPRESSION: Polycystic renal and liver disease.   Peritoneal dialysis fluid in place.  Small periumbilical hernia containing fluid and bowel with proximal bowel distension and distal bowel decompression consistent with mechanical small bowel obstruction.  Original Report Authenticated By: Marlon Pel, M.D.    Medications: Scheduled Meds:   . cefOXitin  1 g Intravenous Q12H  . enoxaparin  30 mg Subcutaneous Q24H  . ondansetron      . sodium chloride  3 mL Intravenous Q12H  . white petrolatum      . DISCONTD: enoxaparin  30 mg Subcutaneous Q24H   Continuous Infusions:  PRN Meds:.acetaminophen, acetaminophen, HYDROmorphone (DILAUDID) injection, menthol-cetylpyridinium, ondansetron (ZOFRAN) IV, ondansetron, DISCONTD: 0.9 % irrigation (POUR BTL), DISCONTD: bupivacaine-EPINEPHrine, DISCONTD:  HYDROmorphone (DILAUDID) injection, DISCONTD: ondansetron (ZOFRAN) IV, DISCONTD: ondansetron (ZOFRAN) IV, DISCONTD: ondansetron  Assessment/Plan: 1. SBO s/p Incarcerated Umbilical hernia repair Per [facility] Started on clears 2. ESRD (end stage renal disease) on PD per Renal, ok to resume PD per [facility] 3. Sore throat following intubation Cepacol lozenges PRN 4. HTN: stable 5. DVT Prophylaxis: lovenox   LOS: 2 days   [facility] Pager: [phone] 07/05/2011, 9:43 AM

## 2011-07-05 NOTE — Progress Notes (Signed)
  [facility] Progress Note  Subjective:  Voice is hoarse from ET tube. Surgeon said she could be up and waking with a binder and do PD if needed. Just started CL this am.  Objective Filed Vitals:   07/04/11 1220 07/04/11 1800 07/04/11 2011 07/05/11 0423  BP: 120/75 127/70 127/79 135/80  Pulse: 59 57 60 74  Temp: 98.1 F (36.7 C) 97.8 F (36.6 C) 98.2 F (36.8 C) 98.4 F (36.9 C)  TempSrc: Oral Oral Oral Oral  Resp: 16 20 18 18   Height:   5\' 6"  (1.676 m)   Weight:   107.9 kg (237 lb 14 oz)   SpO2: 93% 99% 92% 93%   Physical Exam General: NAD Heart: RRR Lungs: no wheezes or rales Abdomen: soft tender at incision Extremities: no edema Dialysis Access: PD cath  Assessment/Plan: 1. s/p repair of incarcerated vernal hernia - per surgery; progress as above; do not expect any growth on PD fluid culture sent 2. ESRD - still with low K,  makes some urine; no POs until this am; would do ok without PD today or maybe do one dwell overnight; defer to Dr. Arrie Aran 3. Hypertension/volume - no meds; follow stable; resume lasix 4. Anemia - Hgb stable  does not warrant ESA 5. Metabolic bone disease - doesn't use binders; continue qod calcitriol 6. Nutrition - advance as tol -npo with ice chips; on CL 7.  Additional Objective Labs: Basic Metabolic Panel:  Lab 07/05/11 1610 07/04/11 1229 07/03/11 1807  NA 144 143 141  K 3.1* 3.0* 3.1*  CL 105 103 101  CO2 28 26 28   GLUCOSE 85 101* 106*  BUN 27* 27* 26*  CREATININE 3.83* 3.68* 3.49*  CALCIUM 9.0 8.8 9.6  ALB -- -- --  PHOS 4.6 4.7* --   Liver Function Tests:  Lab 07/05/11 0555 07/04/11 1229 07/03/11 1807  AST -- 13 16  ALT -- 14 24  ALKPHOS -- 58 64  BILITOT -- 0.4 0.4  PROT -- 5.2* 6.2  ALBUMIN 3.0* 3.0* 3.4*  CBC:  Lab 07/05/11 0555 07/04/11 1229 07/03/11 1807 07/01/11 2226  WBC 5.7 6.6 8.2 --  NEUTROABS -- -- 6.3 8.0*  HGB 13.2 13.3 14.6 --  HCT 40.9 40.1 44.1 --  MCV 93.4 91.8 91.9 89.8  PLT 152  179 193 --   Blood Culture    Component Value Date/Time   SDES FLUID PERITONEAL 07/03/2011 2143   SPECREQUEST NONE 07/03/2011 2143   CULT NO GROWTH 07/03/2011 2143   REPTSTATUS PENDING 07/03/2011 2143   CBG:  Lab 07/05/11 0813 07/05/11 0423 07/04/11 2354 07/04/11 2011 07/04/11 1647  GLUCAP 88 87 77 87 76  Medications:      . cefOXitin  1 g Intravenous Q12H  . enoxaparin  30 mg Subcutaneous Q24H  . ondansetron      . sodium chloride  3 mL Intravenous Q12H  . white petrolatum      . DISCONTD: enoxaparin  30 mg Subcutaneous Q24H    I  have reviewed scheduled and prn medications.  Sheffield Slider, PA-C [facility] Beeper [phone]  07/05/2011,10:07 AM  LOS: 2 days   I have seen and examined this patient and agree with plan as outlined above.  We can wait another 24 hours until we resume low volume, supine CAPD. Toneka Fullen A,MD 07/05/2011 11:41 AM

## 2011-07-05 NOTE — Progress Notes (Signed)
Orthopedic Tech Progress Note Patient Details:  [name] [name] 07/22/1965 [id number]  Ortho Devices Type of Ortho Device: Abdominal binder Ortho Device/Splint Interventions: Ordered   Shawnie Pons 07/05/2011, 11:01 AM

## 2011-07-05 NOTE — Progress Notes (Signed)
Patient ID: [name] [name], female   DOB: 25-Dec-1965, 46 y.o.   MRN: [id number] 1 Day Post-Op  Subjective: Pt doing well overall today, appropriate abd pain, denies n/v/flatus or BM.  Objective: Vital signs in last 24 hours: Temp:  [97.8 F (36.6 C)-98.8 F (37.1 C)] 98.4 F (36.9 C) (06/25 0423) Pulse Rate:  [57-81] 74  (06/25 0423) Resp:  [15-20] 18  (06/25 0423) BP: (120-135)/(67-80) 135/80 mmHg (06/25 0423) SpO2:  [92 %-100 %] 93 % (06/25 0423) FiO2 (%):  [100 %] 100 % (06/24 1212) Weight:  [237 lb 14 oz (107.9 kg)] 237 lb 14 oz (107.9 kg) (06/24 2011) Last BM Date: 07/01/11  Intake/Output from previous day: 06/24 0701 - 06/25 0700 In: 400 [I.V.:400] Out: 625 [Urine:600; Blood:25] Intake/Output this shift:    PE: Abd: soft, tender to touch, dressing dry, faint bs, PD cath in place  Lab Results:   [facility] 07/05/11 0555 07/04/11 1229  WBC 5.7 6.6  HGB 13.2 13.3  HCT 40.9 40.1  PLT 152 179   BMET  Basename 07/05/11 0555 07/04/11 1229  NA 144 143  K 3.1* 3.0*  CL 105 103  CO2 28 26  GLUCOSE 85 101*  BUN 27* 27*  CREATININE 3.83* 3.68*  CALCIUM 9.0 8.8   PT/INR No results found for this basename: LABPROT:2,INR:2 in the last 72 hours CMP     Component Value Date/Time   NA 144 07/05/2011 0555   K 3.1* 07/05/2011 0555   CL 105 07/05/2011 0555   CO2 28 07/05/2011 0555   GLUCOSE 85 07/05/2011 0555   BUN 27* 07/05/2011 0555   CREATININE 3.83* 07/05/2011 0555   CALCIUM 9.0 07/05/2011 0555   PROT 5.2* 07/04/2011 1229   ALBUMIN 3.0* 07/05/2011 0555   AST 13 07/04/2011 1229   ALT 14 07/04/2011 1229   ALKPHOS 58 07/04/2011 1229   BILITOT 0.4 07/04/2011 1229   GFRNONAA 13* 07/05/2011 0555   GFRAA 15* 07/05/2011 0555   Lipase     Component Value Date/Time   LIPASE 36 10/29/2010 1224       Studies/Results: Ct Abdomen Pelvis W Contrast  07/04/2011  *RADIOLOGY REPORT*  Clinical Data: Peritoneal dialysis patient complaining of abdominal pain and flank pain with nausea.   CT ABDOMEN AND PELVIS WITH CONTRAST  Technique:  Multidetector CT imaging of the abdomen and pelvis was performed following the standard protocol during bolus administration of intravenous contrast.  Contrast: 80mL OMNIPAQUE IOHEXOL 300 MG/ML  SOLN  Comparison: 10/29/2010  Findings: Atelectasis versus small effusions in the lung bases.  Diffuse enlargement of the kidneys which are filled with multiple cysts consistent with polycystic renal disease.  Multiple liver cysts consistent with polycystic liver disease.  The spleen, pancreas, gallbladder, abdominal aorta, and retroperitoneal lymph nodes are unremarkable.  The portal and mesenteric vessels appear patent.  Accessory spleens.  No adrenal gland nodules.  The stomach, small bowel, and colon are not distended.  There is a small periumbilical hernia containing fat and fluid with a small bowel loop.  Mild proximal bowel dilatation with distal small bowel decompression suggesting early obstruction.  Mild diffuse abdominal and pelvic fluid consistent with peritoneal dialysis.  Peritoneal dialysis catheter demonstrated in the right lower quadrant.  Pelvis:  The uterus is surgically absent.  No abnormal adnexal masses.  The appendix is normal.  Diverticula in the sigmoid colon without diverticulitis.  Fluid in the inguinal regions, greater on the right, suggesting small hernias.  Mild degenerative changes in the lumbar  spine.  IMPRESSION: Polycystic renal and liver disease.  Peritoneal dialysis fluid in place.  Small periumbilical hernia containing fluid and bowel with proximal bowel distension and distal bowel decompression consistent with mechanical small bowel obstruction.  Original Report Authenticated By: Marlon Pel, M.D.    Anti-infectives: Anti-infectives     Start     Dose/Rate Route Frequency Ordered Stop   07/04/11 1800   cefOXitin (MEFOXIN) 1 g in dextrose 5 % 50 mL IVPB        1 g 100 mL/hr over 30 Minutes Intravenous Every 12 hours 07/04/11  1211 07/05/11 0649   07/04/11 0830   cefOXitin (MEFOXIN) 1 g in dextrose 5 % 50 mL IVPB     Comments: On call to operating room      1 g 100 mL/hr over 30 Minutes Intravenous  Once 07/04/11 0818 07/04/11 0936           Assessment/Plan  1.  POD#1-Incarerated umbilical hernia repair: appropriate pain, no n/v, awaiting bowel function to advance diet, ice chips ok, likely will be able advance quickly, up out of bed and start walking, ok to use PD catheter.     LOS: 2 days    Ijanae Macapagal 07/05/2011

## 2011-07-05 NOTE — Progress Notes (Signed)
   CARE MANAGEMENT NOTE 07/05/2011  Patient:  [name] [name], [name] [name]   Account Number:  [id number]  Date Initiated:  07/05/2011  Documentation initiated by:  Darlyne Russian  Subjective/Objective Assessment:   Patient admitted with small bowel obstruction     Action/Plan:   Progression of care and discharge planning   Anticipated DC Date:  07/08/2011   Anticipated DC Plan:  HOME W HOME HEALTH SERVICES         Choice offered to / List presented to:             Status of service:  In process, will continue to follow Medicare Important Message given?   (If response is "NO", the following Medicare IM given date fields will be blank) Date Medicare IM given:   Date Additional Medicare IM given:    Discharge Disposition:    Per UR Regulation:  Reviewed for med. necessity/level of care/duration of stay  If discussed at Long Length of Stay Meetings, dates discussed:    Comments:  07/05/2011  1600 Darlyne Russian RN, [place] [phone]  Patient to surgery for hernia repair on 07/04/2011.

## 2011-07-06 DIAGNOSIS — E876 Hypokalemia: Secondary | ICD-10-CM

## 2011-07-06 DIAGNOSIS — N186 End stage renal disease: Secondary | ICD-10-CM

## 2011-07-06 DIAGNOSIS — IMO0001 Reserved for inherently not codable concepts without codable children: Secondary | ICD-10-CM

## 2011-07-06 LAB — RENAL FUNCTION PANEL
Albumin: 2.9 g/dL — ABNORMAL LOW (ref 3.5–5.2)
BUN: 24 mg/dL — ABNORMAL HIGH (ref 6–23)
CO2: 29 mEq/L (ref 19–32)
Calcium: 8.8 mg/dL (ref 8.4–10.5)
Chloride: 101 mEq/L (ref 96–112)
Creatinine, Ser: 3.61 mg/dL — ABNORMAL HIGH (ref 0.50–1.10)
GFR calc Af Amer: 16 mL/min — ABNORMAL LOW (ref >90)
GFR calc non Af Amer: 14 mL/min — ABNORMAL LOW (ref >90)
Glucose, Bld: 88 mg/dL (ref 70–99)
Phosphorus: 4.4 mg/dL (ref 2.3–4.6)
Potassium: 3 mEq/L — ABNORMAL LOW (ref 3.5–5.1)
Sodium: 142 mEq/L (ref 135–145)

## 2011-07-06 LAB — CBC
HCT: 39.8 % (ref 36.0–46.0)
Hemoglobin: 13.1 g/dL (ref 12.0–15.0)
MCH: 30.2 pg (ref 26.0–34.0)
MCHC: 32.9 g/dL (ref 30.0–36.0)
MCV: 91.7 fL (ref 78.0–100.0)
Platelets: 149 10*3/uL — ABNORMAL LOW (ref 150–400)
RBC: 4.34 MIL/uL (ref 3.87–5.11)
RDW: 13.2 % (ref 11.5–15.5)
WBC: 5.6 10*3/uL (ref 4.0–10.5)

## 2011-07-06 LAB — GLUCOSE, CAPILLARY: Glucose-Capillary: 84 mg/dL (ref 70–99)

## 2011-07-06 MED ORDER — PANTOPRAZOLE SODIUM 40 MG PO TBEC
40.0000 mg | DELAYED_RELEASE_TABLET | Freq: Every day | ORAL | Status: DC
  Administered 2011-07-06 – 2011-07-07 (×2): 40 mg via ORAL
  Filled 2011-07-06 (×2): qty 1

## 2011-07-06 MED ORDER — POTASSIUM CHLORIDE CRYS ER 20 MEQ PO TBCR
40.0000 meq | EXTENDED_RELEASE_TABLET | Freq: Once | ORAL | Status: AC
  Administered 2011-07-06: 40 meq via ORAL
  Filled 2011-07-06: qty 2

## 2011-07-06 MED ORDER — OXYCODONE HCL 5 MG PO TABS
5.0000 mg | ORAL_TABLET | ORAL | Status: DC | PRN
  Administered 2011-07-06 – 2011-07-07 (×4): 5 mg via ORAL
  Filled 2011-07-06 (×4): qty 1

## 2011-07-06 NOTE — Progress Notes (Signed)
Doing better.  Dressing removed - steri-strips intact; no sign of infection.  PD today Home tomorrow.  Wilmon Arms. Corliss Skains, MD, [facility]  07/06/2011 8:46 AM

## 2011-07-06 NOTE — Progress Notes (Signed)
[facility] Progress Note  Subjective:  Ready to try solids.  Hopefully will go home Thursday. No N and V.  Objective Filed Vitals:   07/05/11 1400 07/05/11 1800 07/05/11 2023 07/06/11 0516  BP: 134/84 129/82 132/86 132/81  Pulse: 72 78 80 72  Temp: 98 F (36.7 C) 98.2 F (36.8 C) 98.1 F (36.7 C) 98.4 F (36.9 C)  TempSrc: Oral Oral Oral Oral  Resp: 18 18 18 18   Height:   5\' 6"  (1.676 m)   Weight:   107.8 kg (237 lb 10.5 oz)   SpO2: 98% 96% 96% 97%   Physical Exam General: NAD sitting in chair Heart: RRR Lungs: now wheezes or rales Abdomen: soft, obese Extremities: no significant LE edema Dialysis Access: PD cath  Assessment/Plan: 1. s/p repair of incarcerated vernal hernia - per surgery; progress as above; do not expect any growth on PD fluid culture sent 2. ESRD - still with low K,  Resuming solid foods today; give 40 KCl x 1; start supine CAPD x 4 with lower fills to start and do do one dwell overnight;  3. Hypertension/volume - no meds; follow stable use BP parameters for CPAD fluid ; resume lasix 4. Anemia - Hgb stable  does not warrant ESA 5. Metabolic bone disease - doesn't use binders; continue qod calcitriol 6. Nutrition - advance as tol -  Additional Objective Labs: Basic Metabolic Panel:  Lab 07/06/11 1610 07/05/11 0555 07/04/11 1229  NA 142 144 143  K 3.0* 3.1* 3.0*  CL 101 105 103  CO2 29 28 26   GLUCOSE 88 85 101*  BUN 24* 27* 27*  CREATININE 3.61* 3.83* 3.68*  CALCIUM 8.8 9.0 8.8  ALB -- -- --  PHOS 4.4 4.6 4.7*   Liver Function Tests:  Lab 07/06/11 0535 07/05/11 0555 07/04/11 1229 07/03/11 1807  AST -- -- 13 16  ALT -- -- 14 24  ALKPHOS -- -- 58 64  BILITOT -- -- 0.4 0.4  PROT -- -- 5.2* 6.2  ALBUMIN 2.9* 3.0* 3.0* --  CBC:  Lab 07/06/11 0535 07/05/11 0555 07/04/11 1229 07/03/11 1807 07/01/11 2226  WBC 5.6 5.7 6.6 -- --  NEUTROABS -- -- -- 6.3 8.0*  HGB 13.1 13.2 13.3 -- --  HCT 39.8 40.9 40.1 -- --  MCV 91.7 93.4  91.8 91.9 89.8  PLT 149* 152 179 -- --   Blood Culture    Component Value Date/Time   SDES FLUID PERITONEAL 07/03/2011 2143   SPECREQUEST NONE 07/03/2011 2143   CULT NO GROWTH 1 DAY 07/03/2011 2143   REPTSTATUS PENDING 07/03/2011 2143  CBG:  Lab 07/06/11 0519 07/05/11 0813 07/05/11 0423 07/04/11 2354 07/04/11 2011  GLUCAP 84 88 87 77 87  Medications:      . calcitRIOL  0.5 mcg Oral QODAY  . docusate sodium  100 mg Oral BID  . enoxaparin  30 mg Subcutaneous Q24H  . furosemide  40 mg Oral BID  . multivitamin  1 tablet Oral QHS  . sodium chloride  3 mL Intravenous Q12H    I  have reviewed scheduled and prn medications.  Sheffield Slider, PA-C  [facility] [phone]  07/06/2011,9:06 AM  LOS: 3 days   I have seen and examined this patient and agree with plan as outlined above, but I would only do supine/nocturnal CCPD for 3 exchanges and lower volumes at 1 liter for the next 2 weeks. Othel Hoogendoorn A,MD 07/06/2011 3:26 PM

## 2011-07-06 NOTE — Progress Notes (Signed)
Patient ID: [name] [name], female   DOB: [birth year], 46 y.o.   MRN: [id number] Patient ID: [name] [name], female   DOB: [birth year], 46 y.o.   MRN: [id number] 2 Days Post-Op  Subjective: Pt doing well today, appropriate abd pain, +flauts, denies n/v, no bm yet   Objective: Vital signs in last 24 hours: Temp:  [98 F (36.7 C)-98.4 F (36.9 C)] 98.4 F (36.9 C) (06/26 0516) Pulse Rate:  [66-80] 72  (06/26 0516) Resp:  [18] 18  (06/26 0516) BP: (129-134)/(78-86) 132/81 mmHg (06/26 0516) SpO2:  [96 %-98 %] 97 % (06/26 0516) Weight:  [237 lb 10.5 oz (107.8 kg)] 237 lb 10.5 oz (107.8 kg) (06/25 2023) Last BM Date: 07/01/11  Intake/Output from previous day: 06/25 0701 - 06/26 0700 In: 240 [P.O.:240] Out: 1100 [Urine:1100] Intake/Output this shift:    PE: Abd: soft, tender to touch, dressing dry, better bs today, PD cath in place  Lab Results:   [facility] 07/06/11 0535 07/05/11 0555  WBC 5.6 5.7  HGB 13.1 13.2  HCT 39.8 40.9  PLT 149* 152   BMET  Basename 07/06/11 0535 07/05/11 0555  NA 142 144  K 3.0* 3.1*  CL 101 105  CO2 29 28  GLUCOSE 88 85  BUN 24* 27*  CREATININE 3.61* 3.83*  CALCIUM 8.8 9.0   PT/INR No results found for this basename: LABPROT:2,INR:2 in the last 72 hours CMP     Component Value Date/Time   NA 142 07/06/2011 0535   K 3.0* 07/06/2011 0535   CL 101 07/06/2011 0535   CO2 29 07/06/2011 0535   GLUCOSE 88 07/06/2011 0535   BUN 24* 07/06/2011 0535   CREATININE 3.61* 07/06/2011 0535   CALCIUM 8.8 07/06/2011 0535   PROT 5.2* 07/04/2011 1229   ALBUMIN 2.9* 07/06/2011 0535   AST 13 07/04/2011 1229   ALT 14 07/04/2011 1229   ALKPHOS 58 07/04/2011 1229   BILITOT 0.4 07/04/2011 1229   GFRNONAA 14* 07/06/2011 0535   GFRAA 16* 07/06/2011 0535   Lipase     Component Value Date/Time   LIPASE 36 10/29/2010 1224       Studies/Results: No results found.  Anti-infectives: Anti-infectives     Start     Dose/Rate Route Frequency Ordered Stop   07/04/11 1800    cefOXitin (MEFOXIN) 1 g in dextrose 5 % 50 mL IVPB        1 g 100 mL/hr over 30 Minutes Intravenous Every 12 hours 07/04/11 1211 07/05/11 0649   07/04/11 0830   cefOXitin (MEFOXIN) 1 g in dextrose 5 % 50 mL IVPB     Comments: On call to operating room      1 g 100 mL/hr over 30 Minutes Intravenous  Once 07/04/11 0818 07/04/11 0936           Assessment/Plan  1.  POD#2-Incarerated umbilical hernia repair: appropriate pain, no n/v,  up out of bed and start walking, ok to use PD catheter, advance diet to soft today, likely home tomorrow.     LOS: 3 days    Elliott Quade 07/06/2011

## 2011-07-06 NOTE — Progress Notes (Signed)
[facility] PROGRESS NOTE  [name] [name] BJY:[id number] DOB: 1965-06-06 DOA: 07/03/2011 PCP: Provider Not In System  Assessment/Plan: 1. Small bowel resection, status post incarcerated umbilical hernia repair: Per surgery. 2. End-stage renal disease: Continue peritoneal dialysis per nephrology. 3. Hypokalemia: Replete.  Code Status: Full code Family Communication: Discussed with husband at bedside. Disposition Plan: Home soon.  Brendia Sacks, MD  [facility] Pager [phone]. If 8PM-8AM, please contact night-coverage at [email or web address], password [facility] 07/06/2011, 7:17 PM  LOS: 3 days   HPI/Subjective: No complaints. Feels well.  Objective: Filed Vitals:   07/06/11 0516 07/06/11 0835 07/06/11 1340 07/06/11 1839  BP: 132/81 129/88 127/84 130/78  Pulse: 72 79 81 84  Temp: 98.4 F (36.9 C) 98.1 F (36.7 C) 98.1 F (36.7 C) 98.5 F (36.9 C)  TempSrc: Oral Oral Oral Oral  Resp: 18 18 18 18   Height:      Weight:      SpO2: 97% 88% 95% 96%    Intake/Output Summary (Last 24 hours) at 07/06/11 1917 Last data filed at 07/06/11 0850  Gross per 24 hour  Intake    240 ml  Output      0 ml  Net    240 ml    Exam:   General:  Calm and comfortable in appearance. Nontoxic.  Cardiovascular: Regular rate and rhythm. No murmur, rub, gallop. No lower extremity edema.  Respiratory: Clear to auscultation bilaterally. No wheezes, rales, rhonchi. Normal respiratory effort  Data Reviewed: Basic Metabolic Panel:  Lab 07/06/11 6962 07/05/11 0555 07/04/11 1229 07/03/11 1807 07/01/11 2226  NA 142 144 143 141 143  K 3.0* 3.1* 3.0* 3.1* 2.9*  CL 101 105 103 101 104  CO2 29 28 26 28 22   GLUCOSE 88 85 101* 106* 104*  BUN 24* 27* 27* 26* 33*  CREATININE 3.61* 3.83* 3.68* 3.49* 3.09*  CALCIUM 8.8 9.0 8.8 9.6 9.8  MG -- -- -- -- --  PHOS 4.4 4.6 4.7* -- --   Liver Function Tests:  Lab 07/06/11 0535 07/05/11 0555 07/04/11 1229 07/03/11 1807  AST -- -- 13 16  ALT  -- -- 14 24  ALKPHOS -- -- 58 64  BILITOT -- -- 0.4 0.4  PROT -- -- 5.2* 6.2  ALBUMIN 2.9* 3.0* 3.0* 3.4*   CBC:  Lab 07/06/11 0535 07/05/11 0555 07/04/11 1229 07/03/11 1807 07/01/11 2226  WBC 5.6 5.7 6.6 8.2 9.9  NEUTROABS -- -- -- 6.3 8.0*  HGB 13.1 13.2 13.3 14.6 14.6  HCT 39.8 40.9 40.1 44.1 43.0  MCV 91.7 93.4 91.8 91.9 89.8  PLT 149* 152 179 193 202   CBG:  Lab 07/06/11 0519 07/05/11 0813 07/05/11 0423 07/04/11 2354 07/04/11 2011  GLUCAP 84 88 87 77 87    Recent Results (from the past 240 hour(s))  CULTURE, BLOOD (ROUTINE X 2)     Status: Normal (Preliminary result)   Collection Time   07/01/11 10:15 PM      Component Value Range Status Comment   Specimen Description BLOOD LEFT HAND   Final    Special Requests BOTTLES DRAWN AEROBIC AND ANAEROBIC [facility]   Final    Culture  Setup Time [id number]   Final    Culture     Final    Value:        BLOOD CULTURE RECEIVED NO GROWTH TO DATE CULTURE WILL BE HELD FOR 5 DAYS BEFORE ISSUING A FINAL NEGATIVE REPORT   Report Status PENDING   Incomplete  CULTURE, BLOOD (ROUTINE X 2)     Status: Normal (Preliminary result)   Collection Time   07/01/11 10:24 PM      Component Value Range Status Comment   Specimen Description BLOOD RIGHT ARM   Final    Special Requests BOTTLES DRAWN AEROBIC AND ANAEROBIC 10CC EACH   Final    Culture  Setup Time [id number]   Final    Culture     Final    Value:        BLOOD CULTURE RECEIVED NO GROWTH TO DATE CULTURE WILL BE HELD FOR 5 DAYS BEFORE ISSUING A FINAL NEGATIVE REPORT   Report Status PENDING   Incomplete   BODY FLUID CULTURE     Status: Normal   Collection Time   07/01/11 11:18 PM      Component Value Range Status Comment   Specimen Description FLUID PERITONEAL   Final    Special Requests Normal 1 BAG OF FLUID   Final    Gram Stain     Final    Value: RARE WBC PRESENT,BOTH PMN AND MONONUCLEAR     NO ORGANISMS SEEN     Performed at [facility] Gram Stain Report Called to,Read  Back By and Verified With: Gram Stain Report Called to,Read Back By and Verified With: ED NEBLETT RN 07/02/11 0120 ACAINJ   Culture NO GROWTH 3 DAYS   Final    Report Status 07/05/2011 FINAL   Final   GRAM STAIN     Status: Normal   Collection Time   07/01/11 11:18 PM      Component Value Range Status Comment   Specimen Description FLUID PERITONEAL   Final    Special Requests Normal 1 BAG OF FLUID   Final    Gram Stain     Final    Value: RARE WBC PRESENT, PREDOMINANTLY MONONUCLEAR     NO ORGANISMS SEEN     CALLED ED NEBLETT,RN 07/02/11 0120 ACAINJ   Report Status 07/02/2011 FINAL   Final   URINE CULTURE     Status: Normal   Collection Time   07/01/11 11:32 PM      Component Value Range Status Comment   Specimen Description URINE, CLEAN CATCH   Final    Special Requests Normal   Final    Culture  Setup Time [id number]   Final    Colony Count >=100,000 COLONIES/ML   Final    Culture     Final    Value: Multiple bacterial morphotypes present, none predominant. Suggest appropriate recollection if clinically indicated.   Report Status 07/03/2011 FINAL   Final   BODY FLUID CULTURE     Status: Normal (Preliminary result)   Collection Time   07/03/11  9:43 PM      Component Value Range Status Comment   Specimen Description FLUID PERITONEAL   Final    Special Requests NONE   Final    Gram Stain     Final    Value: RARE WBC PRESENT, PREDOMINANTLY PMN     NO ORGANISMS SEEN   Culture NO GROWTH 2 DAYS   Final    Report Status PENDING   Incomplete    Scheduled Meds:   . calcitRIOL  0.5 mcg Oral QODAY  . docusate sodium  100 mg Oral BID  . enoxaparin  30 mg Subcutaneous Q24H  . furosemide  40 mg Oral BID  . multivitamin  1 tablet Oral QHS  . pantoprazole  40 mg Oral  Q1200  . potassium chloride  40 mEq Oral Once  . sodium chloride  3 mL Intravenous Q12H   Continuous Infusions:   Principal Problem:  *Small bowel obstruction Active Problems:  Hyperlipidemia  ESRD (end stage renal  disease) on dialysis  Abdominal pain

## 2011-07-07 ENCOUNTER — Encounter (HOSPITAL_COMMUNITY): Payer: Self-pay | Admitting: Surgery

## 2011-07-07 DIAGNOSIS — N186 End stage renal disease: Secondary | ICD-10-CM

## 2011-07-07 DIAGNOSIS — IMO0001 Reserved for inherently not codable concepts without codable children: Secondary | ICD-10-CM

## 2011-07-07 DIAGNOSIS — E876 Hypokalemia: Secondary | ICD-10-CM

## 2011-07-07 LAB — BODY FLUID CULTURE: Culture: NO GROWTH

## 2011-07-07 LAB — BASIC METABOLIC PANEL
BUN: 25 mg/dL — ABNORMAL HIGH (ref 6–23)
CO2: 26 mEq/L (ref 19–32)
Calcium: 8.8 mg/dL (ref 8.4–10.5)
Chloride: 101 mEq/L (ref 96–112)
Creatinine, Ser: 3.48 mg/dL — ABNORMAL HIGH (ref 0.50–1.10)
GFR calc Af Amer: 17 mL/min — ABNORMAL LOW (ref >90)
GFR calc non Af Amer: 15 mL/min — ABNORMAL LOW (ref >90)
Glucose, Bld: 87 mg/dL (ref 70–99)
Potassium: 2.8 mEq/L — ABNORMAL LOW (ref 3.5–5.1)
Sodium: 139 mEq/L (ref 135–145)

## 2011-07-07 MED ORDER — POTASSIUM CHLORIDE CRYS ER 20 MEQ PO TBCR: 40.0000 meq | EXTENDED_RELEASE_TABLET | Freq: Every day | ORAL | Status: DC

## 2011-07-07 MED ORDER — POTASSIUM CHLORIDE CRYS ER 20 MEQ PO TBCR
EXTENDED_RELEASE_TABLET | ORAL | Status: AC
  Filled 2011-07-07: qty 2

## 2011-07-07 MED ORDER — POTASSIUM CHLORIDE CRYS ER 20 MEQ PO TBCR
40.0000 meq | EXTENDED_RELEASE_TABLET | Freq: Two times a day (BID) | ORAL | Status: DC
  Administered 2011-07-07: 40 meq via ORAL
  Filled 2011-07-07: qty 2

## 2011-07-07 NOTE — Progress Notes (Signed)
[facility] Progress Note  Subjective:  Ready to go home. Reports initial exchange was very dark and got progressively lighter.  Objective Filed Vitals:   07/06/11 1340 07/06/11 1839 07/06/11 2210 07/07/11 0600  BP: 127/84 130/78 110/69 125/74  Pulse: 81 84 74 81  Temp: 98.1 F (36.7 C) 98.5 F (36.9 C) 98.9 F (37.2 C) 99 F (37.2 C)  TempSrc: Oral Oral Oral Oral  Resp: 18 18 18 18   Height:      Weight:   111.585 kg (246 lb)   SpO2: 95% 96% 94% 96%   Physical Exam General: NAD Heart: RRR Lungs:no wheezes or rales Abdomen: soft, obese Extremities: no significant LE edema Dialysis Access: PD cath  Assessment/Plan: 1. s/p repair of incarcerated vernal hernia - per surgery ok for d/c; do not expect any growth on PD fluid culture sent  2. ESRD - K lower 2.9; CAPD yesterday 1 liter dwells; pt and Home Training are aware plan to change to  1 liter exchanges with CCPD. KCl 40 now and at lunch ordered; expect K to improve with home diet; encouraged high K foods; will recheck K next week at Home Training. 3. Hypertension/volume - wt ^ last PM included dwell and 4 blankets per pt; usual EDW ~106. Volume up a little; Discussed keeping an eye on it and call home training if wt does not get back to usual. 4. Anemia - Hgb stable does not warrant ESA  5. Metabolic bone disease - doesn't use binders; continue qod calcitriol  6. Nutrition - tolerating solids 7. Disp - Ok from renal for d/c - pt is to go by Home Training today to get cycler reprogrammed for 1 liter fills Additional Objective Labs: Basic Metabolic Panel:  Lab 07/07/11 4782 07/06/11 0535 07/05/11 0555 07/04/11 1229  NA 139 142 144 --  K 2.8* 3.0* 3.1* --  CL 101 101 105 --  CO2 26 29 28  --  GLUCOSE 87 88 85 --  BUN 25* 24* 27* --  CREATININE 3.48* 3.61* 3.83* --  CALCIUM 8.8 8.8 9.0 --  ALB -- -- -- --  PHOS -- 4.4 4.6 4.7*   Liver Function Tests:  Lab 07/06/11 0535 07/05/11 0555 07/04/11 1229  07/03/11 1807  AST -- -- 13 16  ALT -- -- 14 24  ALKPHOS -- -- 58 64  BILITOT -- -- 0.4 0.4  PROT -- -- 5.2* 6.2  ALBUMIN 2.9* 3.0* 3.0* --  CBC:  Lab 07/06/11 0535 07/05/11 0555 07/04/11 1229 07/03/11 1807 07/01/11 2226  WBC 5.6 5.7 6.6 -- --  NEUTROABS -- -- -- 6.3 8.0*  HGB 13.1 13.2 13.3 -- --  HCT 39.8 40.9 40.1 -- --  MCV 91.7 93.4 91.8 91.9 89.8  PLT 149* 152 179 -- --   Blood Culture    Component Value Date/Time   SDES FLUID PERITONEAL 07/03/2011 2143   SPECREQUEST NONE 07/03/2011 2143   CULT NO GROWTH 2 DAYS 07/03/2011 2143   REPTSTATUS PENDING 07/03/2011 2143  CBG:  Lab 07/07/11 0806 07/06/11 0519 07/05/11 0813 07/05/11 0423 07/04/11 2354  GLUCAP 104* 84 88 87 77   Medications:      . calcitRIOL  0.5 mcg Oral QODAY  . docusate sodium  100 mg Oral BID  . enoxaparin  30 mg Subcutaneous Q24H  . furosemide  40 mg Oral BID  . multivitamin  1 tablet Oral QHS  . pantoprazole  40 mg Oral Q1200  . potassium chloride  40 mEq Oral  Once  . potassium chloride  40 mEq Oral BID  . sodium chloride  3 mL Intravenous Q12H    I  have reviewed scheduled and prn medications.  Sheffield Slider, PA-C [facility] [phone]  07/07/2011,9:08 AM  LOS: 4 days  I have seen and examined this patient and agree with plan as outlined above.  Ok to place pt on 40 mEq KCl daily at home and f/u levels with hometraining.  I also contacted hometraining about a change in her prescription to 1 liter fill volume and supine only CCPD without daytime dwell for the next 2 weeks.  Barbara Cower is aware that the [facility] will go there after d/c to have IQ card reprogrammed for this.  F/u with Dr. Kathrene Bongo as an outpt and increase volume per her discretion. Electra Paladino A,MD 07/07/2011 10:42 AM

## 2011-07-07 NOTE — Progress Notes (Addendum)
Pt. discharged to home. Pt after visit summary reviewed and pt capable of re verbalizing medications and follow up appointments with student nurse. Pt remains stable. No signs and symptoms of distress. Educated to return to ER in the event of SOB, dizziness, chest pain, or fainting. Burley Saver, RN

## 2011-07-07 NOTE — Discharge Instructions (Signed)
[facility]  UMBILICAL OR INGUINAL HERNIA REPAIR: POST OP INSTRUCTIONS  Always review your discharge instruction sheet given to you by the facility where your surgery was performed. IF YOU HAVE DISABILITY OR FAMILY LEAVE FORMS, YOU MUST BRING THEM TO THE OFFICE FOR PROCESSING.   DO NOT GIVE THEM TO YOUR DOCTOR.  1. A  prescription for pain medication may be given to you upon discharge.  Take your pain medication as prescribed, if needed.  If narcotic pain medicine is not needed, then you may take acetaminophen (Tylenol) or ibuprofen (Advil) as needed. 2. Take your usually prescribed medications unless otherwise directed. 3. If you need a refill on your pain medication, please contact your pharmacy.  They will contact our office to request authorization. Prescriptions will not be filled after 5 pm or on week-ends. 4. You should follow a light diet the first 24 hours after arrival home, such as soup and crackers, etc.  Be sure to include lots of fluids daily.  Resume your normal diet the day after surgery. 5. Most patients will experience some swelling and bruising around the umbilicus or in the groin and scrotum.  Ice packs and reclining will help.  Swelling and bruising can take several days to resolve.  6. It is common to experience some constipation if taking pain medication after surgery.  Increasing fluid intake and taking a stool softener (such as Colace) will usually help or prevent this problem from occurring.  A mild laxative (Milk of Magnesia or Miralax) should be taken according to package directions if there are no bowel movements after 48 hours. 7. Unless discharge instructions indicate otherwise, you may remove your bandages 24-48 hours after surgery, and you may shower at that time.  You may have steri-strips (small skin tapes) in place directly over the incision.  These strips should be left on the skin for 7-10 days.  If your surgeon used skin glue on the incision,  you may shower in 24 hours.  The glue will flake off over the next 2-3 weeks.  Any sutures or staples will be removed at the office during your follow-up visit. 8. ACTIVITIES:  You may resume regular (light) daily activities beginning the next day--such as daily self-care, walking, climbing stairs--gradually increasing activities as tolerated.  You may have sexual intercourse when it is comfortable.  Refrain from any heavy lifting or straining until approved by your doctor. a. You may drive when you are no longer taking prescription pain medication, you can comfortably wear a seatbelt, and you can safely maneuver your car and apply brakes. 9. You should see your doctor in the office for a follow-up appointment approximately 2-3 weeks after your surgery.  Make sure that you call for this appointment within a day or two after you arrive home to insure a convenient appointment time.   WHEN TO CALL YOUR DOCTOR: 1. Fever over 101.0 2. Inability to urinate 3. Nausea and/or vomiting 4. Extreme swelling or bruising 5. Continued bleeding from incision. 6. Increased pain, redness, or drainage from the incision  The clinic staff is available to answer your questions during regular business hours.  Please don't hesitate to call and ask to speak to one of the nurses for clinical concerns.  If you have a medical emergency, go to the nearest emergency room or call 911.  A surgeon from [facility] is always on call at the hospital   [address], [address], [place], [place]  [address] ?    [address] [address], [place], [place]   [address] [phone] ? [phone] ? FAX [phone] Web site: [email or web address]  

## 2011-07-07 NOTE — Discharge Summary (Signed)
Physician Discharge Summary  [name] [name] ZOX:[id number] DOB: Apr 06, 1965 DOA: 07/03/2011  PCP: Daisy Floro, MD Nephrologist: Dr. Kathrene Bongo.   Admit date: 07/03/2011 Discharge date: 07/07/2011  Recommendations for Outpatient Follow-up:  1. Followup healing from recent abdominal surgery. 2. Consider repeat potassium level one week.   Follow-up Information    Call Wynona Luna., MD. (Follow up after hernia repair)    Contact information:   [facility] [address] [place] [address] [phone]       Follow up with Daisy Floro, MD.   Contact information:   [address] [place] [address] [phone]       Call Cecille Aver, MD. (Follow-up as directed)    Contact information:   [address] [place] [address] [phone]         Discharge Diagnoses:  1. Small bowel resection status post incarcerated umbilical hernia repair 2. End-stage renal disease 3. Hypokalemia  Discharge Condition: Improved Disposition: Home with resumption of peritoneal dialysis per nephrology  Diet recommendation: Regular  History of present illness:  46 year old woman on peritoneal dialysis (history of polycystic kidney disease) presented with abdominal pain, nausea and vomiting. Dialysate results did not suggest peritonitis. CT of the abdomen and pelvis demonstrated umbilical hernia with small bowel obstruction the patient was admitted for surgical consultation.  Hospital Course:  [name] underwent urgent supraumbilical ventral hernia repair and was seen in consultation with nephrology for her history of end-stage renal disease and ongoing peritoneal dialysis. She did well postoperatively and peritoneal dialysis was resumed without event. She has been cleared for discharge by general surgery and nephrology. 1. Small bowel resection, status post incarcerated umbilical hernia repair: Per  surgery- Stable for discharge. Followup with general surgery 2-3 weeks. No heavy lifting for 2-3 weeks. Walking encouraged.  2. End-stage renal disease: Continue peritoneal dialysis per nephrology.  3. Anemia: Per nephrology.  4. Hypokalemia: Replete.Potassium expected to improve with home diet. Recheck potassium next week at home training. Start potassium 40 meq per day per discussion with Dr. Arrie Aran.  Consultants:  General surgery   Nephrology  Procedures:  07/04/2011 Incarcerated supraumbilical ventral hernia repair  Discharge Instructions  Discharge Orders    Future Orders Please Complete By Expires   Diet general      Increase activity slowly      Discharge instructions      Comments:   No heavy lifting for 2-3 weeks. Walking encouraged.     Medication List  As of 07/07/2011 10:41 AM   TAKE these medications         allopurinol 100 MG tablet   Commonly known as: ZYLOPRIM   Take 100 mg by mouth 3 (three) times daily.      atorvastatin 40 MG tablet   Commonly known as: LIPITOR   Take 40 mg by mouth daily.      calcitRIOL 0.5 MCG capsule   Commonly known as: ROCALTROL   Take 0.5 mcg by mouth every other day.      furosemide 40 MG tablet   Commonly known as: LASIX   Take 40 mg by mouth 2 (two) times daily.      multivitamin capsule   Take 1 capsule by mouth daily.      omeprazole 20 MG capsule   Commonly known as: PRILOSEC   Take 20 mg by mouth daily.      oxyCODONE-acetaminophen 5-325 MG per tablet   Commonly known as: PERCOCET  Take 1 tablet by mouth every 4 (four) hours as needed for pain.      potassium chloride SA 20 MEQ tablet   Commonly known as: K-DUR,KLOR-CON   Take 2 tablets (40 mEq total) by mouth daily.      STOOL SOFTENER PO   Take 100 mg by mouth 2 (two) times daily as needed. For constipation      traMADol 50 MG tablet   Commonly known as: ULTRAM   Take 50 mg by mouth 2 (two) times daily as needed. For pain           The  results of significant diagnostics from this hospitalization (including imaging, microbiology, ancillary and laboratory) are listed below for reference.    Significant Diagnostic Studies: Ct Abdomen Pelvis W Contrast  07/04/2011  *RADIOLOGY REPORT*  Clinical Data: Peritoneal dialysis patient complaining of abdominal pain and flank pain with nausea.  CT ABDOMEN AND PELVIS WITH CONTRAST  Technique:  Multidetector CT imaging of the abdomen and pelvis was performed following the standard protocol during bolus administration of intravenous contrast.  Contrast: 80mL OMNIPAQUE IOHEXOL 300 MG/ML  SOLN  Comparison: 10/29/2010  Findings: Atelectasis versus small effusions in the lung bases.  Diffuse enlargement of the kidneys which are filled with multiple cysts consistent with polycystic renal disease.  Multiple liver cysts consistent with polycystic liver disease.  The spleen, pancreas, gallbladder, abdominal aorta, and retroperitoneal lymph nodes are unremarkable.  The portal and mesenteric vessels appear patent.  Accessory spleens.  No adrenal gland nodules.  The stomach, small bowel, and colon are not distended.  There is a small periumbilical hernia containing fat and fluid with a small bowel loop.  Mild proximal bowel dilatation with distal small bowel decompression suggesting early obstruction.  Mild diffuse abdominal and pelvic fluid consistent with peritoneal dialysis.  Peritoneal dialysis catheter demonstrated in the right lower quadrant.  Pelvis:  The uterus is surgically absent.  No abnormal adnexal masses.  The appendix is normal.  Diverticula in the sigmoid colon without diverticulitis.  Fluid in the inguinal regions, greater on the right, suggesting small hernias.  Mild degenerative changes in the lumbar spine.  IMPRESSION: Polycystic renal and liver disease.  Peritoneal dialysis fluid in place.  Small periumbilical hernia containing fluid and bowel with proximal bowel distension and distal bowel  decompression consistent with mechanical small bowel obstruction.  Original Report Authenticated By: Marlon Pel, M.D.   Microbiology: Recent Results (from the past 240 hour(s))  CULTURE, BLOOD (ROUTINE X 2)     Status: Normal (Preliminary result)   Collection Time   07/01/11 10:15 PM      Component Value Range Status Comment   Specimen Description BLOOD LEFT HAND   Final    Special Requests BOTTLES DRAWN AEROBIC AND ANAEROBIC 5CC EACH   Final    Culture  Setup Time [id number]   Final    Culture     Final    Value:        BLOOD CULTURE RECEIVED NO GROWTH TO DATE CULTURE WILL BE HELD FOR 5 DAYS BEFORE ISSUING A FINAL NEGATIVE REPORT   Report Status PENDING   Incomplete   CULTURE, BLOOD (ROUTINE X 2)     Status: Normal (Preliminary result)   Collection Time   07/01/11 10:24 PM      Component Value Range Status Comment   Specimen Description BLOOD RIGHT ARM   Final    Special Requests BOTTLES DRAWN AEROBIC AND ANAEROBIC 10CC EACH   Final  Culture  Setup Time [id number]   Final    Culture     Final    Value:        BLOOD CULTURE RECEIVED NO GROWTH TO DATE CULTURE WILL BE HELD FOR 5 DAYS BEFORE ISSUING A FINAL NEGATIVE REPORT   Report Status PENDING   Incomplete   BODY FLUID CULTURE     Status: Normal   Collection Time   07/01/11 11:18 PM      Component Value Range Status Comment   Specimen Description FLUID PERITONEAL   Final    Special Requests Normal 1 BAG OF FLUID   Final    Gram Stain     Final    Value: RARE WBC PRESENT,BOTH PMN AND MONONUCLEAR     NO ORGANISMS SEEN     Performed at [facility] Gram Stain Report Called to,Read Back By and Verified With: Gram Stain Report Called to,Read Back By and Verified With: ED NEBLETT RN 07/02/11 0120 ACAINJ   Culture NO GROWTH 3 DAYS   Final    Report Status 07/05/2011 FINAL   Final   GRAM STAIN     Status: Normal   Collection Time   07/01/11 11:18 PM      Component Value Range Status Comment   Specimen Description FLUID  PERITONEAL   Final    Special Requests Normal 1 BAG OF FLUID   Final    Gram Stain     Final    Value: RARE WBC PRESENT, PREDOMINANTLY MONONUCLEAR     NO ORGANISMS SEEN     CALLED ED NEBLETT,RN 07/02/11 0120 ACAINJ   Report Status 07/02/2011 FINAL   Final   URINE CULTURE     Status: Normal   Collection Time   07/01/11 11:32 PM      Component Value Range Status Comment   Specimen Description URINE, CLEAN CATCH   Final    Special Requests Normal   Final    Culture  Setup Time [id number]   Final    Colony Count >=100,000 COLONIES/ML   Final    Culture     Final    Value: Multiple bacterial morphotypes present, none predominant. Suggest appropriate recollection if clinically indicated.   Report Status 07/03/2011 FINAL   Final   BODY FLUID CULTURE     Status: Normal   Collection Time   07/03/11  9:43 PM      Component Value Range Status Comment   Specimen Description FLUID PERITONEAL   Final    Special Requests NONE   Final    Gram Stain     Final    Value: RARE WBC PRESENT, PREDOMINANTLY PMN     NO ORGANISMS SEEN   Culture NO GROWTH 3 DAYS   Final    Report Status 07/07/2011 FINAL   Final     Labs: Basic Metabolic Panel:  Lab 07/07/11 2130 07/06/11 0535 07/05/11 0555 07/04/11 1229 07/03/11 1807  NA 139 142 144 143 141  K 2.8* 3.0* 3.1* 3.0* 3.1*  CL 101 101 105 103 101  CO2 26 29 28 26 28   GLUCOSE 87 88 85 101* 106*  BUN 25* 24* 27* 27* 26*  CREATININE 3.48* 3.61* 3.83* 3.68* 3.49*  CALCIUM 8.8 8.8 9.0 8.8 9.6  MG -- -- -- -- --  PHOS -- 4.4 4.6 4.7* --   Liver Function Tests:  Lab 07/06/11 0535 07/05/11 0555 07/04/11 1229 07/03/11 1807  AST -- -- 13 16  ALT -- --  14 24  ALKPHOS -- -- 58 64  BILITOT -- -- 0.4 0.4  PROT -- -- 5.2* 6.2  ALBUMIN 2.9* 3.0* 3.0* 3.4*   CBC:  Lab 07/06/11 0535 07/05/11 0555 07/04/11 1229 07/03/11 1807 07/01/11 2226  WBC 5.6 5.7 6.6 8.2 9.9  NEUTROABS -- -- -- 6.3 8.0*  HGB 13.1 13.2 13.3 14.6 14.6  HCT 39.8 40.9 40.1 44.1 43.0  MCV  91.7 93.4 91.8 91.9 89.8  PLT 149* 152 179 193 202   CBG:  Lab 07/07/11 0806 07/06/11 0519 07/05/11 0813 07/05/11 0423 07/04/11 2354  GLUCAP 104* 84 88 87 77    Principal Problem:  *Small bowel obstruction Active Problems:  Hyperlipidemia  ESRD (end stage renal disease) on dialysis  Abdominal pain  Time coordinating discharge: 25 minutes.  Signed:  Brendia Sacks, MD [facility] 07/07/2011, 10:41 AM

## 2011-07-07 NOTE — Progress Notes (Signed)
Doing well Ready for discharge.  Wilmon Arms. Corliss Skains, MD, [facility]  07/07/2011 12:51 PM

## 2011-07-07 NOTE — Progress Notes (Signed)
Patient ID: [name] [name], female   DOB: [birth year], 46 y.o.   MRN: [id number] 3 Days Post-Op  Subjective: Pt doing well today, appropriate abd pain, +flauts, denies n/v, dwelling with her PD cath right now, wants to go home. Tolerating diet well   Objective: Vital signs in last 24 hours: Temp:  [98.1 F (36.7 C)-99 F (37.2 C)] 99 F (37.2 C) (06/27 0600) Pulse Rate:  [74-84] 81  (06/27 0600) Resp:  [18] 18  (06/27 0600) BP: (110-130)/(69-88) 125/74 mmHg (06/27 0600) SpO2:  [88 %-96 %] 96 % (06/27 0600) Weight:  [246 lb (111.585 kg)] 246 lb (111.585 kg) (06/26 2210) Last BM Date: 07/01/11  Intake/Output from previous day: 06/26 0701 - 06/27 0700 In: 240 [P.O.:240] Out: -  Intake/Output this shift:    PE: Abd: soft, tender to touch, incision c/d/i with steri-strips, +BS, PD cath in place  Lab Results:   [facility] 07/06/11 0535 07/05/11 0555  WBC 5.6 5.7  HGB 13.1 13.2  HCT 39.8 40.9  PLT 149* 152   BMET  Basename 07/07/11 0550 07/06/11 0535  NA 139 142  K 2.8* 3.0*  CL 101 101  CO2 26 29  GLUCOSE 87 88  BUN 25* 24*  CREATININE 3.48* 3.61*  CALCIUM 8.8 8.8   PT/INR No results found for this basename: LABPROT:2,INR:2 in the last 72 hours CMP     Component Value Date/Time   NA 139 07/07/2011 0550   K 2.8* 07/07/2011 0550   CL 101 07/07/2011 0550   CO2 26 07/07/2011 0550   GLUCOSE 87 07/07/2011 0550   BUN 25* 07/07/2011 0550   CREATININE 3.48* 07/07/2011 0550   CALCIUM 8.8 07/07/2011 0550   PROT 5.2* 07/04/2011 1229   ALBUMIN 2.9* 07/06/2011 0535   AST 13 07/04/2011 1229   ALT 14 07/04/2011 1229   ALKPHOS 58 07/04/2011 1229   BILITOT 0.4 07/04/2011 1229   GFRNONAA 15* 07/07/2011 0550   GFRAA 17* 07/07/2011 0550   Lipase     Component Value Date/Time   LIPASE 36 10/29/2010 1224       Studies/Results: No results found.  Anti-infectives: Anti-infectives     Start     Dose/Rate Route Frequency Ordered Stop   07/04/11 1800   cefOXitin (MEFOXIN) 1 g in  dextrose 5 % 50 mL IVPB        1 g 100 mL/hr over 30 Minutes Intravenous Every 12 hours 07/04/11 1211 07/05/11 0649   07/04/11 0830   cefOXitin (MEFOXIN) 1 g in dextrose 5 % 50 mL IVPB     Comments: On call to operating room      1 g 100 mL/hr over 30 Minutes Intravenous  Once 07/04/11 0818 07/04/11 0936           Assessment/Plan  1.  POD#2-Incarerated umbilical hernia repair: pain improved, no n/v,  PD seems to be working fine and no increased drainage from wound, ok to d/c home from surgical standpoint today if ok with medicine, needs f/u in 2-3 weeks, no heavy lifting for 2-3 weeks, walking is encouraged.  LOS: 4 days    Kathalina Ostermann 07/07/2011

## 2011-07-07 NOTE — Progress Notes (Signed)
[facility] PROGRESS NOTE  [name] [name] ZOX:[id number] DOB: 09-14-1965 DOA: 07/03/2011 PCP: Daisy Floro, MD  Nephrologist: Dr. Kathrene Bongo.   Assessment/Plan: 1. Small bowel resection, status post incarcerated umbilical hernia repair: Per surgery- Stable for discharge. Followup with general surgery 2-3 weeks. No heavy lifting for 2-3 weeks. Walking encouraged. 2. End-stage renal disease: Continue peritoneal dialysis per nephrology. 3. Anemia: Per nephrology. 4. Hypokalemia: Replete.Potassium expected to improve with home diet. Recheck potassium next week at home training. Start potassium 40 meq per day per discussion with Dr. Arrie Aran.  Code Status: Full code Family Communication: Discussed with husband at bedside. Disposition Plan: Home soon.  Brendia Sacks, MD  [facility] Pager [phone]. If 8PM-8AM, please contact night-coverage at [email or web address], password [facility] 07/07/2011, 9:58 AM  LOS: 4 days   Brief narrative: 46 year old woman on peritoneal dialysis (history of polycystic kidney disease) presented with abdominal pain, nausea and vomiting. Dialysate results did not suggest peritonitis. CT of the abdomen and pelvis demonstrated umbilical hernia with small bowel obstruction the patient was admitted for surgical consultation.  She underwent urgent supraumbilical ventral hernia repair and was seen in consultation with nephrology for her history of end-stage renal disease and ongoing peritoneal dialysis. She did well postoperatively and peritoneal dialysis was resumed without event.  Consultants:  General surgery  Nephrology  Procedures:  07/04/2011 Incarcerated supraumbilical ventral hernia repair  Antibiotics:  HPI/Subjective: Afebrile, vital signs stable. No complaints. Ready to go home.  Objective: Filed Vitals:   07/06/11 1340 07/06/11 1839 07/06/11 2210 07/07/11 0600  BP: 127/84 130/78 110/69 125/74  Pulse: 81 84 74 81  Temp: 98.1 F  (36.7 C) 98.5 F (36.9 C) 98.9 F (37.2 C) 99 F (37.2 C)  TempSrc: Oral Oral Oral Oral  Resp: 18 18 18 18   Height:      Weight:   111.585 kg (246 lb)   SpO2: 95% 96% 94% 96%   No intake or output data in the 24 hours ending 07/07/11 0958  Exam:   General:  Calm and comfortable in appearance. Well-appearing.  Cardiovascular: Regular rate and rhythm. No murmur, rub, gallop. No lower extremity edema.  Respiratory: Clear to auscultation bilaterally. No wheezes, rales, rhonchi. Normal respiratory effort  Data Reviewed: Basic Metabolic Panel:  Lab 07/07/11 8295 07/06/11 0535 07/05/11 0555 07/04/11 1229 07/03/11 1807  NA 139 142 144 143 141  K 2.8* 3.0* 3.1* 3.0* 3.1*  CL 101 101 105 103 101  CO2 26 29 28 26 28   GLUCOSE 87 88 85 101* 106*  BUN 25* 24* 27* 27* 26*  CREATININE 3.48* 3.61* 3.83* 3.68* 3.49*  CALCIUM 8.8 8.8 9.0 8.8 9.6  MG -- -- -- -- --  PHOS -- 4.4 4.6 4.7* --   CBG:  Lab 07/07/11 0806 07/06/11 0519 07/05/11 0813 07/05/11 0423 07/04/11 2354  GLUCAP 104* 84 88 87 77    Recent Results (from the past 240 hour(s))  CULTURE, BLOOD (ROUTINE X 2)     Status: Normal (Preliminary result)   Collection Time   07/01/11 10:15 PM      Component Value Range Status Comment   Specimen Description BLOOD LEFT HAND   Final    Special Requests BOTTLES DRAWN AEROBIC AND ANAEROBIC [facility]   Final    Culture  Setup Time [id number]   Final    Culture     Final    Value:        BLOOD CULTURE RECEIVED NO GROWTH TO DATE CULTURE WILL BE HELD  FOR 5 DAYS BEFORE ISSUING A FINAL NEGATIVE REPORT   Report Status PENDING   Incomplete   CULTURE, BLOOD (ROUTINE X 2)     Status: Normal (Preliminary result)   Collection Time   07/01/11 10:24 PM      Component Value Range Status Comment   Specimen Description BLOOD RIGHT ARM   Final    Special Requests BOTTLES DRAWN AEROBIC AND ANAEROBIC 10CC EACH   Final    Culture  Setup Time [id number]   Final    Culture     Final    Value:         BLOOD CULTURE RECEIVED NO GROWTH TO DATE CULTURE WILL BE HELD FOR 5 DAYS BEFORE ISSUING A FINAL NEGATIVE REPORT   Report Status PENDING   Incomplete   BODY FLUID CULTURE     Status: Normal   Collection Time   07/01/11 11:18 PM      Component Value Range Status Comment   Specimen Description FLUID PERITONEAL   Final    Special Requests Normal 1 BAG OF FLUID   Final    Gram Stain     Final    Value: RARE WBC PRESENT,BOTH PMN AND MONONUCLEAR     NO ORGANISMS SEEN     Performed at [facility] Gram Stain Report Called to,Read Back By and Verified With: Gram Stain Report Called to,Read Back By and Verified With: ED NEBLETT RN 07/02/11 0120 ACAINJ   Culture NO GROWTH 3 DAYS   Final    Report Status 07/05/2011 FINAL   Final   GRAM STAIN     Status: Normal   Collection Time   07/01/11 11:18 PM      Component Value Range Status Comment   Specimen Description FLUID PERITONEAL   Final    Special Requests Normal 1 BAG OF FLUID   Final    Gram Stain     Final    Value: RARE WBC PRESENT, PREDOMINANTLY MONONUCLEAR     NO ORGANISMS SEEN     CALLED ED NEBLETT,RN 07/02/11 0120 ACAINJ   Report Status 07/02/2011 FINAL   Final   URINE CULTURE     Status: Normal   Collection Time   07/01/11 11:32 PM      Component Value Range Status Comment   Specimen Description URINE, CLEAN CATCH   Final    Special Requests Normal   Final    Culture  Setup Time [id number]   Final    Colony Count >=100,000 COLONIES/ML   Final    Culture     Final    Value: Multiple bacterial morphotypes present, none predominant. Suggest appropriate recollection if clinically indicated.   Report Status 07/03/2011 FINAL   Final   BODY FLUID CULTURE     Status: Normal (Preliminary result)   Collection Time   07/03/11  9:43 PM      Component Value Range Status Comment   Specimen Description FLUID PERITONEAL   Final    Special Requests NONE   Final    Gram Stain     Final    Value: RARE WBC PRESENT, PREDOMINANTLY PMN     NO  ORGANISMS SEEN   Culture NO GROWTH 2 DAYS   Final    Report Status PENDING   Incomplete    Scheduled Meds:    . calcitRIOL  0.5 mcg Oral QODAY  . docusate sodium  100 mg Oral BID  . enoxaparin  30 mg Subcutaneous Q24H  .  furosemide  40 mg Oral BID  . multivitamin  1 tablet Oral QHS  . pantoprazole  40 mg Oral Q1200  . potassium chloride SA      . potassium chloride  40 mEq Oral Once  . potassium chloride  40 mEq Oral BID  . sodium chloride  3 mL Intravenous Q12H   Continuous Infusions:   Principal Problem:  *Small bowel obstruction Active Problems:  Hyperlipidemia  ESRD (end stage renal disease) on dialysis  Abdominal pain

## 2011-07-08 ENCOUNTER — Other Ambulatory Visit: Payer: Managed Care, Other (non HMO)

## 2011-07-08 LAB — CULTURE, BLOOD (ROUTINE X 2)
Culture  Setup Time: 201306220350
Culture: NO GROWTH

## 2011-08-02 ENCOUNTER — Encounter (INDEPENDENT_AMBULATORY_CARE_PROVIDER_SITE_OTHER): Payer: Self-pay | Admitting: Surgery

## 2011-08-04 ENCOUNTER — Encounter (INDEPENDENT_AMBULATORY_CARE_PROVIDER_SITE_OTHER): Payer: Self-pay | Admitting: Surgery

## 2011-08-04 ENCOUNTER — Ambulatory Visit (INDEPENDENT_AMBULATORY_CARE_PROVIDER_SITE_OTHER): Payer: Managed Care, Other (non HMO) | Admitting: Surgery

## 2011-08-04 VITALS — BP 136/94 | HR 77 | Temp 98.2°F | Ht 66.0 in | Wt 230.0 lb

## 2011-08-04 DIAGNOSIS — K436 Other and unspecified ventral hernia with obstruction, without gangrene: Secondary | ICD-10-CM | POA: Insufficient documentation

## 2011-08-04 NOTE — Progress Notes (Signed)
S/p emergent repair of incarcerated ventral hernia on 07/04/11.  We did not use mesh.  The patient is doing quite well.  No abdominal pain.  The wound is healing well with no sign of infection or hematoma.  No recurrent hernia.  No problems with peritoneal dialysis.  She continues her work-up for her upcoming renal transplant.  Incision - well-healed No sign of recurrent hernia. No drainage.  Imp - s/p repair of incarcerated ventral hernia  Plan - She may begin resuming a normal level of activity and regular diet.  PRN abdominal binder.  Follow-up PRN.  Wilmon Arms. Corliss Skains, MD, [facility]  08/04/2011 5:00 PM

## 2011-09-14 DIAGNOSIS — Z940 Kidney transplant status: Secondary | ICD-10-CM

## 2011-09-14 HISTORY — DX: Kidney transplant status: Z94.0

## 2011-09-23 DIAGNOSIS — Z940 Kidney transplant status: Secondary | ICD-10-CM | POA: Insufficient documentation

## 2011-09-23 DIAGNOSIS — L409 Psoriasis, unspecified: Secondary | ICD-10-CM | POA: Insufficient documentation

## 2011-09-23 DIAGNOSIS — Z79899 Other long term (current) drug therapy: Secondary | ICD-10-CM | POA: Insufficient documentation

## 2012-06-15 ENCOUNTER — Other Ambulatory Visit: Payer: Self-pay

## 2013-02-16 IMAGING — CR DG CHEST 2V
2 series · 2 of 2 positions shown · non-contrast
Comparison: 07/14/2005

CLINICAL DATA: Preop peritoneal dialysis catheter insertion

CHEST - 2 VIEW

[view not recorded (1 of 2)]
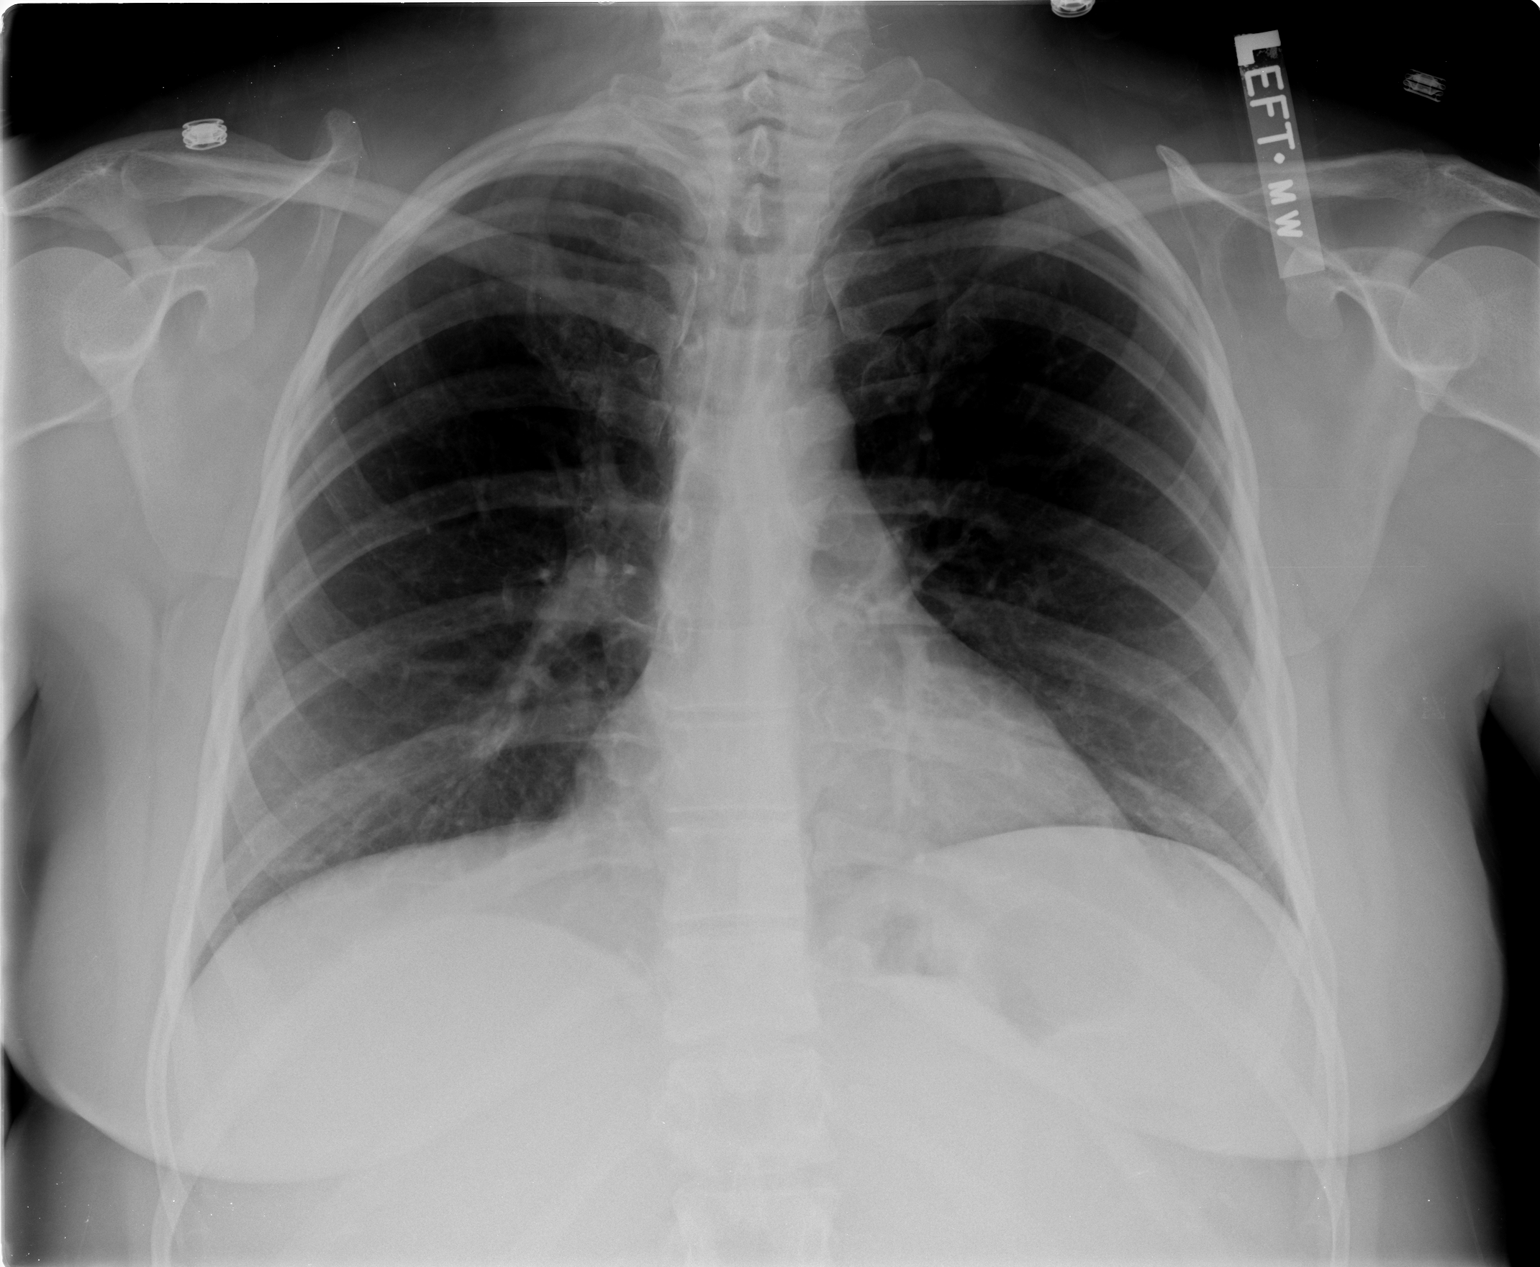

[view not recorded (2 of 2)]
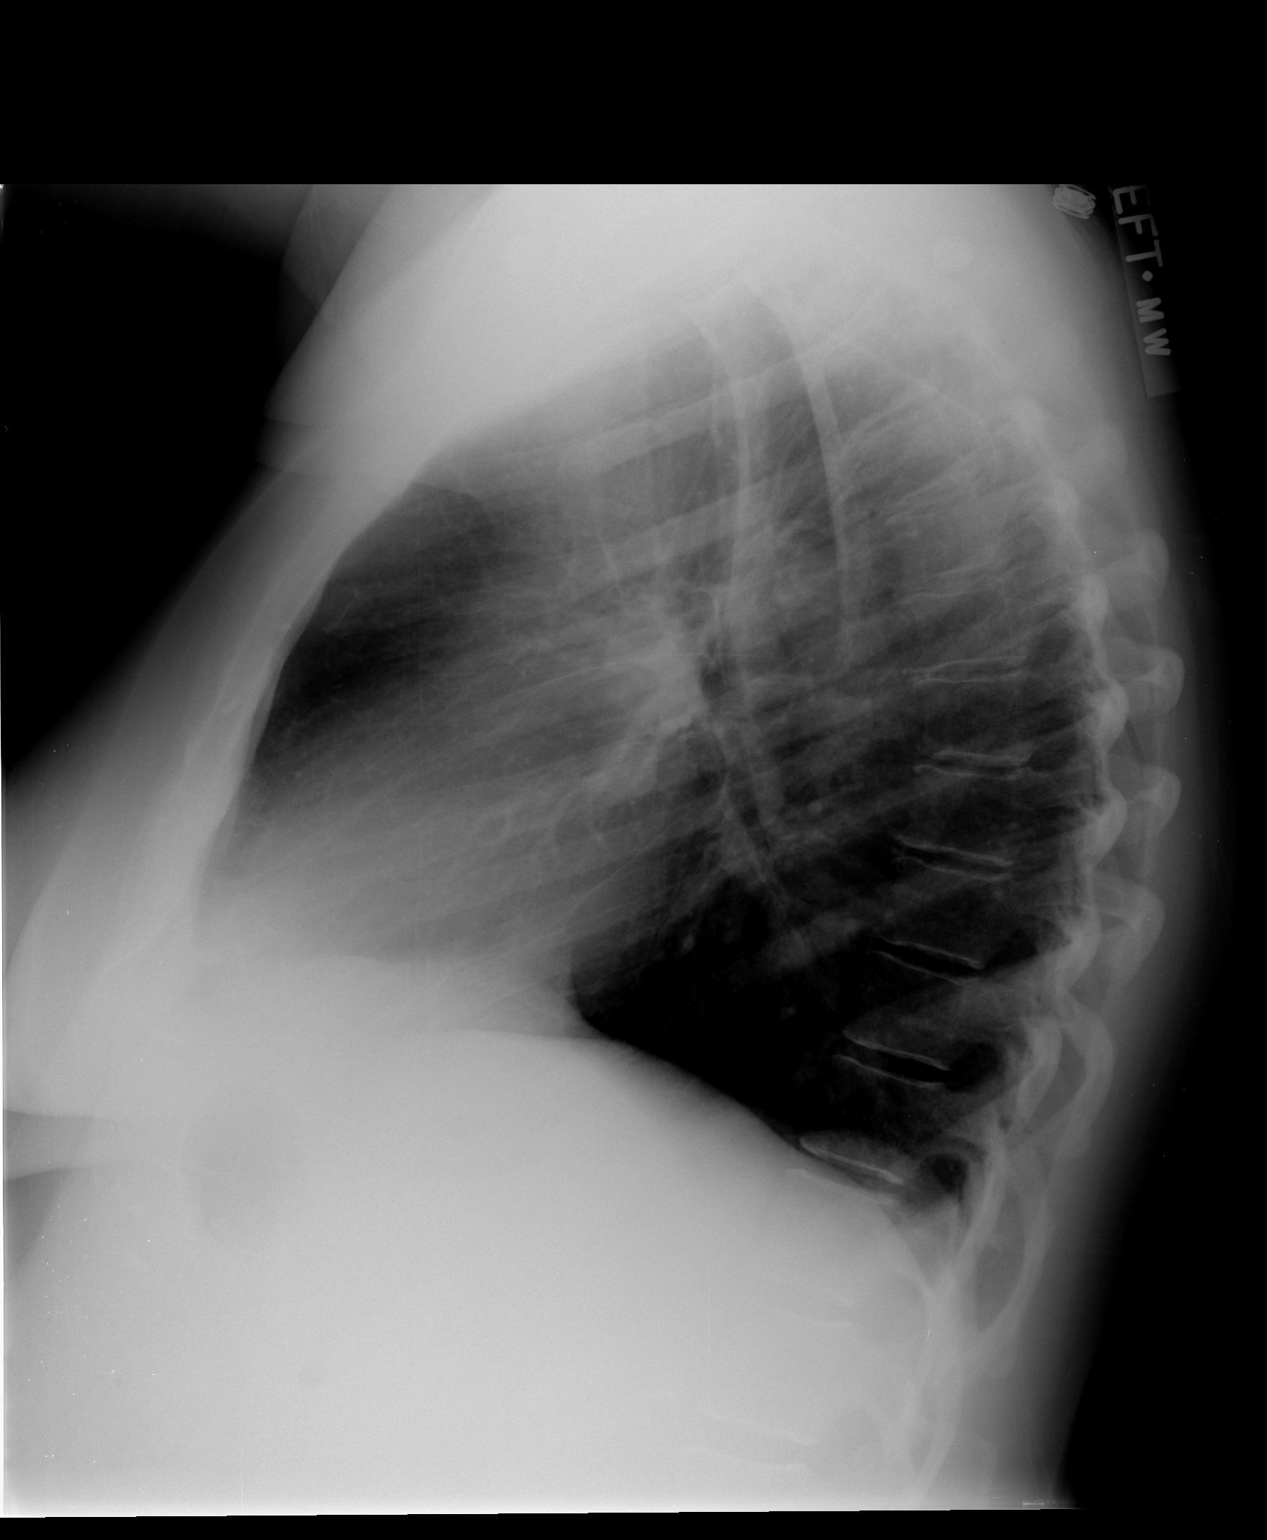

[2 of 2 positions shown; findings below may reference images not displayed]

FINDINGS: Normal heart size and normal vascularity.  Lungs are
clear without infiltrate or effusion.
IMPRESSION: No active cardiopulmonary disease.

## 2013-11-23 ENCOUNTER — Emergency Department (HOSPITAL_BASED_OUTPATIENT_CLINIC_OR_DEPARTMENT_OTHER)
Admission: EM | Admit: 2013-11-23 | Discharge: 2013-11-24 | Disposition: A | Discharge status: Home / Self Care | Payer: BC Managed Care – PPO | Attending: Emergency Medicine | Admitting: Emergency Medicine

## 2013-11-23 ENCOUNTER — Encounter (HOSPITAL_BASED_OUTPATIENT_CLINIC_OR_DEPARTMENT_OTHER): Payer: Self-pay | Admitting: Emergency Medicine

## 2013-11-23 DIAGNOSIS — W260XXA Contact with knife, initial encounter: Secondary | ICD-10-CM | POA: Insufficient documentation

## 2013-11-23 DIAGNOSIS — N189 Chronic kidney disease, unspecified: Secondary | ICD-10-CM | POA: Insufficient documentation

## 2013-11-23 DIAGNOSIS — Z7982 Long term (current) use of aspirin: Secondary | ICD-10-CM | POA: Diagnosis not present

## 2013-11-23 DIAGNOSIS — Z792 Long term (current) use of antibiotics: Secondary | ICD-10-CM | POA: Insufficient documentation

## 2013-11-23 DIAGNOSIS — Z872 Personal history of diseases of the skin and subcutaneous tissue: Secondary | ICD-10-CM | POA: Diagnosis not present

## 2013-11-23 DIAGNOSIS — Y9289 Other specified places as the place of occurrence of the external cause: Secondary | ICD-10-CM | POA: Insufficient documentation

## 2013-11-23 DIAGNOSIS — I129 Hypertensive chronic kidney disease with stage 1 through stage 4 chronic kidney disease, or unspecified chronic kidney disease: Secondary | ICD-10-CM | POA: Insufficient documentation

## 2013-11-23 DIAGNOSIS — S61219A Laceration without foreign body of unspecified finger without damage to nail, initial encounter: Secondary | ICD-10-CM

## 2013-11-23 DIAGNOSIS — M109 Gout, unspecified: Secondary | ICD-10-CM | POA: Insufficient documentation

## 2013-11-23 DIAGNOSIS — Z79899 Other long term (current) drug therapy: Secondary | ICD-10-CM | POA: Insufficient documentation

## 2013-11-23 DIAGNOSIS — Y998 Other external cause status: Secondary | ICD-10-CM | POA: Diagnosis not present

## 2013-11-23 DIAGNOSIS — E785 Hyperlipidemia, unspecified: Secondary | ICD-10-CM | POA: Diagnosis not present

## 2013-11-23 DIAGNOSIS — S61217A Laceration without foreign body of left little finger without damage to nail, initial encounter: Secondary | ICD-10-CM | POA: Diagnosis present

## 2013-11-23 DIAGNOSIS — Y9389 Activity, other specified: Secondary | ICD-10-CM | POA: Diagnosis not present

## 2013-11-23 DIAGNOSIS — Q613 Polycystic kidney, unspecified: Secondary | ICD-10-CM | POA: Diagnosis not present

## 2013-11-23 MED ORDER — LIDOCAINE HCL (PF) 1 % IJ SOLN
INTRAMUSCULAR | Status: AC
  Administered 2013-11-23
  Filled 2013-11-23: qty 5

## 2013-11-23 MED ORDER — HYDROCODONE-ACETAMINOPHEN 5-325 MG PO TABS
1.0000 | ORAL_TABLET | Freq: Once | ORAL | Status: AC
  Administered 2013-11-24: 1 via ORAL
  Filled 2013-11-23: qty 1

## 2013-11-23 MED ORDER — CEPHALEXIN 250 MG PO CAPS: 250.0000 mg | ORAL_CAPSULE | Freq: Four times a day (QID) | ORAL | Status: DC

## 2013-11-23 MED ORDER — HYDROCODONE-ACETAMINOPHEN 5-325 MG PO TABS: 1.0000 | ORAL_TABLET | ORAL | Status: DC | PRN

## 2013-11-23 NOTE — ED Provider Notes (Signed)
CSN: [id number]     Arrival date & time 11/23/13  1916 History  This chart was scribed for Julianne Rice, MD by Peyton Bottoms, ED Scribe. This patient was seen in room MHFT1/MHFT1 and the patient's care was started at 11:18 PM.   Chief Complaint  Patient presents with  . Finger Injury   Patient is a 48 y.o. female presenting with skin laceration. The history is provided by the patient. No language interpreter was used.  Laceration Location:  Hand Hand laceration location:  L finger Time since incident:  6 hours Laceration mechanism:  Knife Pain details:    Quality:  Aching and sharp   Severity:  Moderate Foreign body present:  No foreign bodies  HPI Comments: [name] [name] is a 48 y.o. female who presents to the Emergency Department complaining of laceration to 15 get of left hand that occurred 5 hours ago. She states that she accidentally cut her finger with a kitchen knife while cutting potatoes.  Last tetanus shot within the last 2 years. Bleeding controlled with direct pressure. No numbness, decreased range of motion. Complains of pain to the site. Past Medical History  Diagnosis Date  . Eczema   . Psoriasis   . Hypertension   . Enlarged kidney     bilateral  . Chronic kidney disease   . Gout     previously  . Polycystic kidney disease   . Hyperlipidemia    Past Surgical History  Procedure Laterality Date  . Cesarean section  1995 and 1997  . Abdominal hysterectomy  2000?    complete  . Cataract extraction  2011    bilateral  . Peritoneal catheter insertion    . Ventral hernia repair  07/04/2011    Procedure: HERNIA REPAIR VENTRAL ADULT;  Surgeon: Imogene Burn. Georgette Dover, MD;  Location: [place];  Service: General;  Laterality: N/A;  . Hernia repair  7680    umbilical hernia  . Hernia repair  2013  . Nephrectomy transplanted organ     Family History  Problem Relation Age of Onset  . Cancer Sister 52    cancer that developed in the fatty tissues of the muscle  .  Hypertension Father   . Hypertension Brother   . Polycystic kidney disease Father     died before starting dialysis was near ESRD  . Polycystic kidney disease Brother     transplant  7 years ago  . Cerebral aneurysm Father     died age 37   History  Substance Use Topics  . Smoking status: Never Smoker   . Smokeless tobacco: Not on file  . Alcohol Use: No   OB History    No data available     Review of Systems  Skin: Positive for wound.  Neurological: Negative for weakness and numbness.  All other systems reviewed and are negative.  Allergies  Review of patient's allergies indicates no known allergies.  Home Medications   Prior to Admission medications   Medication Sig Start Date End Date Taking? Authorizing Provider  aspirin 325 MG tablet Take 325 mg by mouth daily.   Yes Historical Provider, MD  mycophenolate (MYFORTIC) 360 MG TBEC EC tablet Take 720 mg by mouth 2 (two) times daily.   Yes Historical Provider, MD  tacrolimus (PROGRAF) 1 MG capsule Take 3.5 mg by mouth 2 (two) times daily.   Yes Historical Provider, MD  allopurinol (ZYLOPRIM) 100 MG tablet Take 100 mg by mouth daily.  08/07/10   Historical  Provider, MD  atorvastatin (LIPITOR) 40 MG tablet Take 40 mg by mouth daily.  08/07/10   Historical Provider, MD  calcitRIOL (ROCALTROL) 0.5 MCG capsule Take 0.5 mcg by mouth every other day.  08/09/10   Historical Provider, MD  cephALEXin (KEFLEX) 250 MG capsule Take 1 capsule (250 mg total) by mouth 4 (four) times daily. 11/23/13   Julianne Rice, MD  Docusate Calcium (STOOL SOFTENER PO) Take 100 mg by mouth 2 (two) times daily as needed. For constipation    Historical Provider, MD  furosemide (LASIX) 40 MG tablet Take 40 mg by mouth 2 (two) times daily.  08/07/10   Historical Provider, MD  HYDROcodone-acetaminophen (NORCO) 5-325 MG per tablet Take 1 tablet by mouth every 4 (four) hours as needed for moderate pain. 11/23/13   Julianne Rice, MD  Multiple Vitamin  (MULTIVITAMIN) capsule Take 1 capsule by mouth daily.      Historical Provider, MD  omeprazole (PRILOSEC) 20 MG capsule Take 20 mg by mouth daily.      Historical Provider, MD  potassium chloride SA (K-DUR,KLOR-CON) 20 MEQ tablet Take 2 tablets (40 mEq total) by mouth daily. 07/07/11 07/06/12  Samuella Cota, MD  traMADol (ULTRAM) 50 MG tablet Take 50 mg by mouth 2 (two) times daily as needed. For pain 08/13/10   Historical Provider, MD   Triage Vitals: BP 143/84 mmHg  Pulse 96  Temp(Src) 98.1 F (36.7 C) (Oral)  Resp 20  Ht 5\' 6"  (1.676 m)  Wt 268 lb (121.564 kg)  BMI 43.28 kg/m2  SpO2 94% Physical Exam  Constitutional: She is oriented to person, place, and time. She appears well-developed and well-nourished. No distress.  HENT:  Head: Normocephalic and atraumatic.  Eyes: Pupils are equal, round, and reactive to light.  Neck: Normal range of motion. Neck supple.  Cardiovascular: Normal rate.   Pulmonary/Chest: Effort normal. She has no wheezes.  Abdominal: Soft.  Musculoskeletal: Normal range of motion. She exhibits no edema or tenderness.  Full range of motion of all joints of the left fifth digit. Distal cap refill normal  Neurological: She is alert and oriented to person, place, and time.  Sensation of the left fifth digit intact. Good strength with extension and flexion.  Skin: Skin is warm and dry. No rash noted. No erythema.  1.5 cm laceration to the radial surface of the distal left fifth digit. Does not violate the nail bed. Mild active bleeding. No pulsatile bleeding. No obvious foreign bodies.  Psychiatric: She has a normal mood and affect. Her behavior is normal.  Nursing note and vitals reviewed.   ED Course  Procedures (including critical care time)  DIAGNOSTIC STUDIES: Oxygen Saturation is 94% on RA, low by my interpretation.    COORDINATION OF CARE: 11:22 PM- Discussed plans to apply sutures to injured area. Pt advised of plan for treatment and pt  agrees.  Labs Review Labs Reviewed - No data to display  Imaging Review No results found.   EKG Interpretation None     MDM   Final diagnoses:  Laceration of finger of left hand, initial encounter      I personally performed the services described in this documentation, which was scribed in my presence. The recorded information has been reviewed and is accurate.  Repaired in the emergency department by PA. See PAs procedure note. No obvious tendon injury. Given extensive return precautions and is voiced understanding. Patient will need to have sutures removed in 7-10 days.  Julianne Rice, MD 11/24/13  0306 

## 2013-11-23 NOTE — ED Notes (Signed)
Pt  sts she cut left 5th digit with knife while cutting potatoes. Bleeding controlled, wrapped with bandage.

## 2013-11-23 NOTE — Discharge Instructions (Signed)
Follow-up with your primary doctor in 1 week to have sutures removed. Return immediately to the emergency department for any increase swelling, redness, warmth, fever, decreased movement, or for any concerns.  Fingertip Injuries and Amputations Fingertip injuries are common and often get injured because they are last to escape when pulling your hand out of harm's way. You have amputated (cut off) part of your finger. How this turns out depends largely on how much was amputated. If just the tip is amputated, often the end of the finger will grow back and the finger may return to much the same as it was before the injury.  If more of the finger is missing, your caregiver has done the best with the tissue remaining to allow you to keep as much finger as is possible. Your caregiver after checking your injury has tried to leave you with a painless fingertip that has durable, feeling skin. If possible, your caregiver has tried to maintain the finger's length and appearance and preserve its fingernail.  Please read the instructions outlined below and refer to this sheet in the next few weeks. These instructions provide you with general information on caring for yourself. Your caregiver may also give you specific instructions. While your treatment has been done according to the most current medical practices available, unavoidable complications occasionally occur. If you have any problems or questions after discharge, please call your caregiver. HOME CARE INSTRUCTIONS   You may resume normal diet and activities as directed or allowed.  Keep your hand elevated above the level of your heart. This helps decrease pain and swelling.  Keep ice packs (or a bag of ice wrapped in a towel) on the injured area for 15-20 minutes, 03-04 times per day, for the first two days.  Change dressings if necessary or as directed.  Clean the wound daily or as directed.  Only take over-the-counter or prescription medicines for  pain, discomfort, or fever as directed by your caregiver.  Keep appointments as directed. SEEK IMMEDIATE MEDICAL CARE IF:  You develop redness, swelling, numbness or increasing pain in the wound.  There is pus coming from the wound.  You develop an unexplained oral temperature above 102 F (38.9 C) or as your caregiver suggests.  There is a foul (bad) smell coming from the wound or dressing.  There is a breaking open of the wound (edges not staying together) after sutures or staples have been removed. MAKE SURE YOU:   Understand these instructions.  Will watch your condition.  Will get help right away if you are not doing well or get worse. Document Released: 11/17/2004 Document Revised: 03/21/2011 Document Reviewed: 10/17/2007 [facility] Patient Information 2015 [place], [place]. This information is not intended to replace advice given to you by your health care provider. Make sure you discuss any questions you have with your health care provider.

## 2013-11-23 NOTE — ED Provider Notes (Signed)
11:54 PM   LACERATION REPAIR Performed by: Alvina Chou Authorized by: Alvina Chou Consent: Verbal consent obtained. Risks and benefits: risks, benefits and alternatives were discussed Consent given by: patient Patient identity confirmed: provided demographic data Prepped and Draped in normal sterile fashion Wound explored  Laceration Location: distal left 5th finger  Laceration Length: 1.5 cm  No Foreign Bodies seen or palpated  Anesthesia: digital block Local anesthetic: lidocaine 1% without epinephrine  Anesthetic total: 2 ml  Irrigation method: syringe Amount of cleaning: standard  Skin closure: 6-0 prolene   Number of sutures: 3  Technique: simple  Patient tolerance: Patient tolerated the procedure well with no immediate complications.  NERVE BLOCK Performed by: Alvina Chou Consent: Verbal consent obtained. Required items: required blood products, implants, devices, and special equipment available Time out: Immediately prior to procedure a "time out" was called to verify the correct patient, procedure, equipment, support staff and site/side marked as required.  Indication: wound repair Nerve block body site: digital block, left 5th finger  Preparation: Patient was prepped and draped in the usual sterile fashion. Needle gauge: 24 G Location technique: anatomical landmarks  Local anesthetic: 1% lidocaine without epi  Anesthetic total: 2 ml  Outcome: pain improved Patient tolerance: Patient tolerated the procedure well with no immediate complications.      Alvina Chou, PA-C 11/23/13 [place], MD 11/24/13 [phone]

## 2014-07-09 ENCOUNTER — Other Ambulatory Visit: Payer: Self-pay | Admitting: Endocrinology

## 2014-07-09 DIAGNOSIS — E049 Nontoxic goiter, unspecified: Secondary | ICD-10-CM

## 2014-07-25 ENCOUNTER — Ambulatory Visit
Admission: RE | Admit: 2014-07-25 | Discharge: 2014-07-25 | Disposition: A | Discharge status: Home / Self Care | Payer: BLUE CROSS/BLUE SHIELD | Source: Ambulatory Visit | Attending: Endocrinology | Admitting: Endocrinology

## 2014-07-25 DIAGNOSIS — E049 Nontoxic goiter, unspecified: Secondary | ICD-10-CM

## 2014-07-31 ENCOUNTER — Encounter: Payer: BLUE CROSS/BLUE SHIELD | Attending: Endocrinology | Admitting: *Deleted

## 2014-07-31 ENCOUNTER — Encounter: Payer: Self-pay | Admitting: *Deleted

## 2014-07-31 VITALS — Ht 65.0 in | Wt 279.0 lb

## 2014-07-31 DIAGNOSIS — Z713 Dietary counseling and surveillance: Secondary | ICD-10-CM | POA: Insufficient documentation

## 2014-07-31 DIAGNOSIS — E119 Type 2 diabetes mellitus without complications: Secondary | ICD-10-CM | POA: Diagnosis not present

## 2014-07-31 NOTE — Progress Notes (Signed)
Diabetes Self-Management Education  Visit Type: First/Initial  Appt. Start Time: 0900 Appt. End Time: 1030  07/31/2014  [name], identified by name and date of birth, is a 49 y.o. female with a diagnosis of Diabetes: Type 2.  Other people present during visit:  Patient   ASSESSMENT  Height 5\' 5"  (1.651 m), weight 279 lb (126.554 kg). Body mass index is 46.43 kg/(m^2).  Initial Visit Information:  Are you currently following a meal plan?: Yes What type of meal plan do you follow?: low carb and low salt Are you taking your medications as prescribed?: Yes Are you checking your feet?: Yes How many days per week are you checking your feet?: 5 How often do you need to have someone help you when you read instructions, pamphlets, or other written materials from your doctor or pharmacy?: 1 - Never What is the last grade level you completed in school?: 2 degrees from college  Psychosocial:     Patient Belief/Attitude about Diabetes: Motivated to manage diabetes (small denial) Self-care barriers: None Self-management support: Family Other persons present: Patient Patient Concerns: Nutrition/Meal planning, Medication, Weight Control Special Needs: None Preferred Learning Style: Visual Learning Readiness: Change in progress  Complications:   Last HgB A1C per patient/outside source: 7.3 % How often do you check your blood sugar?:  (twice a week) Fasting Blood glucose range (mg/dL): 130-179 Postprandial Blood glucose range (mg/dL): 130-179 Number of hypoglycemic episodes per month: 0 Have you had a dilated eye exam in the past 12 months?: No Have you had a dental exam in the past 12 months?: Yes  Diet Intake:  Breakfast: glass of milk Snack (morning): nuts recently or pretzels or quiche from bakery next door Lunch: salad or fajita, water  Snack (afternoon): occasionally if not too busy Dinner: meat, occasionally a small starch, salad Snack (evening): depends on timing  of dinner meal, may be milk or popcorn Beverage(s): milk, water   Exercise:  Exercise: ADL's (sporadic due to variable work schedule)  Individualized Plan for Diabetes Self-Management Training:   Learning Objective:  Patient will have a greater understanding of diabetes self-management.  Patient education plan per assessed needs and concerns is to attend individual sessions for     Education Topics Reviewed with Patient Today:  Definition of diabetes, type 1 and 2, and the diagnosis of diabetes Role of diet in the treatment of diabetes and the relationship between the three main macronutrients and blood glucose level, Food label reading, portion sizes and measuring food., Carbohydrate counting Role of exercise on diabetes management, blood pressure control and cardiac health. Reviewed patients medication for diabetes, action, purpose, timing of dose and side effects. Identified appropriate SMBG and/or A1C goals.     Role of stress on diabetes      PATIENTS GOALS/Plan (Developed by the patient):  Nutrition: Follow meal plan discussed Physical Activity: Exercise 3-5 times per week Medications: take my medication as prescribed Monitoring : test blood glucose pre and post meals as discussed  Plan:   Patient Instructions  Plan:  Aim for 2 Carb Choices per meal (30 grams) +/- 1 either way  Aim for 0-1 Carbs per snack if hungry  Include protein in moderation with your meals and snacks Consider reading food labels for Total Carbohydrate of foods Continue your activity level daily as tolerated Continue checking BG at alternate times per day, twice a week as directed by MD  Continue taking medication as directed by MD      Expected Outcomes:  Demonstrated interest in learning. Expect positive outcomes  Education material provided: Living Well with Diabetes, A1C conversion sheet, Meal plan card and Carbohydrate counting sheet  If problems or questions, patient to contact  team via:  Phone and Email  Future DSME appointment: 4-6 wks

## 2014-07-31 NOTE — Patient Instructions (Signed)
Plan:  Aim for 2 Carb Choices per meal (30 grams) +/- 1 either way  Aim for 0-1 Carbs per snack if hungry  Include protein in moderation with your meals and snacks Consider reading food labels for Total Carbohydrate of foods Continue your activity level daily as tolerated Continue checking BG at alternate times per day, twice a week as directed by MD  Continue taking medication as directed by MD

## 2014-09-03 ENCOUNTER — Encounter: Payer: Self-pay | Admitting: *Deleted

## 2014-09-03 ENCOUNTER — Encounter: Payer: BLUE CROSS/BLUE SHIELD | Attending: Endocrinology | Admitting: *Deleted

## 2014-09-03 VITALS — Ht 65.0 in | Wt 277.4 lb

## 2014-09-03 DIAGNOSIS — E119 Type 2 diabetes mellitus without complications: Secondary | ICD-10-CM | POA: Diagnosis not present

## 2014-09-03 DIAGNOSIS — Z713 Dietary counseling and surveillance: Secondary | ICD-10-CM | POA: Diagnosis not present

## 2014-09-03 NOTE — Progress Notes (Signed)
Diabetes Self-Management Education  Visit Type:  Follow-up  Appt. Start Time: 0800 Appt. End Time: 0830  09/03/2014  [name], identified by name and date of birth, is a 49 y.o. female with a diagnosis of Diabetes: Type 2.  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 She is encouraged by his support and expresses optimism on making this type of move with her job. Otherwise she is doing well with all aspects of her Diabetes management at this time. ASSESSMENT  Height 5\' 5"  (1.651 m), weight 277 lb 6.4 oz (125.828 kg). Body mass index is 46.16 kg/(m^2).       Diabetes Self-Management Education - 09/03/14 [phone]    Initial Visit   What type of meal plan do you follow? continues with low carb and low salt   Health Coping   How would you rate your overall health? Good   Psychosocial Assessment   Patient Belief/Attitude about Diabetes Motivated to manage diabetes   Self-care barriers None   Self-management support Family   Patient Concerns Nutrition/Meal planning;Weight Control   Special Needs None   Preferred Learning Style Visual   Learning Readiness Change in progress   Complications   Last HgB A1C per patient/outside source 6.8 %   How often do you check your blood sugar? --  twice a week   Fasting Blood glucose range (mg/dL) 70-129;130-179   Postprandial Blood glucose range (mg/dL) 130-179;70-129   Number of hypoglycemic episodes per month 0   Have you had a dilated eye exam in the past 12 months? No   Have you had a dental exam in the past 12 months? Yes   Are you checking your feet? Yes   How many days per week are you checking your feet? 5   Exercise   Exercise Type Light (walking / raking leaves)  walking more with daughter or husband   How many days per week to you exercise? 2.5   How many minutes per day do you exercise? 20   Total  minutes per week of exercise 50   Patient Education   Nutrition management  Carbohydrate counting;Reviewed blood glucose goals for pre and post meals and how to evaluate the patients' food intake on their blood glucose level.   Physical activity and exercise  Helped patient identify appropriate exercises in relation to his/her diabetes, diabetes complications and other health issue.   Monitoring Identified appropriate SMBG and/or A1C goals.   Individualized Goals (developed by patient)   Nutrition Follow meal plan discussed   Physical Activity Exercise 3-5 times per week   Medications take my medication as prescribed   Monitoring  test blood glucose pre and post meals as discussed   Reducing Risk examine blood glucose patterns   Patient Self-Evaluation of Goals - Patient rates self as meeting previously set goals (% of time)   Nutrition 50 - 75 %   Physical Activity 50 - 75 %   Medications >75%   Monitoring >75%   Problem Solving >75%   Reducing Risk >75%   Health Coping >75%   Outcomes   Program Status Not Completed   Subsequent Visit   Since your last visit have you continued or begun to take your medications as prescribed? Yes   Since your last visit have you experienced any weight changes? Loss   Weight Loss (lbs) 2   Since your  last visit, are you checking your blood glucose at least once a day? No  twice a week      Learning Objective:  Patient will have a greater understanding of diabetes self-management. Patient education plan is to attend individual and/or group sessions per assessed needs and concerns.   Plan:   Patient Instructions  Plan:  Aim for 2 Carb Choices per meal (30 grams) +/- 1 either way  Aim for 0-1 Carbs per snack if hungry  Include protein in moderation with your meals and snacks Continue reading food labels for Total Carbohydrate of foods Continue your activity level daily as tolerated Continue checking BG at alternate times per day, twice a week  as directed by MD  Continue taking medication as directed by MD       Expected Outcomes:  Demonstrated interest in learning. Expect positive outcomes  Education material provided: no new handouts today  If problems or questions, patient to contact team via:  Phone and Email  Future DSME appointment: - 4-6 wks

## 2014-09-03 NOTE — Patient Instructions (Signed)
Plan:  Aim for 2 Carb Choices per meal (30 grams) +/- 1 either way  Aim for 0-1 Carbs per snack if hungry  Include protein in moderation with your meals and snacks Continue reading food labels for Total Carbohydrate of foods Continue your activity level daily as tolerated Continue checking BG at alternate times per day, twice a week as directed by MD  Continue taking medication as directed by MD

## 2014-11-05 ENCOUNTER — Encounter: Payer: Self-pay | Admitting: *Deleted

## 2014-11-05 ENCOUNTER — Encounter: Payer: BLUE CROSS/BLUE SHIELD | Attending: Endocrinology | Admitting: *Deleted

## 2014-11-05 VITALS — Ht 65.0 in | Wt 276.7 lb

## 2014-11-05 DIAGNOSIS — Z713 Dietary counseling and surveillance: Secondary | ICD-10-CM | POA: Diagnosis not present

## 2014-11-05 DIAGNOSIS — E119 Type 2 diabetes mellitus without complications: Secondary | ICD-10-CM

## 2014-11-05 NOTE — Patient Instructions (Signed)
Plan:  Aim for 2 Carb Choices per meal (30 grams) +/- 1 either way  Aim for 0-1 Carbs per snack if hungry  Include protein in moderation with your meals and snacks Continue reading food labels for Total Carbohydrate of foods Continue your activity level daily as tolerated. Consider additional ways to increase activity such as walking inside a store and Arm Chair Exercises Continue checking BG at alternate times per day, twice a week as directed by MD  Continue taking medication as directed by MD Ask you pharmacist about a lancing device to use for alternate site testing

## 2014-11-05 NOTE — Progress Notes (Signed)
Diabetes Self-Management Education  Visit Type:  Follow-up  Appt. Start Time: 0730 Appt. End Time: 0800  11/05/2014  [name] [name], identified by name and date of birth, is a 49 y.o. female with a diagnosis of Diabetes: Type 2.   ASSESSMENT  Height 5\' 5"  (1.651 m), weight 276 lb 11.2 oz (125.51 kg). Body mass index is 46.05 kg/(m^2).       Diabetes Self-Management Education - 11/05/14 0804    Initial Visit   What type of meal plan do you follow? aiming for 2 carb choices per meal, moderate protein sevings and low fat   Health Coping   How would you rate your overall health? Good   Psychosocial Assessment   Patient Belief/Attitude about Diabetes Motivated to manage diabetes   Self-care barriers None   Self-management support Family   Patient Concerns Nutrition/Meal planning;Glycemic Control;Weight Control   Special Needs None   Learning Readiness Change in progress   Complications   Last HgB A1C per patient/outside source 6.8 %   How often do you check your blood sugar? --  patient has fear of needles, is checking twice a day on 2 days a week   Fasting Blood glucose range (mg/dL) 130-179   Postprandial Blood glucose range (mg/dL) 70-129   Number of hypoglycemic episodes per month 0   How many days per week are you checking your feet? 5   Exercise   Exercise Type Light (walking / raking leaves)  Enjoys yard work, is willing to walk as able   How many days per week to you exercise? 3   How many minutes per day do you exercise? 30   Total minutes per week of exercise 90   Patient Education   Nutrition management  Reviewed blood glucose goals for pre and post meals and how to evaluate the patients' food intake on their blood glucose level.   Physical activity and exercise  Helped patient identify appropriate exercises in relation to his/her diabetes, diabetes complications and other health issue.   Monitoring Other (comment)  reviewed options for alternate site testing  lancing devices   Individualized Goals (developed by patient)   Nutrition Follow meal plan discussed   Physical Activity Exercise 3-5 times per week   Medications take my medication as prescribed   Monitoring  test blood glucose pre and post meals as discussed   Reducing Risk examine blood glucose patterns   Patient Self-Evaluation of Goals - Patient rates self as meeting previously set goals (% of time)   Nutrition 50 - 75 %   Physical Activity 50 - 75 %   Medications >75%   Monitoring >75%   Problem Solving >75%   Reducing Risk >75%   Health Coping >75%   Outcomes   Program Status Not Completed   Subsequent Visit   Since your last visit have you continued or begun to take your medications as prescribed? Yes   Since your last visit have you experienced any weight changes? Gain   Weight Loss (lbs) 1   Since your last visit, are you checking your blood glucose at least once a day? No  Checking twice a week, twice each of those days, pre breakfast and post supper      Learning Objective:  Patient will have a greater understanding of diabetes self-management. Patient education plan is to attend individual and/or group sessions per assessed needs and concerns.   Plan:   Patient Instructions  Plan:  Aim for 2 Carb Choices  per meal (30 grams) +/- 1 either way  Aim for 0-1 Carbs per snack if hungry  Include protein in moderation with your meals and snacks Continue reading food labels for Total Carbohydrate of foods Continue your activity level daily as tolerated. Consider additional ways to increase activity such as walking inside a store and Arm Chair Exercises Continue checking BG at alternate times per day, twice a week as directed by MD  Continue taking medication as directed by MD Ask you pharmacist about a lancing device to use for alternate site testing       Expected Outcomes:  Demonstrated interest in learning. Expect positive outcomes  Education material provided:  Arm chair Exercise handout  If problems or questions, patient to contact team via:  Phone and Email  Future DSME appointment: - 4-6 wks

## 2014-12-17 ENCOUNTER — Encounter: Payer: BLUE CROSS/BLUE SHIELD | Attending: Endocrinology | Admitting: *Deleted

## 2014-12-17 ENCOUNTER — Encounter: Payer: Self-pay | Admitting: *Deleted

## 2014-12-17 VITALS — Ht 65.0 in | Wt 271.7 lb

## 2014-12-17 DIAGNOSIS — E119 Type 2 diabetes mellitus without complications: Secondary | ICD-10-CM

## 2014-12-17 DIAGNOSIS — Z713 Dietary counseling and surveillance: Secondary | ICD-10-CM | POA: Insufficient documentation

## 2014-12-17 NOTE — Progress Notes (Signed)
Diabetes Self-Management Education  Visit Type:  Follow-up  Appt. Start Time: 0800 Appt. End Time: 0830  12/17/2014  Ms. Felix Pacini, identified by name and date of birth, is a 49 y.o. female with a diagnosis of Diabetes: Type 2.   ASSESSMENT  Height 5\' 5"  (1.651 m), weight 271 lb 11.2 oz (123.242 kg). Body mass index is 45.21 kg/(m^2).       Diabetes Self-Management Education - [id number]    Complications   Last HgB A1C per patient/outside source 6.8 %   How often do you check your blood sugar? 1-2 times/day  She is doing better with suggestions for lancing her finger using ice to numb it first from her pharmacist   Exercise   Exercise Type Light (walking / raking leaves)   How many days per week to you exercise? 5   How many minutes per day do you exercise? 30   Total minutes per week of exercise 150   Patient Education   Physical activity and exercise  Helped patient identify appropriate exercises in relation to his/her diabetes, diabetes complications and other health issue.  consider some activity after dinner meal to help with FBG    Medications Reviewed patients medication for diabetes, action, purpose, timing of dose and side effects.   Psychosocial adjustment Identified and addressed patients feelings and concerns about diabetes   Individualized Goals (developed by patient)   Nutrition Follow meal plan discussed   Physical Activity Exercise 3-5 times per week   Medications take my medication as prescribed   Monitoring  test blood glucose pre and post meals as discussed   Reducing Risk examine blood glucose patterns   Patient Self-Evaluation of Goals - Patient rates self as meeting previously set goals (% of time)   Nutrition >75%   Physical Activity >75%   Medications >75%   Monitoring >75%   Problem Solving >75%   Reducing Risk >75%   Health Coping >75%   Outcomes   Program Status Completed      Learning Objective:  Patient will have a greater  understanding of diabetes self-management. Patient education plan is to attend individual and/or group sessions per assessed needs and concerns.   Plan:   Patient Instructions  Plan:  Aim for 2 Carb Choices per meal (30 grams) +/- 1 either way  Aim for 0-1 Carbs per snack if hungry  Include protein in moderation with your meals and snacks Continue reading food labels for Total Carbohydrate of foods Continue your activity level daily including walking and Arm Chair Exercises Consider additional exercises after dinner to assist in lowering FBG. Continue checking BG at alternate times per day, twice a week as directed by MD  Continue taking medication as directed by MD        Expected Outcomes:  Demonstrated interest in learning. Expect positive outcomes  Education material provided: No new handouts today  If problems or questions, patient to contact team via:  Phone and Email  Future DSME appointment: - 4-6 wks

## 2014-12-17 NOTE — Patient Instructions (Addendum)
Plan:  Aim for 2 Carb Choices per meal (30 grams) +/- 1 either way  Aim for 0-1 Carbs per snack if hungry  Include protein in moderation with your meals and snacks Continue reading food labels for Total Carbohydrate of foods Continue your activity level daily including walking and Arm Chair Exercises Consider additional exercises after dinner to assist in lowering FBG. Continue checking BG at alternate times per day, twice a week as directed by MD  Continue taking medication as directed by MD

## 2015-01-23 ENCOUNTER — Encounter: Payer: Self-pay | Admitting: *Deleted

## 2015-01-23 ENCOUNTER — Encounter: Payer: BLUE CROSS/BLUE SHIELD | Attending: Endocrinology | Admitting: *Deleted

## 2015-01-23 VITALS — Ht 65.0 in | Wt 273.2 lb

## 2015-01-23 DIAGNOSIS — E119 Type 2 diabetes mellitus without complications: Secondary | ICD-10-CM | POA: Diagnosis not present

## 2015-01-23 DIAGNOSIS — Z713 Dietary counseling and surveillance: Secondary | ICD-10-CM | POA: Diagnosis not present

## 2015-01-23 NOTE — Patient Instructions (Signed)
Plan:  Aim for 2 Carb Choices per meal (30 grams) +/- 1 either way  Aim for 0-1 Carbs per snack if hungry  Include protein in moderation with your meals and snacks Continue reading food labels for Total Carbohydrate of foods Continue your activity level daily including walking and Arm Chair Exercises Consider additional exercises after dinner to assist in lowering FBG. Continue checking BG at alternate times per day, twice a week as directed by MD  Continue taking medication as directed by MD

## 2015-01-29 NOTE — Progress Notes (Signed)
Diabetes Self-Management Education  Visit Type:  Follow-up  Appt. Start Time: 0800 Appt. End Time: 0830  01/29/2015  [name], identified by name and date of birth, is a 50 y.o. female with a diagnosis of Diabetes: Type 2.   ASSESSMENT  Height 5\' 5"  (1.651 m), weight 273 lb 3.2 oz (123.923 kg). Body mass index is 45.46 kg/(m^2).       Diabetes Self-Management Education - 01/29/15 1531    Initial Visit   What type of meal plan do you follow? aiming for 2 carb choices per meal, moderate protein and fat intake   Health Coping   How would you rate your overall health? Good   Psychosocial Assessment   Patient Belief/Attitude about Diabetes Motivated to manage diabetes   Self-care barriers None   Self-management support Family   Patient Concerns Nutrition/Meal planning;Glycemic Control;Weight Control   Special Needs None   Preferred Learning Style Visual   Learning Readiness Change in progress   Complications   Last HgB A1C per patient/outside source 6.8 %   How often do you check your blood sugar? 1-2 times/day   Fasting Blood glucose range (mg/dL) 130-179   Postprandial Blood glucose range (mg/dL) 130-179;70-129   Number of hypoglycemic episodes per month 0   Patient Education   Nutrition management  Carbohydrate counting   Physical activity and exercise  Role of exercise on diabetes management, blood pressure control and cardiac health.;Helped patient identify appropriate exercises in relation to his/her diabetes, diabetes complications and other health issue.   Psychosocial adjustment Helped patient identify a support system for diabetes management   Individualized Goals (developed by patient)   Nutrition Follow meal plan discussed   Physical Activity Exercise 3-5 times per week   Medications take my medication as prescribed   Monitoring  test blood glucose pre and post meals as discussed   Health Coping --  Discussed body image and provided information on support   Patient Self-Evaluation of Goals - Patient rates self as meeting previously set goals (% of time)   Nutrition >75%   Physical Activity >75%   Medications >75%   Monitoring >75%   Problem Solving >75%   Reducing Risk >75%   Health Coping >75%   Outcomes   Program Status Completed   Subsequent Visit   Since your last visit have you continued or begun to take your medications as prescribed? Yes   Since your last visit have you experienced any weight changes? Gain   Weight Gain (lbs) 2   Since your last visit, are you checking your blood glucose at least once a day? Yes      Learning Objective:  Patient will have a greater understanding of diabetes self-management. Patient education plan is to attend individual and/or group sessions per assessed needs and concerns.   Plan:   Patient Instructions  Plan:  Aim for 2 Carb Choices per meal (30 grams) +/- 1 either way  Aim for 0-1 Carbs per snack if hungry  Include protein in moderation with your meals and snacks Continue reading food labels for Total Carbohydrate of foods Continue your activity level daily including walking and Arm Chair Exercises Consider additional exercises after dinner to assist in lowering FBG. Continue checking BG at alternate times per day, twice a week as directed by MD  Continue taking medication as directed by MD      Expected Outcomes:  Demonstrated interest in learning. Expect positive outcomes  Education material provided: Support group flyer  If problems or questions, patient to contact team via:  Phone and Email  Future DSME appointment: - 2 months

## 2015-02-10 ENCOUNTER — Emergency Department (HOSPITAL_COMMUNITY)
Admission: EM | Admit: 2015-02-10 | Discharge: 2015-02-10 | Disposition: A | Discharge status: Home / Self Care | Payer: BLUE CROSS/BLUE SHIELD | Attending: Emergency Medicine | Admitting: Emergency Medicine

## 2015-02-10 ENCOUNTER — Encounter (HOSPITAL_COMMUNITY): Payer: Self-pay | Admitting: *Deleted

## 2015-02-10 ENCOUNTER — Emergency Department (HOSPITAL_COMMUNITY): Payer: BLUE CROSS/BLUE SHIELD

## 2015-02-10 DIAGNOSIS — E785 Hyperlipidemia, unspecified: Secondary | ICD-10-CM | POA: Insufficient documentation

## 2015-02-10 DIAGNOSIS — I129 Hypertensive chronic kidney disease with stage 1 through stage 4 chronic kidney disease, or unspecified chronic kidney disease: Secondary | ICD-10-CM | POA: Insufficient documentation

## 2015-02-10 DIAGNOSIS — R1031 Right lower quadrant pain: Secondary | ICD-10-CM | POA: Diagnosis not present

## 2015-02-10 DIAGNOSIS — Z792 Long term (current) use of antibiotics: Secondary | ICD-10-CM | POA: Insufficient documentation

## 2015-02-10 DIAGNOSIS — E119 Type 2 diabetes mellitus without complications: Secondary | ICD-10-CM | POA: Diagnosis not present

## 2015-02-10 DIAGNOSIS — M109 Gout, unspecified: Secondary | ICD-10-CM | POA: Insufficient documentation

## 2015-02-10 DIAGNOSIS — Z7984 Long term (current) use of oral hypoglycemic drugs: Secondary | ICD-10-CM | POA: Diagnosis not present

## 2015-02-10 DIAGNOSIS — N189 Chronic kidney disease, unspecified: Secondary | ICD-10-CM | POA: Insufficient documentation

## 2015-02-10 DIAGNOSIS — Z87442 Personal history of urinary calculi: Secondary | ICD-10-CM | POA: Insufficient documentation

## 2015-02-10 DIAGNOSIS — Z7982 Long term (current) use of aspirin: Secondary | ICD-10-CM | POA: Diagnosis not present

## 2015-02-10 DIAGNOSIS — R109 Unspecified abdominal pain: Secondary | ICD-10-CM

## 2015-02-10 DIAGNOSIS — Z872 Personal history of diseases of the skin and subcutaneous tissue: Secondary | ICD-10-CM | POA: Diagnosis not present

## 2015-02-10 DIAGNOSIS — Z79899 Other long term (current) drug therapy: Secondary | ICD-10-CM | POA: Insufficient documentation

## 2015-02-10 LAB — URINALYSIS, ROUTINE W REFLEX MICROSCOPIC
BILIRUBIN URINE: NEGATIVE
Glucose, UA: NEGATIVE mg/dL
HGB URINE DIPSTICK: NEGATIVE
Ketones, ur: NEGATIVE mg/dL
Leukocytes, UA: NEGATIVE
Nitrite: NEGATIVE
PROTEIN: NEGATIVE mg/dL
Specific Gravity, Urine: 1.023 (ref 1.005–1.030)
pH: 5 (ref 5.0–8.0)

## 2015-02-10 LAB — CBC
HCT: 38.6 % (ref 36.0–46.0)
Hemoglobin: 12.7 g/dL (ref 12.0–15.0)
MCH: 28.2 pg (ref 26.0–34.0)
MCHC: 32.9 g/dL (ref 30.0–36.0)
MCV: 85.8 fL (ref 78.0–100.0)
PLATELETS: 217 10*3/uL (ref 150–400)
RBC: 4.5 MIL/uL (ref 3.87–5.11)
RDW: 13 % (ref 11.5–15.5)
WBC: 7.7 10*3/uL (ref 4.0–10.5)

## 2015-02-10 LAB — BASIC METABOLIC PANEL
ANION GAP: 12 (ref 5–15)
BUN: 19 mg/dL (ref 6–20)
CALCIUM: 9.2 mg/dL (ref 8.9–10.3)
CO2: 19 mmol/L — ABNORMAL LOW (ref 22–32)
Chloride: 109 mmol/L (ref 101–111)
Creatinine, Ser: 1.23 mg/dL — ABNORMAL HIGH (ref 0.44–1.00)
GFR, EST AFRICAN AMERICAN: 59 mL/min — AB (ref >60)
GFR, EST NON AFRICAN AMERICAN: 51 mL/min — AB (ref >60)
Glucose, Bld: 122 mg/dL — ABNORMAL HIGH (ref 65–99)
Potassium: 4 mmol/L (ref 3.5–5.1)
SODIUM: 140 mmol/L (ref 135–145)

## 2015-02-10 LAB — HEPATIC FUNCTION PANEL
ALBUMIN: 4.4 g/dL (ref 3.5–5.0)
ALT: 25 U/L (ref 14–54)
AST: 20 U/L (ref 15–41)
Alkaline Phosphatase: 79 U/L (ref 38–126)
BILIRUBIN DIRECT: 0.2 mg/dL (ref 0.1–0.5)
Indirect Bilirubin: 0.5 mg/dL (ref 0.3–0.9)
TOTAL PROTEIN: 7.2 g/dL (ref 6.5–8.1)
Total Bilirubin: 0.7 mg/dL (ref 0.3–1.2)

## 2015-02-10 LAB — LIPASE, BLOOD: Lipase: 33 U/L (ref 11–51)

## 2015-02-10 NOTE — ED Notes (Signed)
Pt being sent by [facility] c/o dysuria, urinary frequency, flank pain, and pelvic pain x 1 day.  Hx of kidney transplant in 2013.

## 2015-02-10 NOTE — ED Notes (Signed)
Pt sent from [facility] walk-in clinic today d/t intermittent R flank pain, RLQ pain.  Reports nausea but denies any vomiting. Pt reports she is a kidney recipient/2013.  Reports she has her husband's R kidney which had a small stone in it.  She believes that the kidney stone is dislodged and is causing the pain.  UA and CBC obtained.  No sign of UTI or elevated WBC at this time.  Pt denies any pain at this time.

## 2015-02-10 NOTE — Discharge Instructions (Signed)
Your workup today was unremarkable. Your labs were normal. Your CT scan did not show evidence of a kidney stone or appendicitis. Please call your nephrologist and primary care provider to schedule a follow up appointment within one week. You may try taking tylenol as needed for your pain. Return to the ER for new or worsening symptoms.

## 2015-02-10 NOTE — ED Notes (Signed)
Bed: WA14 Expected date:  Expected time:  Means of arrival:  Comments: 

## 2015-02-10 NOTE — ED Provider Notes (Signed)
CSN: [id number]     Arrival date & time 02/10/15  1540 History   First MD Initiated Contact with Patient 02/10/15 1612     Chief Complaint  Patient presents with  . Flank Pain    HPI   [name] [name] is an 50 y.o. female s/p kidney transplant (2013) who presents to the ED for evaluation of right flank/RLQ pain. She states she was in her usual state of health until yesterday afternoon when she had sudden onset RLQ pain. She states the pain feels like a sharp, stabbing pain that comes in spurts. Denies injury or trauma. States she has not tried anything for the pain. She states that in between the pain flare-ups she feels "off" and has had intermittent nausea but denies other abdominal pain, fever, chills, V/D. She states that since yesterday afternoon she has also had dysuria and urinary frequency. She went to [facility] walk in clinic today and had a normal UA and CBC. However, she states that after leaving their office she remembered that the kidney transplant she received had a kidney stone in it (kidney was from husband) and thinks that this pain could possibly be due to the stone. She denies gross hematuria. States she has been follow up with nephro regularly. She states she spoke to Dr. Clover Mealy [facility]) today who felt this pain is not a sign of rejection. States she still has her appendix. States she is s/p total hysterectomy.    Past Medical History  Diagnosis Date  . Eczema   . Psoriasis   . Hypertension   . Enlarged kidney     bilateral  . Chronic kidney disease   . Gout     previously  . Polycystic kidney disease   . Hyperlipidemia   . Diabetes mellitus without complication [facility])    Past Surgical History  Procedure Laterality Date  . Cesarean section  1995 and 1997  . Abdominal hysterectomy  2000?    complete  . Cataract extraction  2011    bilateral  . Peritoneal catheter insertion    . Ventral hernia repair  07/04/2011    Procedure: HERNIA REPAIR VENTRAL ADULT;  Surgeon:  Imogene Burn. Georgette Dover, MD;  Location: [place];  Service: General;  Laterality: N/A;  . Hernia repair  [id number]    umbilical hernia  . Hernia repair  2013  . Nephrectomy transplanted organ     Family History  Problem Relation Age of Onset  . Cancer Sister 44    cancer that developed in the fatty tissues of the muscle  . Hypertension Father   . Hypertension Brother   . Polycystic kidney disease Father     died before starting dialysis was near ESRD  . Polycystic kidney disease Brother     transplant  7 years ago  . Cerebral aneurysm Father     died age 69   Social History  Substance Use Topics  . Smoking status: Never Smoker   . Smokeless tobacco: None  . Alcohol Use: No   OB History    No data available     Review of Systems  All other systems reviewed and are negative.     Allergies  Vancomycin  Home Medications   Prior to Admission medications   Medication Sig Start Date End Date Taking? Authorizing Provider  allopurinol (ZYLOPRIM) 100 MG tablet Take 100 mg by mouth daily.  08/07/10   Historical Provider, MD  aspirin 325 MG tablet Take 325 mg by mouth daily.  Historical Provider, MD  atorvastatin (LIPITOR) 40 MG tablet Take 40 mg by mouth daily.  08/07/10   Historical Provider, MD  calcitRIOL (ROCALTROL) 0.5 MCG capsule Take 0.5 mcg by mouth every other day.  08/09/10   Historical Provider, MD  cephALEXin (KEFLEX) 250 MG capsule Take 1 capsule (250 mg total) by mouth 4 (four) times daily. 11/23/13   Julianne Rice, MD  Docusate Calcium (STOOL SOFTENER PO) Take 100 mg by mouth 2 (two) times daily as needed. For constipation    Historical Provider, MD  furosemide (LASIX) 40 MG tablet Take 40 mg by mouth 2 (two) times daily.  08/07/10   Historical Provider, MD  HYDROcodone-acetaminophen (NORCO) 5-325 MG per tablet Take 1 tablet by mouth every 4 (four) hours as needed for moderate pain. 11/23/13   Julianne Rice, MD  linagliptin (TRADJENTA) 5 MG TABS tablet Take 5 mg by mouth  daily.    Historical Provider, MD  Multiple Vitamin (MULTIVITAMIN) capsule Take 1 capsule by mouth daily.      Historical Provider, MD  mycophenolate (MYFORTIC) 360 MG TBEC EC tablet Take 720 mg by mouth 2 (two) times daily.    Historical Provider, MD  omeprazole (PRILOSEC) 20 MG capsule Take 20 mg by mouth daily.      Historical Provider, MD  potassium chloride SA (K-DUR,KLOR-CON) 20 MEQ tablet Take 2 tablets (40 mEq total) by mouth daily. 07/07/11 07/06/12  Samuella Cota, MD  tacrolimus (PROGRAF) 1 MG capsule Take 3.5 mg by mouth 2 (two) times daily.    Historical Provider, MD  traMADol (ULTRAM) 50 MG tablet Take 50 mg by mouth 2 (two) times daily as needed. For pain 08/13/10   Historical Provider, MD   BP 136/92 mmHg  Pulse 88  Temp(Src) 97.5 F (36.4 C) (Oral)  Resp 16  SpO2 96% Physical Exam  Constitutional: She is oriented to person, place, and time.  HENT:  Right Ear: External ear normal.  Left Ear: External ear normal.  Nose: Nose normal.  Mouth/Throat: Oropharynx is clear and moist. No oropharyngeal exudate.  Eyes: Conjunctivae and EOM are normal. Pupils are equal, round, and reactive to light.  Neck: Normal range of motion. Neck supple.  Cardiovascular: Normal rate, regular rhythm, normal heart sounds and intact distal pulses.   Pulmonary/Chest: Effort normal and breath sounds normal. No respiratory distress. She has no wheezes. She exhibits no tenderness.  Abdominal: Soft. Bowel sounds are normal. She exhibits no distension.  Marked RLQ tenderness with guarding. Abdomen is soft. No CVA tenderness. No rebound. No rovsing's.  Musculoskeletal: She exhibits no edema.  Neurological: She is alert and oriented to person, place, and time. No cranial nerve deficit.  Skin: Skin is warm and dry.  Psychiatric: She has a normal mood and affect.  Nursing note and vitals reviewed.   ED Course  Procedures (including critical care time) Labs Review Labs Reviewed  URINALYSIS, ROUTINE W  REFLEX MICROSCOPIC (NOT AT [facility]) - Abnormal; Notable for the following:    APPearance CLOUDY (*)    All other components within normal limits  BASIC METABOLIC PANEL - Abnormal; Notable for the following:    CO2 19 (*)    Glucose, Bld 122 (*)    Creatinine, Ser 1.23 (*)    GFR calc non Af Amer 51 (*)    GFR calc Af Amer 59 (*)    All other components within normal limits  CBC  HEPATIC FUNCTION PANEL  LIPASE, BLOOD  TACROLIMUS LEVEL    Imaging  Review Ct Renal Stone Study  02/10/2015  CLINICAL DATA:  Right-sided abdominal pain. EXAM: CT ABDOMEN AND PELVIS WITHOUT CONTRAST TECHNIQUE: Multidetector CT imaging of the abdomen and pelvis was performed following the standard protocol without IV contrast. COMPARISON:  CT scan of July 04, 2011. FINDINGS: Visualized lung bases are unremarkable. No significant osseous abnormality is noted. No gallstones are noted. Fatty infiltration of the liver is noted. Stable hepatic cysts are noted. The spleen and pancreas appear normal. Adrenal glands appear normal. Status post bilateral nephrectomy. Renal transplant is seen in right pelvis. Prominent extrarenal pelvis is noted, but no definite hydronephrosis is seen. Sigmoid diverticulosis is noted without inflammation. The appendix appears normal. There is no evidence of bowel obstruction. There is interval development of 2 fat containing ventral hernias in central portion of abdomen. Urinary bladder appears normal. Status post hysterectomy. IMPRESSION: Fatty infiltration of the liver.  Stable hepatic cysts. Status post bilateral nephrectomy. Sigmoid diverticulosis is noted without inflammation. Renal transplant is noted in right pelvis without definite hydronephrosis. Interval development of 2 adjacent fat containing ventral hernias in central portion of abdomen. Electronically Signed   By: Marijo Conception, M.D.   On: 02/10/2015 17:39   I have personally reviewed and evaluated these images and lab results as part of  my medical decision-making.   EKG Interpretation None      MDM   Final diagnoses:  RLQ abdominal pain  Right flank pain    Ddx initially renal stone vs appendicitis. However, given pt's known h/o stone in her kidney with urinary symptoms, CT renal study ordered. CBC, CMP, lipase, and UA obtained. All workup negative. WBC unremarkable. UA shows no infection or blood. BMP at baseline. CT renal study shows extrarenal pelvis but no e/o renal stone and no hydronephrosis. Pt declined pain meds today. Instructed to f/u with nephrology and PCP this week. ER return precautions given. Pt verbalized understanding and agreement.    Anne Ng, PA-C 02/10/15 [place], DO 02/10/15 2250

## 2015-02-11 LAB — TACROLIMUS LEVEL: TACROLIMUS (FK506) - LABCORP: 6.8 ng/mL (ref 2.0–20.0)

## 2015-03-25 ENCOUNTER — Encounter: Payer: BLUE CROSS/BLUE SHIELD | Attending: Endocrinology | Admitting: *Deleted

## 2015-03-25 VITALS — Ht 65.0 in | Wt 276.7 lb

## 2015-03-25 DIAGNOSIS — E119 Type 2 diabetes mellitus without complications: Secondary | ICD-10-CM | POA: Insufficient documentation

## 2015-03-25 DIAGNOSIS — Z713 Dietary counseling and surveillance: Secondary | ICD-10-CM | POA: Diagnosis not present

## 2015-04-01 ENCOUNTER — Encounter: Payer: Self-pay | Admitting: *Deleted

## 2015-04-01 NOTE — Patient Instructions (Signed)
Plan:  Aim for 2 Carb Choices per meal (30 grams) +/- 1 either way  Aim for 0-1 Carbs per snack if hungry  Include protein in moderation with your meals and snacks Continue reading food labels for Total Carbohydrate of foods Continue your activity level daily including walking and Arm Chair Exercises Consider additional exercises after dinner to assist in lowering FBG. Continue checking BG at alternate times per day, twice a week as directed by MD  Continue taking medication as directed by MD

## 2015-04-01 NOTE — Progress Notes (Signed)
Diabetes Self-Management Education  Visit Type:  Follow-up  Appt. Start Time: 0830 Appt. End Time: 0900  04/01/2015  [name], identified by name and date of birth, is a 50 y.o. female with a diagnosis of Diabetes: Type 2.  She is still adjusting to new work schedule but has a supportive boss who is willing to work with her schedule to allow time for exercise. ASSESSMENT  Height 5\' 5"  (1.651 m), weight 276 lb 11.2 oz (125.51 kg). Body mass index is 46.05 kg/(m^2).       Diabetes Self-Management Education - 03/25/15 1041    Psychosocial Assessment   Patient Belief/Attitude about Diabetes Motivated to manage diabetes  under stress of new job and some medical issues that limit her activity level   Self-care barriers None   Self-management support Family;CDE visits   Patient Concerns Nutrition/Meal planning;Glycemic Control;Weight Control   Special Needs None   Learning Readiness Change in progress   Complications   Last HgB A1C per patient/outside source 6.8 %   Exercise   Exercise Type Light (walking / raking leaves)   Patient Education   Nutrition management  Carbohydrate counting   Physical activity and exercise  Helped patient identify appropriate exercises in relation to his/her diabetes, diabetes complications and other health issue.   Psychosocial adjustment Helped patient identify a support system for diabetes management   Individualized Goals (developed by patient)   Nutrition Follow meal plan discussed   Physical Activity Exercise 3-5 times per week   Medications take my medication as prescribed   Monitoring  test blood glucose pre and post meals as discussed   Reducing Risk examine blood glucose patterns   Patient Self-Evaluation of Goals - Patient rates self as meeting previously set goals (% of time)   Nutrition >75%   Physical Activity >75%   Medications >75%   Monitoring >75%   Problem Solving >75%   Reducing Risk >75%   Health Coping >75%   Outcomes    Program Status Completed   Subsequent Visit   Since your last visit have you continued or begun to take your medications as prescribed? Yes   Since your last visit have you experienced any weight changes? Gain   Weight Gain (lbs) 3   Since your last visit, are you checking your blood glucose at least once a day? Yes      Learning Objective:  Patient will have a greater understanding of diabetes self-management. Patient education plan is to attend individual and/or group sessions per assessed needs and concerns.   Plan:   Patient Instructions  Plan:  Aim for 2 Carb Choices per meal (30 grams) +/- 1 either way  Aim for 0-1 Carbs per snack if hungry  Include protein in moderation with your meals and snacks Continue reading food labels for Total Carbohydrate of foods Continue your activity level daily including walking and Arm Chair Exercises Consider additional exercises after dinner to assist in lowering FBG. Continue checking BG at alternate times per day, twice a week as directed by MD  Continue taking medication as directed by MD        Expected Outcomes:  Demonstrated interest in learning. Expect positive outcomes  Education material provided: no new handouts today  If problems or questions, patient to contact team via:  Phone and Email  Future DSME appointment: - 2 months

## 2015-05-01 DIAGNOSIS — Z48298 Encounter for aftercare following other organ transplant: Secondary | ICD-10-CM | POA: Diagnosis not present

## 2015-05-01 DIAGNOSIS — Z940 Kidney transplant status: Secondary | ICD-10-CM | POA: Diagnosis not present

## 2015-05-25 DIAGNOSIS — Z940 Kidney transplant status: Secondary | ICD-10-CM | POA: Diagnosis not present

## 2015-05-25 DIAGNOSIS — Z79899 Other long term (current) drug therapy: Secondary | ICD-10-CM | POA: Diagnosis not present

## 2015-05-25 DIAGNOSIS — I129 Hypertensive chronic kidney disease with stage 1 through stage 4 chronic kidney disease, or unspecified chronic kidney disease: Secondary | ICD-10-CM | POA: Diagnosis not present

## 2015-05-25 DIAGNOSIS — E669 Obesity, unspecified: Secondary | ICD-10-CM | POA: Diagnosis not present

## 2015-05-28 ENCOUNTER — Encounter: Payer: BLUE CROSS/BLUE SHIELD | Attending: Endocrinology | Admitting: *Deleted

## 2015-05-28 VITALS — Ht 65.0 in | Wt 280.8 lb

## 2015-05-28 DIAGNOSIS — Z713 Dietary counseling and surveillance: Secondary | ICD-10-CM | POA: Diagnosis not present

## 2015-05-28 DIAGNOSIS — E119 Type 2 diabetes mellitus without complications: Secondary | ICD-10-CM | POA: Diagnosis not present

## 2015-05-28 NOTE — Patient Instructions (Signed)
Plan:  Aim for 2 Carb Choices per meal (30 grams) +/- 1 either way  Aim for 0-1 Carbs per snack if hungry  Include protein in moderation with your meals and snacks Continue reading food labels for Total Carbohydrate of foods Continue your activity level daily including walking and Arm Chair Exercises Consider additional exercises after dinner to assist in lowering FBG. Continue checking BG at alternate times per day, twice a week as directed by MD  Continue taking medication as directed by MD

## 2015-05-28 NOTE — Progress Notes (Signed)
Diabetes Self-Management Education  Visit Type:  Follow-up  Appt. Start Time: 0800 Appt. End Time: 0830  06/02/2015  [name], identified by name and date of birth, is a 50 y.o. female with a diagnosis of Diabetes: Type 2.  She is still adjusting to new work schedule but has a supportive boss who is willing to work with her schedule to allow time for exercise. ASSESSMENT  Height 5\' 5"  (1.651 m), weight 280 lb 12.8 oz (127.37 kg). Body mass index is 46.73 kg/(m^2).       Diabetes Self-Management Education - 06/02/15 1400    Health Coping   How would you rate your overall health? Good   Complications   Last HgB A1C per patient/outside source 6.5 %  6.5   Patient Education   Nutrition management  Carbohydrate counting   Physical activity and exercise  Helped patient identify appropriate exercises in relation to his/her diabetes, diabetes complications and other health issue.   Individualized Goals (developed by patient)   Nutrition Follow meal plan discussed   Physical Activity Exercise 3-5 times per week   Medications take my medication as prescribed   Monitoring  test blood glucose pre and post meals as discussed   Reducing Risk examine blood glucose patterns   Patient Self-Evaluation of Goals - Patient rates self as meeting previously set goals (% of time)   Nutrition 50 - 75 %   Physical Activity 50 - 75 %   Medications >75%   Monitoring >75%   Problem Solving >75%   Reducing Risk >75%   Health Coping >75%   Outcomes   Program Status Completed   Subsequent Visit   Since your last visit have you experienced any weight changes? Gain   Weight Gain (lbs) 4   Since your last visit, are you checking your blood glucose at least once a day? Yes      Learning Objective:  Patient will have a greater understanding of diabetes self-management. Patient education plan is to attend individual and/or group sessions per assessed needs and concerns.   Plan:   Patient  Instructions  Plan:  Aim for 2 Carb Choices per meal (30 grams) +/- 1 either way  Aim for 0-1 Carbs per snack if hungry  Include protein in moderation with your meals and snacks Continue reading food labels for Total Carbohydrate of foods Continue your activity level daily including walking and Arm Chair Exercises Consider additional exercises after dinner to assist in lowering FBG. Continue checking BG at alternate times per day, twice a week as directed by MD  Continue taking medication as directed by MD         Expected Outcomes:  Demonstrated interest in learning. Expect positive outcomes  Education material provided: no new handouts today  If problems or questions, patient to contact team via:  Phone and Email  Future DSME appointment: - 3-4 months

## 2015-06-02 ENCOUNTER — Encounter: Payer: Self-pay | Admitting: *Deleted

## 2015-08-04 DIAGNOSIS — Z940 Kidney transplant status: Secondary | ICD-10-CM | POA: Diagnosis not present

## 2015-08-04 DIAGNOSIS — Z48298 Encounter for aftercare following other organ transplant: Secondary | ICD-10-CM | POA: Diagnosis not present

## 2015-08-14 ENCOUNTER — Encounter: Payer: BLUE CROSS/BLUE SHIELD | Attending: Endocrinology | Admitting: *Deleted

## 2015-08-14 DIAGNOSIS — Z713 Dietary counseling and surveillance: Secondary | ICD-10-CM | POA: Insufficient documentation

## 2015-08-14 DIAGNOSIS — E119 Type 2 diabetes mellitus without complications: Secondary | ICD-10-CM | POA: Diagnosis not present

## 2015-08-14 NOTE — Progress Notes (Signed)
Diabetes Self-Management Education  Visit Type:  Follow-up  Appt. Start Time: 0730 Appt. End Time: 0800  08/14/2015  [name], identified by name and date of birth, is a 50 y.o. female with a diagnosis of Diabetes: Type 2.  She is pleased with 2 pound weight loss today. 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 She has had to travel more with this added work, but she likes it so far. It does require more eating out, but she is trying to make healthy choices. She also has more access to pools when she travels, which she really likes.   ASSESSMENT  Height 5\' 5"  (1.651 m), weight 278 lb 11.2 oz (126.4 kg). Body mass index is 46.38 kg/m.       Diabetes Self-Management Education - 08/14/15 1500      Health Coping   How would you rate your overall health? Good     Psychosocial Assessment   Patient Belief/Attitude about Diabetes Motivated to manage diabetes     Complications   Last HgB A1C per patient/outside source --  6.5   How often do you check your blood sugar? 1-2 times/day   Fasting Blood glucose range (mg/dL) 237-628   Number of hypoglycemic episodes per month --  0     Exercise   Exercise Type Light (walking / raking leaves)   How many days per week to you exercise? --  3.5   How many minutes per day do you exercise? 20     Patient Education   Nutrition management  Carbohydrate counting   Physical activity and exercise  Helped patient identify appropriate exercises in relation to his/her diabetes, diabetes complications and other health issue.   Monitoring Identified appropriate SMBG and/or A1C goals.     Individualized Goals (developed by patient)   Nutrition Follow meal plan discussed   Physical Activity Exercise 3-5 times per week   Medications take my medication as prescribed   Monitoring  test blood glucose pre and post meals as discussed     Patient Self-Evaluation of Goals - Patient rates self as meeting previously set goals (% of time)   Nutrition >75%   Physical Activity 50 - 75 %   Medications >75%   Monitoring >75%   Problem Solving >75%   Reducing Risk >75%   Health Coping >75%     Outcomes   Program Status Completed     Subsequent Visit   Since your last visit have you continued or begun to take your medications as prescribed? Yes   Since your last visit have you experienced any weight changes? Loss   Weight Loss (lbs) --  2   Since your last visit, are you checking your blood glucose at least once a day? Yes      Learning Objective:  Patient will have a greater understanding of diabetes self-management. Patient education plan is to attend individual and/or group sessions per assessed needs and concerns.   Plan:   Patient Instructions  Plan:  Aim for 2 Carb Choices per meal (30 grams) +/- 1 either way  Aim for 0-1 Carbs per snack if hungry  Include protein in moderation with your meals and snacks Continue reading food labels for Total Carbohydrate of foods Continue your activity level daily including walking and Arm Chair Exercises Continue additional exercises after dinner to assist in lowering FBG. Continue checking BG  at alternate times per day, twice a week as directed by MD  Continue taking medication as directed by MD        Expected Outcomes:  Demonstrated interest in learning. Expect positive outcomes  Education material provided: no new handouts today  If problems or questions, patient to contact team via:  Phone and Email  Future DSME appointment: - 2 months

## 2015-08-14 NOTE — Patient Instructions (Signed)
Plan:  Aim for 2 Carb Choices per meal (30 grams) +/- 1 either way  Aim for 0-1 Carbs per snack if hungry  Include protein in moderation with your meals and snacks Continue reading food labels for Total Carbohydrate of foods Continue your activity level daily including walking and Arm Chair Exercises Continue additional exercises after dinner to assist in lowering FBG. Continue checking BG at alternate times per day, twice a week as directed by MD  Continue taking medication as directed by MD

## 2015-08-28 DIAGNOSIS — E669 Obesity, unspecified: Secondary | ICD-10-CM | POA: Diagnosis not present

## 2015-08-28 DIAGNOSIS — Z79899 Other long term (current) drug therapy: Secondary | ICD-10-CM | POA: Diagnosis not present

## 2015-08-28 DIAGNOSIS — Z940 Kidney transplant status: Secondary | ICD-10-CM | POA: Diagnosis not present

## 2015-08-28 DIAGNOSIS — I129 Hypertensive chronic kidney disease with stage 1 through stage 4 chronic kidney disease, or unspecified chronic kidney disease: Secondary | ICD-10-CM | POA: Diagnosis not present

## 2015-10-01 DIAGNOSIS — J069 Acute upper respiratory infection, unspecified: Secondary | ICD-10-CM | POA: Diagnosis not present

## 2015-10-13 DIAGNOSIS — Z79899 Other long term (current) drug therapy: Secondary | ICD-10-CM | POA: Diagnosis not present

## 2015-10-13 DIAGNOSIS — E139 Other specified diabetes mellitus without complications: Secondary | ICD-10-CM | POA: Diagnosis not present

## 2015-10-13 DIAGNOSIS — Z940 Kidney transplant status: Secondary | ICD-10-CM | POA: Diagnosis not present

## 2015-10-13 DIAGNOSIS — I10 Essential (primary) hypertension: Secondary | ICD-10-CM | POA: Diagnosis not present

## 2015-10-13 DIAGNOSIS — N183 Chronic kidney disease, stage 3 (moderate): Secondary | ICD-10-CM | POA: Diagnosis not present

## 2015-10-14 DIAGNOSIS — Z48298 Encounter for aftercare following other organ transplant: Secondary | ICD-10-CM | POA: Diagnosis not present

## 2015-10-14 DIAGNOSIS — Z940 Kidney transplant status: Secondary | ICD-10-CM | POA: Diagnosis not present

## 2015-10-16 ENCOUNTER — Ambulatory Visit: Payer: BLUE CROSS/BLUE SHIELD | Admitting: *Deleted

## 2015-10-26 DIAGNOSIS — E7800 Pure hypercholesterolemia, unspecified: Secondary | ICD-10-CM | POA: Diagnosis not present

## 2015-10-26 DIAGNOSIS — E1165 Type 2 diabetes mellitus with hyperglycemia: Secondary | ICD-10-CM | POA: Diagnosis not present

## 2015-10-26 DIAGNOSIS — I10 Essential (primary) hypertension: Secondary | ICD-10-CM | POA: Diagnosis not present

## 2015-10-27 DIAGNOSIS — E1165 Type 2 diabetes mellitus with hyperglycemia: Secondary | ICD-10-CM | POA: Diagnosis not present

## 2015-10-27 DIAGNOSIS — E7800 Pure hypercholesterolemia, unspecified: Secondary | ICD-10-CM | POA: Diagnosis not present

## 2015-10-27 DIAGNOSIS — I10 Essential (primary) hypertension: Secondary | ICD-10-CM | POA: Diagnosis not present

## 2015-11-03 DIAGNOSIS — E1165 Type 2 diabetes mellitus with hyperglycemia: Secondary | ICD-10-CM | POA: Diagnosis not present

## 2015-11-18 DIAGNOSIS — Z940 Kidney transplant status: Secondary | ICD-10-CM | POA: Diagnosis not present

## 2015-11-18 DIAGNOSIS — Z48298 Encounter for aftercare following other organ transplant: Secondary | ICD-10-CM | POA: Diagnosis not present

## 2015-11-25 DIAGNOSIS — I129 Hypertensive chronic kidney disease with stage 1 through stage 4 chronic kidney disease, or unspecified chronic kidney disease: Secondary | ICD-10-CM | POA: Diagnosis not present

## 2015-11-25 DIAGNOSIS — R739 Hyperglycemia, unspecified: Secondary | ICD-10-CM | POA: Diagnosis not present

## 2015-11-25 DIAGNOSIS — Z940 Kidney transplant status: Secondary | ICD-10-CM | POA: Diagnosis not present

## 2015-11-25 DIAGNOSIS — Z79899 Other long term (current) drug therapy: Secondary | ICD-10-CM | POA: Diagnosis not present

## 2015-11-25 DIAGNOSIS — Z23 Encounter for immunization: Secondary | ICD-10-CM | POA: Diagnosis not present

## 2017-01-24 DIAGNOSIS — E785 Hyperlipidemia, unspecified: Secondary | ICD-10-CM | POA: Diagnosis not present

## 2017-01-24 DIAGNOSIS — Z940 Kidney transplant status: Secondary | ICD-10-CM | POA: Diagnosis not present

## 2017-01-24 DIAGNOSIS — R739 Hyperglycemia, unspecified: Secondary | ICD-10-CM | POA: Diagnosis not present

## 2017-01-24 DIAGNOSIS — M109 Gout, unspecified: Secondary | ICD-10-CM | POA: Diagnosis not present

## 2017-01-24 DIAGNOSIS — I129 Hypertensive chronic kidney disease with stage 1 through stage 4 chronic kidney disease, or unspecified chronic kidney disease: Secondary | ICD-10-CM | POA: Diagnosis not present

## 2017-01-25 DIAGNOSIS — I129 Hypertensive chronic kidney disease with stage 1 through stage 4 chronic kidney disease, or unspecified chronic kidney disease: Secondary | ICD-10-CM | POA: Diagnosis not present

## 2017-01-25 DIAGNOSIS — Z79899 Other long term (current) drug therapy: Secondary | ICD-10-CM | POA: Diagnosis not present

## 2017-01-25 DIAGNOSIS — Z940 Kidney transplant status: Secondary | ICD-10-CM | POA: Diagnosis not present

## 2017-01-25 DIAGNOSIS — E669 Obesity, unspecified: Secondary | ICD-10-CM | POA: Diagnosis not present

## 2017-01-31 DIAGNOSIS — L739 Follicular disorder, unspecified: Secondary | ICD-10-CM | POA: Diagnosis not present

## 2017-03-15 DIAGNOSIS — M109 Gout, unspecified: Secondary | ICD-10-CM | POA: Diagnosis not present

## 2017-03-15 DIAGNOSIS — I129 Hypertensive chronic kidney disease with stage 1 through stage 4 chronic kidney disease, or unspecified chronic kidney disease: Secondary | ICD-10-CM | POA: Diagnosis not present

## 2017-03-15 DIAGNOSIS — Z940 Kidney transplant status: Secondary | ICD-10-CM | POA: Diagnosis not present

## 2017-03-28 ENCOUNTER — Encounter (INDEPENDENT_AMBULATORY_CARE_PROVIDER_SITE_OTHER): Payer: Self-pay

## 2017-04-13 DIAGNOSIS — Z87440 Personal history of urinary (tract) infections: Secondary | ICD-10-CM | POA: Diagnosis not present

## 2017-04-19 ENCOUNTER — Encounter (INDEPENDENT_AMBULATORY_CARE_PROVIDER_SITE_OTHER): Payer: Self-pay | Admitting: Family Medicine

## 2017-04-19 ENCOUNTER — Ambulatory Visit (INDEPENDENT_AMBULATORY_CARE_PROVIDER_SITE_OTHER): Payer: BLUE CROSS/BLUE SHIELD | Admitting: Family Medicine

## 2017-04-19 VITALS — BP 136/84 | HR 75 | Temp 97.4°F | Ht 65.0 in | Wt 283.0 lb

## 2017-04-19 DIAGNOSIS — Q613 Polycystic kidney, unspecified: Secondary | ICD-10-CM

## 2017-04-19 DIAGNOSIS — R0602 Shortness of breath: Secondary | ICD-10-CM | POA: Diagnosis not present

## 2017-04-19 DIAGNOSIS — Z9189 Other specified personal risk factors, not elsewhere classified: Secondary | ICD-10-CM

## 2017-04-19 DIAGNOSIS — Z6841 Body Mass Index (BMI) 40.0 and over, adult: Secondary | ICD-10-CM

## 2017-04-19 DIAGNOSIS — Z1331 Encounter for screening for depression: Secondary | ICD-10-CM | POA: Diagnosis not present

## 2017-04-19 DIAGNOSIS — Z794 Long term (current) use of insulin: Secondary | ICD-10-CM

## 2017-04-19 DIAGNOSIS — E119 Type 2 diabetes mellitus without complications: Secondary | ICD-10-CM | POA: Diagnosis not present

## 2017-04-19 DIAGNOSIS — R5383 Other fatigue: Secondary | ICD-10-CM

## 2017-04-19 DIAGNOSIS — Z0289 Encounter for other administrative examinations: Secondary | ICD-10-CM

## 2017-04-20 LAB — COMPREHENSIVE METABOLIC PANEL
ALT: 20 IU/L (ref 0–32)
AST: 13 IU/L (ref 0–40)
Albumin/Globulin Ratio: 2.1 (ref 1.2–2.2)
Albumin: 4.4 g/dL (ref 3.5–5.5)
Alkaline Phosphatase: 77 IU/L (ref 39–117)
BUN/Creatinine Ratio: 14 (ref 9–23)
BUN: 14 mg/dL (ref 6–24)
Bilirubin Total: 0.6 mg/dL (ref 0.0–1.2)
CALCIUM: 9.2 mg/dL (ref 8.7–10.2)
CO2: 20 mmol/L (ref 20–29)
CREATININE: 1.01 mg/dL — AB (ref 0.57–1.00)
Chloride: 106 mmol/L (ref 96–106)
GFR, EST AFRICAN AMERICAN: 74 mL/min/{1.73_m2} (ref >59)
GFR, EST NON AFRICAN AMERICAN: 65 mL/min/{1.73_m2} (ref >59)
GLOBULIN, TOTAL: 2.1 g/dL (ref 1.5–4.5)
Glucose: 140 mg/dL — ABNORMAL HIGH (ref 65–99)
POTASSIUM: 4.4 mmol/L (ref 3.5–5.2)
Sodium: 142 mmol/L (ref 134–144)
TOTAL PROTEIN: 6.5 g/dL (ref 6.0–8.5)

## 2017-04-20 LAB — T3: T3, Total: 150 ng/dL (ref 71–180)

## 2017-04-20 LAB — MICROALBUMIN / CREATININE URINE RATIO
Creatinine, Urine: 63.4 mg/dL
Microalbumin, Urine: <3 ug/mL

## 2017-04-20 LAB — CBC WITH DIFFERENTIAL
BASOS: 1 %
Basophils Absolute: 0 10*3/uL (ref 0.0–0.2)
EOS (ABSOLUTE): 0.2 10*3/uL (ref 0.0–0.4)
EOS: 3 %
HEMATOCRIT: 39.9 % (ref 34.0–46.6)
HEMOGLOBIN: 13 g/dL (ref 11.1–15.9)
IMMATURE GRANS (ABS): 0 10*3/uL (ref 0.0–0.1)
Immature Granulocytes: 0 %
LYMPHS: 16 %
Lymphocytes Absolute: 1 10*3/uL (ref 0.7–3.1)
MCH: 28.4 pg (ref 26.6–33.0)
MCHC: 32.6 g/dL (ref 31.5–35.7)
MCV: 87 fL (ref 79–97)
MONOCYTES: 8 %
Monocytes Absolute: 0.5 10*3/uL (ref 0.1–0.9)
Neutrophils Absolute: 4.7 10*3/uL (ref 1.4–7.0)
Neutrophils: 72 %
RBC: 4.58 x10E6/uL (ref 3.77–5.28)
RDW: 14.1 % (ref 12.3–15.4)
WBC: 6.4 10*3/uL (ref 3.4–10.8)

## 2017-04-20 LAB — LIPID PANEL WITH LDL/HDL RATIO
CHOLESTEROL TOTAL: 115 mg/dL (ref 100–199)
HDL: 46 mg/dL (ref >39)
LDL Calculated: 56 mg/dL (ref 0–99)
LDL/HDL RATIO: 1.2 ratio (ref 0.0–3.2)
TRIGLYCERIDES: 63 mg/dL (ref 0–149)
VLDL Cholesterol Cal: 13 mg/dL (ref 5–40)

## 2017-04-20 LAB — VITAMIN B12: Vitamin B-12: 627 pg/mL (ref 232–1245)

## 2017-04-20 LAB — TSH: TSH: 2.08 u[IU]/mL (ref 0.450–4.500)

## 2017-04-20 LAB — FOLATE: Folate: 9 ng/mL (ref >3.0)

## 2017-04-20 LAB — HEMOGLOBIN A1C
ESTIMATED AVERAGE GLUCOSE: 174 mg/dL
Hgb A1c MFr Bld: 7.7 % — ABNORMAL HIGH (ref 4.8–5.6)

## 2017-04-20 LAB — INSULIN, RANDOM: INSULIN: 26.1 u[IU]/mL — ABNORMAL HIGH (ref 2.6–24.9)

## 2017-04-20 LAB — T4, FREE: Free T4: 1.11 ng/dL (ref 0.82–1.77)

## 2017-04-20 LAB — VITAMIN D 25 HYDROXY (VIT D DEFICIENCY, FRACTURES): VIT D 25 HYDROXY: 14.1 ng/mL — AB (ref 30.0–100.0)

## 2017-04-20 NOTE — Progress Notes (Signed)
.  Office: [phone]  /  Fax: [phone]   HPI:   Chief Complaint: OBESITY  [name] [name] (MR# [id number]) is a 52 y.o. female who presents on 04/20/2017 for obesity evaluation and treatment. Current BMI is Body mass index is 47.09 kg/m.[name] [name] has struggled with obesity for years and has been unsuccessful in either losing weight or maintaining long term weight loss. [name] [name] attended our information session and states she is currently in the action stage of change and ready to dedicate time achieving and maintaining a healthier weight. Her husband is also starting the program. Kanchan states her family eats meals together she thinks her family will eat healthier with  her she struggles with family and or coworkers weight loss sabotage her desired weight loss is 144 lbs she has been heavy most of  her life she started gaining weight at age 71 her heaviest weight ever was 284 lbs. she is a picky eater and doesn't like to eat healthier foods  she has significant food cravings issues  she snacks frequently in the evenings she skips meals frequently she is frequently drinking liquids with calories she struggles with emotional eating    Fatigue [name] [name] feels her energy is lower than it should be. This has worsened with weight gain and has not worsened recently. [name] [name] admits to daytime somnolence and  admits to waking up still tired. Patient is at risk for obstructive sleep apnea. Patent has a history of symptoms of daytime fatigue and morning fatigue. Patient generally gets 5 to 7 hours of sleep per night, and states they generally have restful sleep. Snoring is present. Apneic episodes are not present. Epworth Sleepiness Score is 4  Dyspnea on exertion [name] [name] notes increasing shortness of breath with exercising and seems to be worsening over time with weight gain. She notes getting out of breath sooner with activity than she used to. This has not gotten worse recently. [name] [name] denies  orthopnea.  Polycystic Kidney Disease [name] [name] is status post transplant and is no longer on dialysis. She is followed by nephrology. [name] [name] states she is doing well.  Diabetes II [name] [name] has a diagnosis of diabetes type II. Macayla is only checking her blood sugar one time per week, due to needle phobia. She notes feeling hypoglycemic at times, but she doesn't check her glucose levels. She has completely unknown control.She is attempting to work on intensive lifestyle modifications including diet, exercise, and weight loss to help control her blood glucose levels.  At risk for cardiovascular disease [name] [name] is at a higher than average risk for cardiovascular disease due to obesity and diabetes. She currently denies any chest pain.  Depression Screen [name] [name] Food and Mood (modified PHQ-9) score was  Depression screen PHQ 2/9 04/19/2017  Decreased Interest 2  Down, Depressed, Hopeless 1  PHQ - 2 Score 3  Altered sleeping 1  Tired, decreased energy 2  Change in appetite 1  Feeling bad or failure about yourself  1  Trouble concentrating 1  Moving slowly or fidgety/restless 0  Suicidal thoughts 0  PHQ-9 Score 9  Difficult doing work/chores Somewhat difficult    ALLERGIES: Allergies  Allergen Reactions  . Tape   . Vancomycin     MEDICATIONS: Current Outpatient Medications on File Prior to Visit  Medication Sig Dispense Refill  . aspirin 325 MG tablet Take 325 mg by mouth daily.    [name] [name] Kitchen atorvastatin (LIPITOR) 40 MG tablet Take 40 mg by mouth at bedtime.     [name] [name] Kitchen glimepiride (AMARYL)  4 MG tablet Take 4 mg by mouth daily with breakfast.    . Insulin Glargine (BASAGLAR KWIKPEN [place]) Inject 50 Units into the skin at bedtime.    [name] [name] Kitchen linagliptin (TRADJENTA) 5 MG TABS tablet Take 5 mg by mouth daily.    . mycophenolate (MYFORTIC) 360 MG TBEC EC tablet Take 720 mg by mouth 2 (two) times daily.    [name] [name] Kitchen PROGRAF 0.5 MG capsule Take 0.5 mg by mouth at bedtime. Take along with 1 mg tablet at qhs=1.5 mg  5  .  tacrolimus (PROGRAF) 1 MG capsule Take 1-2 mg by mouth 2 (two) times daily. Take 2 tabs (2 mg) in the am and Take 1 tab (1 mg) along with 0.5 mg tab=1.5 mg at qhs.    . cephALEXin (KEFLEX) 250 MG capsule Take 1 capsule (250 mg total) by mouth 4 (four) times daily. (Patient not taking: Reported on 02/10/2015) 28 capsule 0  . potassium chloride SA (K-DUR,KLOR-CON) 20 MEQ tablet Take 2 tablets (40 mEq total) by mouth daily. 60 tablet 0   No current facility-administered medications on file prior to visit.     PAST MEDICAL HISTORY: Past Medical History:  Diagnosis Date  . Chronic kidney disease   . Diabetes mellitus without complication ([place])   . Eczema   . Enlarged kidney    bilateral  . Gout    previously  . Hyperlipidemia   . Hypertension   . Kidney transplanted   . Polycystic kidney disease   . Psoriasis     PAST SURGICAL HISTORY: Past Surgical History:  Procedure Laterality Date  . ABDOMINAL HYSTERECTOMY  2000?   complete  . CATARACT EXTRACTION  2011   bilateral  . [place]  . HERNIA REPAIR  1610   umbilical hernia  . HERNIA REPAIR  2013  . NEPHRECTOMY TRANSPLANTED ORGAN    . PERITONEAL CATHETER INSERTION    . VENTRAL HERNIA REPAIR  07/04/2011   Procedure: HERNIA REPAIR VENTRAL ADULT;  Surgeon: Imogene Burn. Georgette Dover, MD;  Location: [facility];  Service: General;  Laterality: N/A;    SOCIAL HISTORY: Social History   Tobacco Use  . Smoking status: Never Smoker  . Smokeless tobacco: Never Used  Substance Use Topics  . Alcohol use: No  . Drug use: No    FAMILY HISTORY: Family History  Problem Relation Age of Onset  . Hypertension Father   . Polycystic kidney disease Father        died before starting dialysis was near ESRD  . Cerebral aneurysm Father        died age 66  . Stroke Father   . Cancer Sister 6       cancer that developed in the fatty tissues of the muscle  . Hypertension Brother   . Polycystic kidney disease Brother         transplant  7 years ago    ROS: Review of Systems  Constitutional: Positive for malaise/fatigue.  HENT:       Dry Mouth  Eyes:       Wear Glasses or Contacts  Respiratory: Positive for shortness of breath (on exertion).   Cardiovascular: Negative for chest pain and orthopnea.  Gastrointestinal: Positive for heartburn.  Skin:       Dryness  Neurological: Positive for headaches.  Endo/Heme/Allergies: Bruises/bleeds easily (bruising).       Positive for hypoglycemia    PHYSICAL EXAM: Blood pressure 136/84, pulse 75, temperature (!) 97.4 F (36.3 C),  temperature source Oral, height 5\' 5"  (1.651 m), weight 283 lb (128.4 kg), SpO2 96 %. Body mass index is 47.09 kg/m. Physical Exam  Constitutional: She is oriented to person, place, and time. She appears well-developed and well-nourished.  HENT:  Head: Normocephalic and atraumatic.  Nose: Nose normal.  Eyes: EOM are normal. No scleral icterus.  Neck: Normal range of motion. Neck supple. No thyromegaly present.  Cardiovascular: Normal rate.  Pulmonary/Chest: Effort normal. No respiratory distress.  Abdominal: Soft. There is no tenderness.  + obesity  Musculoskeletal: Normal range of motion.  Range of Motion normal in all 4 extremities  Neurological: She is oriented to person, place, and time.  Skin: Skin is warm and dry.  Psychiatric: She has a normal mood and affect. Her behavior is normal.  Vitals reviewed.   RECENT LABS AND TESTS: BMET    Component Value Date/Time   NA 142 04/19/2017 0934   K 4.4 04/19/2017 0934   CL 106 04/19/2017 0934   CO2 20 04/19/2017 0934   GLUCOSE 140 (H) 04/19/2017 0934   GLUCOSE 122 (H) 02/10/2015 1625   BUN 14 04/19/2017 0934   CREATININE 1.01 (H) 04/19/2017 0934   CALCIUM 9.2 04/19/2017 0934   GFRNONAA 65 04/19/2017 0934   GFRAA 74 04/19/2017 0934   Lab Results  Component Value Date   HGBA1C 7.7 (H) 04/19/2017   Lab Results  Component Value Date   INSULIN 26.1 (H) 04/19/2017    CBC    Component Value Date/Time   WBC 6.4 04/19/2017 0934   WBC 7.7 02/10/2015 1625   RBC 4.58 04/19/2017 0934   RBC 4.50 02/10/2015 1625   HGB 13.0 04/19/2017 0934   HCT 39.9 04/19/2017 0934   PLT 217 02/10/2015 1625   MCV 87 04/19/2017 0934   MCH 28.4 04/19/2017 0934   MCH 28.2 02/10/2015 1625   MCHC 32.6 04/19/2017 0934   MCHC 32.9 02/10/2015 1625   RDW 14.1 04/19/2017 0934   LYMPHSABS 1.0 04/19/2017 0934   MONOABS 0.6 07/03/2011 1807   EOSABS 0.2 04/19/2017 0934   BASOSABS 0.0 04/19/2017 0934   Iron/TIBC/Ferritin/ %Sat No results found for: IRON, TIBC, FERRITIN, IRONPCTSAT Lipid Panel     Component Value Date/Time   CHOL 115 04/19/2017 0934   TRIG 63 04/19/2017 0934   HDL 46 04/19/2017 0934   LDLCALC 56 04/19/2017 0934   Hepatic Function Panel     Component Value Date/Time   PROT 6.5 04/19/2017 0934   ALBUMIN 4.4 04/19/2017 0934   AST 13 04/19/2017 0934   ALT 20 04/19/2017 0934   ALKPHOS 77 04/19/2017 0934   BILITOT 0.6 04/19/2017 0934   BILIDIR 0.2 02/10/2015 1625   IBILI 0.5 02/10/2015 1625      Component Value Date/Time   TSH 2.080 04/19/2017 0934   Vitamin D There are no recent results  ECG  shows NSR with a rate of 71 BPM INDIRECT CALORIMETER done today shows a VO2 of 223 and a REE of 1549. Her calculated basal metabolic rate is 1610 thus her basal metabolic rate is worse than expected.    ASSESSMENT AND PLAN: Other fatigue - Plan: EKG 12-Lead, Vitamin B12, CBC With Differential, Folate, Lipid Panel With LDL/HDL Ratio, T3, T4, free, TSH, VITAMIN D 25 Hydroxy (Vit-D Deficiency, Fractures)  Shortness of breath on exertion - Plan: CBC With Differential  Polycystic kidney disease - Plan: Comprehensive metabolic panel  Type 2 diabetes mellitus without complication, with long-term current use of insulin ([place]) - Plan: Comprehensive  metabolic panel, Hemoglobin A1c, Insulin, random, Microalbumin / creatinine urine ratio  Depression screening  At  risk for heart disease  Class 3 severe obesity with serious comorbidity and body mass index (BMI) of 45.0 to 49.9 in adult, unspecified obesity type ([place])  PLAN:  Fatigue [name] [name] was informed that her fatigue may be related to obesity, depression or many other causes. Labs will be ordered, and in the meanwhile [name] [name] has agreed to work on diet, exercise and weight loss to help with fatigue. Proper sleep hygiene was discussed including the need for 7-8 hours of quality sleep each night. A sleep study was not ordered based on symptoms and Epworth score.  Dyspnea on exertion [name] [name]'s shortness of breath appears to be obesity related and exercise induced. She has agreed to work on weight loss and gradually increase exercise to treat her exercise induced shortness of breath. If Alayzia follows our instructions and loses weight without improvement of her shortness of breath, we will plan to refer to pulmonology. We will monitor this condition regularly. [name] [name] agrees to this plan.  Polycystic Kidney Disease [name] [name] is on an adequate, but not high protein diet. We will check labs and follow.  Diabetes II [name] [name] has been given extensive diabetes education by myself today including ideal fasting and post-prandial blood glucose readings, individual ideal Hgb A1c goals and hypoglycemia prevention. We discussed the importance of good blood sugar control to decrease the likelihood of diabetic complications such as nephropathy, neuropathy, limb loss, blindness, coronary artery disease, and death. We discussed the importance of intensive lifestyle modification including diet, exercise and weight loss as the first line treatment for diabetes. [name] [name] agrees to check her blood sugar 2 times daily and she can use OTC topical lidocaine to help numb for 5 minutes before she checks her glucose levels, to help with her phobia.Her husband agreed to help her with this. [name] [name] agrees to continue her diabetes medications as prescribed  for now and follow up in 2 weeks.  Cardiovascular risk counseling [name] [name] was given extended (15 minutes) coronary artery disease prevention counseling today. She is 52 y.o. female and has risk factors for heart disease including obesity and diabetes. We discussed intensive lifestyle modifications today with an emphasis on specific weight loss instructions and strategies. Pt was also informed of the importance of increasing exercise and decreasing saturated fats to help prevent heart disease.  Depression Screen [name] [name] had a mildly positive depression screening. Depression is commonly associated with obesity and often results in emotional eating behaviors. We will monitor this closely and work on CBT to help improve the non-hunger eating patterns. Referral to Psychology may be required if no improvement is seen as she continues in our clinic.  Obesity [name] [name] is currently in the action stage of change and her goal is to continue with weight loss efforts She has agreed to follow the Category 2 plan Jianna has been instructed to work up to a goal of 150 minutes of combined cardio and strengthening exercise per week for weight loss and overall health benefits. We discussed the following Behavioral Modification Strategies today: increase H2O intake, decreasing simple carbohydrates  and work on meal planning and easy cooking plans  Penelope has agreed to follow up with our clinic in 2 weeks. She was informed of the importance of frequent follow up visits to maximize her success with intensive lifestyle modifications for her multiple health conditions. She was informed we would discuss her lab results at her next visit unless there is a  critical issue that needs to be addressed sooner. Samiha agreed to keep her next visit at the agreed upon time to discuss these results.    OBESITY BEHAVIORAL INTERVENTION VISIT  Today's visit was # 1 out of 22.  Starting weight: 283 lbs Starting date: 04/19/17 Today's weight  : 283 lbs Today's date: 04/19/2017 Total lbs lost to date: 0 (Patients must lose 7 lbs in the first 6 months to continue with counseling)   ASK: We discussed the diagnosis of obesity with [name] today and [name] [name] agreed to give [place] permission to discuss obesity behavioral modification therapy today.  ASSESS: Sarahgrace has the diagnosis of obesity and her BMI today is 14.09 Luverta is in the action stage of change   ADVISE: Mischa was educated on the multiple health risks of obesity as well as the benefit of weight loss to improve her health. She was advised of the need for long term treatment and the importance of lifestyle modifications.  AGREE: Multiple dietary modification options and treatment options were discussed and  Cheryln agreed to the above obesity treatment plan.   I, Doreene Nest, am acting as transcriptionist for Dennard Nip, MD   I have reviewed the above documentation for accuracy and completeness, and I agree with the above. -Dennard Nip, MD

## 2017-04-24 DIAGNOSIS — E7800 Pure hypercholesterolemia, unspecified: Secondary | ICD-10-CM | POA: Diagnosis not present

## 2017-04-24 DIAGNOSIS — I10 Essential (primary) hypertension: Secondary | ICD-10-CM | POA: Diagnosis not present

## 2017-04-24 DIAGNOSIS — E1165 Type 2 diabetes mellitus with hyperglycemia: Secondary | ICD-10-CM | POA: Diagnosis not present

## 2017-05-03 ENCOUNTER — Ambulatory Visit (INDEPENDENT_AMBULATORY_CARE_PROVIDER_SITE_OTHER): Payer: BLUE CROSS/BLUE SHIELD | Admitting: Family Medicine

## 2017-05-03 VITALS — BP 135/78 | HR 66 | Temp 98.2°F | Ht 65.0 in | Wt 278.0 lb

## 2017-05-03 DIAGNOSIS — Z794 Long term (current) use of insulin: Secondary | ICD-10-CM

## 2017-05-03 DIAGNOSIS — E119 Type 2 diabetes mellitus without complications: Secondary | ICD-10-CM | POA: Diagnosis not present

## 2017-05-03 DIAGNOSIS — Z6841 Body Mass Index (BMI) 40.0 and over, adult: Secondary | ICD-10-CM

## 2017-05-03 DIAGNOSIS — Z9189 Other specified personal risk factors, not elsewhere classified: Secondary | ICD-10-CM

## 2017-05-03 DIAGNOSIS — Z940 Kidney transplant status: Secondary | ICD-10-CM | POA: Diagnosis not present

## 2017-05-03 DIAGNOSIS — E559 Vitamin D deficiency, unspecified: Secondary | ICD-10-CM

## 2017-05-03 MED ORDER — VITAMIN D (ERGOCALCIFEROL) 1.25 MG (50000 UNIT) PO CAPS: 50000.0000 [IU] | ORAL_CAPSULE | ORAL | 0 refills | Status: DC

## 2017-05-08 NOTE — Progress Notes (Signed)
Office: [phone]  /  Fax: [phone]   HPI:   Chief Complaint: OBESITY [name] [name] is here to discuss her progress with her obesity treatment plan. She is on the Category 2 plan and is following her eating plan approximately 75 % of the time. She states she is exercising 0 minutes 0 times per week. [name] did well with weight loss on Category 2 plan but felt sick after breakfast. She did well trying to eat most of her food. She went out of town but tried to make better choices with eating out.  Her weight is 278 lb (126.1 kg) today and has had a weight loss of 5 pounds over a period of 2 weeks since her last visit. She has lost 5 lbs since starting treatment with [place].  Vitamin D Deficiency [name] [name] has a new diagnosis of vitamin D deficiency. She is not Vit D, she notes fatigue and denies nausea, vomiting or muscle weakness.  Diabetes II [name] [name] has a diagnosis of diabetes type II. [name] is on Tradjenta, glimepiride, and Engineer, agricultural. She has stated she checks some glucose levels and fasting glucose ranges between 120 and 140, 2 hour post prandials ranges between 90 and 125. She denies any hypoglycemic episodes. Last A1c was 7.7 on 04/19/17. She has been working on intensive lifestyle modifications including diet, exercise, and weight loss to help control her blood glucose levels.  At risk for cardiovascular disease [name] [name] is at a higher than average risk for cardiovascular disease due to obesity and diabetes II. She currently denies any chest pain.  Status Post Renal Transplant [name] [name] is on Prograf, renal function improved GFR >60 and Urine:MAB ratio less than 30.  ALLERGIES: Allergies  Allergen Reactions  . Tape   . Vancomycin     MEDICATIONS: Current Outpatient Medications on File Prior to Visit  Medication Sig Dispense Refill  . Insulin Degludec (TRESIBA) 100 UNIT/ML SOLN Inject 50 Units into the skin once.    Marland Kitchen aspirin 325 MG tablet Take 325 mg by mouth daily.    Marland Kitchen atorvastatin (LIPITOR)  40 MG tablet Take 40 mg by mouth at bedtime.     Marland Kitchen glimepiride (AMARYL) 4 MG tablet Take 4 mg by mouth daily with breakfast.    . linagliptin (TRADJENTA) 5 MG TABS tablet Take 5 mg by mouth daily.    . mycophenolate (MYFORTIC) 360 MG TBEC EC tablet Take 720 mg by mouth 2 (two) times daily.    Marland Kitchen PROGRAF 0.5 MG capsule Take 0.5 mg by mouth at bedtime. Take along with 1 mg tablet at qhs=1.5 mg  5  . tacrolimus (PROGRAF) 1 MG capsule Take 1-2 mg by mouth 2 (two) times daily. Take 2 tabs (2 mg) in the am and Take 1 tab (1 mg) along with 0.5 mg tab=1.5 mg at qhs.     No current facility-administered medications on file prior to visit.     PAST MEDICAL HISTORY: Past Medical History:  Diagnosis Date  . Chronic kidney disease   . Diabetes mellitus without complication ([place])   . Eczema   . Enlarged kidney    bilateral  . Gout    previously  . Hyperlipidemia   . Hypertension   . Kidney transplanted   . Polycystic kidney disease   . Psoriasis     PAST SURGICAL HISTORY: Past Surgical History:  Procedure Laterality Date  . ABDOMINAL HYSTERECTOMY  2000?   complete  . CATARACT EXTRACTION  2011   bilateral  . CESAREAN SECTION  1995 and 1997  . HERNIA REPAIR  8563   umbilical hernia  . HERNIA REPAIR  2013  . NEPHRECTOMY TRANSPLANTED ORGAN    . PERITONEAL CATHETER INSERTION    . VENTRAL HERNIA REPAIR  07/04/2011   Procedure: HERNIA REPAIR VENTRAL ADULT;  Surgeon: Imogene Burn. [name] Dover, MD;  Location: [facility];  Service: General;  Laterality: N/A;    SOCIAL HISTORY: Social History   Tobacco Use  . Smoking status: Never Smoker  . Smokeless tobacco: Never Used  Substance Use Topics  . Alcohol use: No  . Drug use: No    FAMILY HISTORY: Family History  Problem Relation Age of Onset  . Hypertension Father   . Polycystic kidney disease Father        died before starting dialysis was near ESRD  . Cerebral aneurysm Father        died age 58  . Stroke Father   . Cancer Sister 41        cancer that developed in the fatty tissues of the muscle  . Hypertension Brother   . Polycystic kidney disease Brother        transplant  7 years ago    ROS: Review of Systems  Constitutional: Positive for malaise/fatigue and weight loss.  Cardiovascular: Negative for chest pain.  Gastrointestinal: Negative for nausea and vomiting.  Musculoskeletal:       Negative muscle weakness  Endo/Heme/Allergies:       Negative hypoglycemia    PHYSICAL EXAM: Blood pressure 135/78, pulse 66, temperature 98.2 F (36.8 C), height 5\' 5"  (1.651 m), weight 278 lb (126.1 kg), SpO2 95 %. Body mass index is 46.26 kg/m. Physical Exam  Constitutional: She is oriented to person, place, and time. She appears well-developed and well-nourished.  Cardiovascular: Normal rate.  Pulmonary/Chest: Effort normal.  Musculoskeletal: Normal range of motion.  Neurological: She is oriented to person, place, and time.  Skin: Skin is warm and dry.  Psychiatric: She has a normal mood and affect. Her behavior is normal.  Vitals reviewed.   RECENT LABS AND TESTS: BMET    Component Value Date/Time   NA 142 04/19/2017 0934   K 4.4 04/19/2017 0934   CL 106 04/19/2017 0934   CO2 20 04/19/2017 0934   GLUCOSE 140 (H) 04/19/2017 0934   GLUCOSE 122 (H) 02/10/2015 1625   BUN 14 04/19/2017 0934   CREATININE 1.01 (H) 04/19/2017 0934   CALCIUM 9.2 04/19/2017 0934   GFRNONAA 65 04/19/2017 0934   GFRAA 74 04/19/2017 0934   Lab Results  Component Value Date   HGBA1C 7.7 (H) 04/19/2017   Lab Results  Component Value Date   INSULIN 26.1 (H) 04/19/2017   CBC    Component Value Date/Time   WBC 6.4 04/19/2017 0934   WBC 7.7 02/10/2015 1625   RBC 4.58 04/19/2017 0934   RBC 4.50 02/10/2015 1625   HGB 13.0 04/19/2017 0934   HCT 39.9 04/19/2017 0934   PLT 217 02/10/2015 1625   MCV 87 04/19/2017 0934   MCH 28.4 04/19/2017 0934   MCH 28.2 02/10/2015 1625   MCHC 32.6 04/19/2017 0934   MCHC 32.9 02/10/2015 1625    RDW 14.1 04/19/2017 0934   LYMPHSABS 1.0 04/19/2017 0934   MONOABS 0.6 07/03/2011 1807   EOSABS 0.2 04/19/2017 0934   BASOSABS 0.0 04/19/2017 0934   Iron/TIBC/Ferritin/ %Sat No results found for: IRON, TIBC, FERRITIN, IRONPCTSAT Lipid Panel     Component Value Date/Time   CHOL 115 04/19/2017 0934  TRIG 63 04/19/2017 0934   HDL 46 04/19/2017 0934   LDLCALC 56 04/19/2017 0934   Hepatic Function Panel     Component Value Date/Time   PROT 6.5 04/19/2017 0934   ALBUMIN 4.4 04/19/2017 0934   AST 13 04/19/2017 0934   ALT 20 04/19/2017 0934   ALKPHOS 77 04/19/2017 0934   BILITOT 0.6 04/19/2017 0934   BILIDIR 0.2 02/10/2015 1625   IBILI 0.5 02/10/2015 1625      Component Value Date/Time   TSH 2.080 04/19/2017 0934  Results for MYIESHA, EDGAR (MRN [id number]) as of 05/08/2017 12:43  Ref. Range 04/19/2017 09:34  Vitamin D, 25-Hydroxy Latest Ref Range: 30.0 - 100.0 ng/mL 14.1 (L)    ASSESSMENT AND PLAN: Vitamin D deficiency - Plan: Vitamin D, Ergocalciferol, (DRISDOL) 50000 units CAPS capsule  Type 2 diabetes mellitus without complication, with long-term current use of insulin ([place])  Renal transplant, status post  At risk for heart disease  Class 3 severe obesity with serious comorbidity and body mass index (BMI) of 45.0 to 49.9 in adult, unspecified obesity type ([place])  PLAN:  Vitamin D Deficiency Deija was informed that low vitamin D levels contributes to fatigue and are associated with obesity, breast, and colon cancer. Nell agrees to start prescription Vit D @50 ,000 IU every week #4 with no refills. She will follow up for routine testing of vitamin D, at least 2-3 times per year. She was informed of the risk of over-replacement of vitamin D and agrees to not increase her dose unless she discusses this with [place] first. Brena agrees to follow up with our clinic in 2 weeks.  Diabetes II Deidrea has been given extensive diabetes education by myself today including ideal fasting and  post-prandial blood glucose readings, individual ideal Hgb A1c goals and hypoglycemia prevention. We discussed the importance of good blood sugar control to decrease the likelihood of diabetic complications such as nephropathy, neuropathy, limb loss, blindness, coronary artery disease, and death. We discussed the importance of intensive lifestyle modification including diet, exercise and weight loss as the first line treatment for diabetes. Toi agrees to continue her diabetes medications as is and continue diet and weight loss and we will continue to monitor closely. Claryce agrees to follow up with our clinic in 2 weeks.  Cardiovascular risk counselling Roselynn was given extended (30 minutes) coronary artery disease prevention counseling today. She is 52 y.o. female and has risk factors for heart disease including obesity and diabetes II. We discussed intensive lifestyle modifications today with an emphasis on specific weight loss instructions and strategies. Pt was also informed of the importance of increasing exercise and decreasing saturated fats to help prevent heart disease.  Status Post Renal Transplant Carle is to continue to increase H20 and work on diabetes mellitus control and weight loss to help keep kidneys healthy. Jesly agrees to follow up with our clinic in 2 weeks.  Obesity Chelcee is currently in the action stage of change. As such, her goal is to continue with weight loss efforts She has agreed to follow the Category 2 plan with breakfast options Torry has been instructed to work up to a goal of 150 minutes of combined cardio and strengthening exercise per week for weight loss and overall health benefits. We discussed the following Behavioral Modification Strategies today: increasing lean protein intake, decreasing simple carbohydrates  and work on meal planning and easy cooking plans   Jullianna has agreed to follow up with our clinic in 2 weeks. She was  informed of the importance of  frequent follow up visits to maximize her success with intensive lifestyle modifications for her multiple health conditions.   OBESITY BEHAVIORAL INTERVENTION VISIT  Today's visit was # 2 out of 22.  Starting weight: 283 lbs Starting date: 04/19/17 Today's weight : 278 lbs  Today's date: 05/03/2017 Total lbs lost to date: 5 (Patients must lose 7 lbs in the first 6 months to continue with counseling)   ASK: We discussed the diagnosis of obesity with [name] today and Verdis Frederickson agreed to give [place] permission to discuss obesity behavioral modification therapy today.  ASSESS: Aerica has the diagnosis of obesity and her BMI today is 23.26 Alexina is in the action stage of change   ADVISE: Andrena was educated on the multiple health risks of obesity as well as the benefit of weight loss to improve her health. She was advised of the need for long term treatment and the importance of lifestyle modifications.  AGREE: Multiple dietary modification options and treatment options were discussed and  Paizlie agreed to the above obesity treatment plan.  I, Trixie Dredge, am acting as transcriptionist for Dennard Nip, MD  I have reviewed the above documentation for accuracy and completeness, and I agree with the above. -Dennard Nip, MD

## 2017-05-19 DIAGNOSIS — Z940 Kidney transplant status: Secondary | ICD-10-CM | POA: Diagnosis not present

## 2017-05-19 DIAGNOSIS — D899 Disorder involving the immune mechanism, unspecified: Secondary | ICD-10-CM | POA: Diagnosis not present

## 2017-05-19 DIAGNOSIS — E139 Other specified diabetes mellitus without complications: Secondary | ICD-10-CM | POA: Diagnosis not present

## 2017-05-19 DIAGNOSIS — Z79899 Other long term (current) drug therapy: Secondary | ICD-10-CM | POA: Diagnosis not present

## 2017-05-19 DIAGNOSIS — I10 Essential (primary) hypertension: Secondary | ICD-10-CM | POA: Diagnosis not present

## 2017-05-22 ENCOUNTER — Ambulatory Visit (INDEPENDENT_AMBULATORY_CARE_PROVIDER_SITE_OTHER): Payer: BLUE CROSS/BLUE SHIELD | Admitting: Family Medicine

## 2017-05-22 VITALS — BP 127/81 | HR 66 | Temp 97.9°F | Ht 65.0 in | Wt 277.0 lb

## 2017-05-22 DIAGNOSIS — Z940 Kidney transplant status: Secondary | ICD-10-CM | POA: Diagnosis not present

## 2017-05-22 DIAGNOSIS — E66813 Obesity, class 3: Secondary | ICD-10-CM

## 2017-05-22 DIAGNOSIS — Z6841 Body Mass Index (BMI) 40.0 and over, adult: Secondary | ICD-10-CM

## 2017-05-22 DIAGNOSIS — Z9189 Other specified personal risk factors, not elsewhere classified: Secondary | ICD-10-CM | POA: Diagnosis not present

## 2017-05-22 DIAGNOSIS — E559 Vitamin D deficiency, unspecified: Secondary | ICD-10-CM

## 2017-05-22 MED ORDER — VITAMIN D (ERGOCALCIFEROL) 1.25 MG (50000 UNIT) PO CAPS: 50000.0000 [IU] | ORAL_CAPSULE | ORAL | 0 refills | Status: DC

## 2017-05-23 NOTE — Progress Notes (Signed)
Office: [phone]  /  Fax: [phone]   HPI:   Chief Complaint: OBESITY [name] [name] is here to discuss her progress with her obesity treatment plan. She is on the Category 2 plan with breakfast options and is following her eating plan approximately 85 % of the time. She states she is walking for 15 to 20 minutes 2 times per week. [name] [name] continues to lose weight, but has increased stress leaving her husband's health insurance that runs out today unexpectedly. She is very anxious, trying to figure out what to do next and not able to concentrate on weight loss. Her weight is 277 lb (125.6 kg) today and has had a weight loss of 1 pound over a period of 3 weeks since her last visit. She has lost 6 lbs since starting treatment with [place].  Vitamin D deficiency [name] [name] has a diagnosis of vitamin D deficiency. [name] [name] is stable on vit D but she is not yet at goal. [name] [name] denies nausea, vomiting or muscle weakness.  At risk for osteopenia and osteoporosis [name] [name] is at higher risk of osteopenia and osteoporosis due to vitamin D deficiency.   Status Post Renal Transplant [name] [name] notes increased stress as she is stable on medications, but they are expensive and her health insurance ends tonight.  ALLERGIES: Allergies  Allergen Reactions  . Tape   . Vancomycin     MEDICATIONS: Current Outpatient Medications on File Prior to Visit  Medication Sig Dispense Refill  . aspirin 325 MG tablet Take 325 mg by mouth daily.    Marland Kitchen atorvastatin (LIPITOR) 40 MG tablet Take 40 mg by mouth at bedtime.     Marland Kitchen glimepiride (AMARYL) 4 MG tablet Take 4 mg by mouth daily with breakfast.    . Insulin Degludec (TRESIBA) 100 UNIT/ML SOLN Inject 40 Units into the skin once.     . linagliptin (TRADJENTA) 5 MG TABS tablet Take 5 mg by mouth daily.    . mycophenolate (MYFORTIC) 360 MG TBEC EC tablet Take 720 mg by mouth 2 (two) times daily.    Marland Kitchen PROGRAF 0.5 MG capsule Take 0.5 mg by mouth at bedtime. Take along with 1 mg tablet at  qhs=1.5 mg  5  . tacrolimus (PROGRAF) 1 MG capsule Take 1-2 mg by mouth 2 (two) times daily. Take 2 tabs (2 mg) in the am and Take 1 tab (1 mg) along with 0.5 mg tab=1.5 mg at qhs.     No current facility-administered medications on file prior to visit.     PAST MEDICAL HISTORY: Past Medical History:  Diagnosis Date  . Chronic kidney disease   . Diabetes mellitus without complication ([place])   . Eczema   . Enlarged kidney    bilateral  . Gout    previously  . Hyperlipidemia   . Hypertension   . Kidney transplanted   . Polycystic kidney disease   . Psoriasis     PAST SURGICAL HISTORY: Past Surgical History:  Procedure Laterality Date  . ABDOMINAL HYSTERECTOMY  2000?   complete  . CATARACT EXTRACTION  2011   bilateral  . [place]  . HERNIA REPAIR  7564   umbilical hernia  . HERNIA REPAIR  2013  . NEPHRECTOMY TRANSPLANTED ORGAN    . PERITONEAL CATHETER INSERTION    . VENTRAL HERNIA REPAIR  07/04/2011   Procedure: HERNIA REPAIR VENTRAL ADULT;  Surgeon: Imogene Burn. Georgette Dover, MD;  Location: [place];  Service: General;  Laterality: N/A;    SOCIAL  HISTORY: Social History   Tobacco Use  . Smoking status: Never Smoker  . Smokeless tobacco: Never Used  Substance Use Topics  . Alcohol use: No  . Drug use: No    FAMILY HISTORY: Family History  Problem Relation Age of Onset  . Hypertension Father   . Polycystic kidney disease Father        died before starting dialysis was near ESRD  . Cerebral aneurysm Father        died age 25  . Stroke Father   . Cancer Sister 64       cancer that developed in the fatty tissues of the muscle  . Hypertension Brother   . Polycystic kidney disease Brother        transplant  7 years ago    ROS: Review of Systems  Constitutional: Positive for weight loss.  Gastrointestinal: Negative for nausea and vomiting.  Musculoskeletal:       Negative for muscle weakness  Psychiatric/Behavioral: The patient is  nervous/anxious (anxiousness).        Positive for Stress    PHYSICAL EXAM: Blood pressure 127/81, pulse 66, temperature 97.9 F (36.6 C), temperature source Oral, height 5\' 5"  (1.651 m), weight 277 lb (125.6 kg), SpO2 98 %. Body mass index is 46.1 kg/m. Physical Exam  Constitutional: She is oriented to person, place, and time. She appears well-developed and well-nourished.  Cardiovascular: Normal rate.  Pulmonary/Chest: Effort normal.  Musculoskeletal: Normal range of motion.  Neurological: She is oriented to person, place, and time.  Skin: Skin is warm and dry.  Psychiatric: Her mood appears anxious.  Vitals reviewed.   RECENT LABS AND TESTS: BMET    Component Value Date/Time   NA 142 04/19/2017 0934   K 4.4 04/19/2017 0934   CL 106 04/19/2017 0934   CO2 20 04/19/2017 0934   GLUCOSE 140 (H) 04/19/2017 0934   GLUCOSE 122 (H) 02/10/2015 1625   BUN 14 04/19/2017 0934   CREATININE 1.01 (H) 04/19/2017 0934   CALCIUM 9.2 04/19/2017 0934   GFRNONAA 65 04/19/2017 0934   GFRAA 74 04/19/2017 0934   Lab Results  Component Value Date   HGBA1C 7.7 (H) 04/19/2017   Lab Results  Component Value Date   INSULIN 26.1 (H) 04/19/2017   CBC    Component Value Date/Time   WBC 6.4 04/19/2017 0934   WBC 7.7 02/10/2015 1625   RBC 4.58 04/19/2017 0934   RBC 4.50 02/10/2015 1625   HGB 13.0 04/19/2017 0934   HCT 39.9 04/19/2017 0934   PLT 217 02/10/2015 1625   MCV 87 04/19/2017 0934   MCH 28.4 04/19/2017 0934   MCH 28.2 02/10/2015 1625   MCHC 32.6 04/19/2017 0934   MCHC 32.9 02/10/2015 1625   RDW 14.1 04/19/2017 0934   LYMPHSABS 1.0 04/19/2017 0934   MONOABS 0.6 07/03/2011 1807   EOSABS 0.2 04/19/2017 0934   BASOSABS 0.0 04/19/2017 0934   Iron/TIBC/Ferritin/ %Sat No results found for: IRON, TIBC, FERRITIN, IRONPCTSAT Lipid Panel     Component Value Date/Time   CHOL 115 04/19/2017 0934   TRIG 63 04/19/2017 0934   HDL 46 04/19/2017 0934   LDLCALC 56 04/19/2017 0934    Hepatic Function Panel     Component Value Date/Time   PROT 6.5 04/19/2017 0934   ALBUMIN 4.4 04/19/2017 0934   AST 13 04/19/2017 0934   ALT 20 04/19/2017 0934   ALKPHOS 77 04/19/2017 0934   BILITOT 0.6 04/19/2017 0934   BILIDIR 0.2 02/10/2015 1625  IBILI 0.5 02/10/2015 1625      Component Value Date/Time   TSH 2.080 04/19/2017 0934   Results for MYCHELE, SEYLLER (MRN [id number]) as of 05/23/2017 11:52  Ref. Range 04/19/2017 09:34  Vitamin D, 25-Hydroxy Latest Ref Range: 30.0 - 100.0 ng/mL 14.1 (L)   ASSESSMENT AND PLAN: Vitamin D deficiency - Plan: Vitamin D, Ergocalciferol, (DRISDOL) 50000 units CAPS capsule, Vitamin D, Ergocalciferol, (DRISDOL) 50000 units CAPS capsule  S/p cadaver renal transplant  At risk for osteoporosis  Class 3 severe obesity with serious comorbidity and body mass index (BMI) of 45.0 to 49.9 in adult, unspecified obesity type ([place])  PLAN:  Vitamin D Deficiency [name] [name] was informed that low vitamin D levels contributes to fatigue and are associated with obesity, breast, and colon cancer. She agrees to continue to take prescription Vit D @50 ,000 IU every week. We will refill Vit D for 3 months (90 days) due to health insurance ending tonight. [name] [name] will follow up for routine testing of vitamin D, at least 2-3 times per year. She was informed of the risk of over-replacement of vitamin D and agrees to not increase her dose unless she discusses this with [place] first. [name] [name] agrees to follow up with our clinic in 6 weeks.  At risk for osteopenia and osteoporosis [name] [name] is at risk for osteopenia and osteoporosis due to her vitamin D deficiency. She was encouraged to take her vitamin D and follow her higher calcium diet and increase strengthening exercise to help strengthen her bones and decrease her risk of osteopenia and osteoporosis.  Status Post Renal Transplant [name] [name] is encouraged to contact nephrology to see if samples are available and work on [employer] as  soon as possible. She is to continue to work on weight loss to help decrease strain on kidney.   Obesity [name] [name] is currently in the action stage of change. As such, her goal is to continue with weight loss efforts She has agreed to follow the Category 2 plan [name] [name] has been instructed to work up to a goal of 150 minutes of combined cardio and strengthening exercise per week for weight loss and overall health benefits. We discussed the following Behavioral Modification Strategies today: increasing lean protein intake, decreasing simple carbohydrates  and emotional eating strategies  [name] [name] has agreed to follow up with our clinic in 6 weeks. She was informed of the importance of frequent follow up visits to maximize her success with intensive lifestyle modifications for her multiple health conditions.   OBESITY BEHAVIORAL INTERVENTION VISIT  Today's visit was # 3 out of 22.  Starting weight: 283 lbs Starting date: 04/19/17 Today's weight : 277 lbs Today's date: 05/22/2017 Total lbs lost to date: 6 (Patients must lose 7 lbs in the first 6 months to continue with counseling)   ASK: We discussed the diagnosis of obesity with [name] [name] today and [name] [name] agreed to give [place] permission to discuss obesity behavioral modification therapy today.  ASSESS: Honore has the diagnosis of obesity and her BMI today is 64.1 Laree is in the action stage of change   ADVISE: Neysa was educated on the multiple health risks of obesity as well as the benefit of weight loss to improve her health. She was advised of the need for long term treatment and the importance of lifestyle modifications.  AGREE: Multiple dietary modification options and treatment options were discussed and  Neelie agreed to the above obesity treatment plan.  [name] [name], am acting as Location manager for [employer],  MD  I have reviewed the above documentation for accuracy and completeness, and I agree with the above. -Dennard Nip, MD

## 2017-05-24 MED ORDER — VITAMIN D (ERGOCALCIFEROL) 1.25 MG (50000 UNIT) PO CAPS: 50000.0000 [IU] | ORAL_CAPSULE | ORAL | 0 refills | Status: DC

## 2017-07-03 ENCOUNTER — Ambulatory Visit (INDEPENDENT_AMBULATORY_CARE_PROVIDER_SITE_OTHER): Payer: BLUE CROSS/BLUE SHIELD | Admitting: Family Medicine

## 2017-07-14 DIAGNOSIS — R197 Diarrhea, unspecified: Secondary | ICD-10-CM | POA: Diagnosis not present

## 2017-07-24 ENCOUNTER — Ambulatory Visit (INDEPENDENT_AMBULATORY_CARE_PROVIDER_SITE_OTHER): Payer: BLUE CROSS/BLUE SHIELD | Admitting: Family Medicine

## 2017-07-24 VITALS — BP 134/82 | HR 79 | Temp 97.8°F | Ht 65.0 in | Wt 279.0 lb

## 2017-07-24 DIAGNOSIS — E559 Vitamin D deficiency, unspecified: Secondary | ICD-10-CM | POA: Diagnosis not present

## 2017-07-24 DIAGNOSIS — Z6841 Body Mass Index (BMI) 40.0 and over, adult: Secondary | ICD-10-CM | POA: Diagnosis not present

## 2017-07-24 DIAGNOSIS — Z9189 Other specified personal risk factors, not elsewhere classified: Secondary | ICD-10-CM | POA: Diagnosis not present

## 2017-07-24 NOTE — Progress Notes (Signed)
Office: [phone]  /  Fax: [phone]   HPI:   Chief Complaint: OBESITY [name] [name] is here to discuss her progress with her obesity treatment plan. She is on the Category 2 plan and is following her eating plan approximately 60 % of the time. She states she is walking for 10-20 minutes 2-3 times per week. [name] [name]'s last visit was approximately 2 months ago, she is still struggling to follow a structured plan and notes decreased support at home.  Her weight is 279 lb (126.6 kg) today and has gained 2 pounds since her last visit. She has lost 4 lbs since starting treatment with [place].  Vitamin D Deficiency [name] [name] has a diagnosis of vitamin D deficiency. She has missed some doses of Vit D, level not yet at goal. She notes no improvement in fatigue and denies nausea, vomiting or muscle weakness.  At risk for osteopenia and osteoporosis [name] [name] is at higher risk of osteopenia and osteoporosis due to vitamin D deficiency.   ALLERGIES: Allergies  Allergen Reactions  . Tape   . Vancomycin     MEDICATIONS: Current Outpatient Medications on File Prior to Visit  Medication Sig Dispense Refill  . aspirin 325 MG tablet Take 325 mg by mouth daily.    Marland Kitchen atorvastatin (LIPITOR) 40 MG tablet Take 40 mg by mouth at bedtime.     Marland Kitchen glimepiride (AMARYL) 4 MG tablet Take 4 mg by mouth daily with breakfast.    . Insulin Degludec (TRESIBA) 100 UNIT/ML SOLN Inject 40 Units into the skin once.     . linagliptin (TRADJENTA) 5 MG TABS tablet Take 5 mg by mouth daily.    . mycophenolate (MYFORTIC) 360 MG TBEC EC tablet Take 720 mg by mouth 2 (two) times daily.    Marland Kitchen PROGRAF 0.5 MG capsule Take 0.5 mg by mouth at bedtime. Take along with 1 mg tablet at qhs=1.5 mg  5  . tacrolimus (PROGRAF) 1 MG capsule Take 1-2 mg by mouth 2 (two) times daily. Take 2 tabs (2 mg) in the am and Take 1 tab (1 mg) along with 0.5 mg tab=1.5 mg at qhs.    . Vitamin D, Ergocalciferol, (DRISDOL) 50000 units CAPS capsule Take 1 capsule (50,000  Units total) by mouth every 7 (seven) days. 12 capsule 0   No current facility-administered medications on file prior to visit.     PAST MEDICAL HISTORY: Past Medical History:  Diagnosis Date  . Chronic kidney disease   . Diabetes mellitus without complication ([place])   . Eczema   . Enlarged kidney    bilateral  . Gout    previously  . Hyperlipidemia   . Hypertension   . Kidney transplanted   . Polycystic kidney disease   . Psoriasis     PAST SURGICAL HISTORY: Past Surgical History:  Procedure Laterality Date  . ABDOMINAL HYSTERECTOMY  2000?   complete  . CATARACT EXTRACTION  2011   bilateral  . [place]  . HERNIA REPAIR  6754   umbilical hernia  . HERNIA REPAIR  2013  . NEPHRECTOMY TRANSPLANTED ORGAN    . PERITONEAL CATHETER INSERTION    . VENTRAL HERNIA REPAIR  07/04/2011   Procedure: HERNIA REPAIR VENTRAL ADULT;  Surgeon: Imogene Burn. Georgette Dover, MD;  Location: [facility];  Service: General;  Laterality: N/A;    SOCIAL HISTORY: Social History   Tobacco Use  . Smoking status: Never Smoker  . Smokeless tobacco: Never Used  Substance Use Topics  .  Alcohol use: No  . Drug use: No    FAMILY HISTORY: Family History  Problem Relation Age of Onset  . Hypertension Father   . Polycystic kidney disease Father        died before starting dialysis was near ESRD  . Cerebral aneurysm Father        died age 76  . Stroke Father   . Cancer Sister 35       cancer that developed in the fatty tissues of the muscle  . Hypertension Brother   . Polycystic kidney disease Brother        transplant  7 years ago    ROS: Review of Systems  Constitutional: Positive for malaise/fatigue. Negative for weight loss.  Gastrointestinal: Negative for nausea and vomiting.  Musculoskeletal:       Negative muscle weakness    PHYSICAL EXAM: Blood pressure 134/82, pulse 79, temperature 97.8 F (36.6 C), temperature source Oral, height 5\' 5"  (1.651 m), weight 279 lb (126.6  kg), SpO2 97 %. Body mass index is 46.43 kg/m. Physical Exam  Constitutional: She is oriented to person, place, and time. She appears well-developed and well-nourished.  Cardiovascular: Normal rate.  Pulmonary/Chest: Effort normal.  Musculoskeletal: Normal range of motion.  Neurological: She is oriented to person, place, and time.  Skin: Skin is warm and dry.  Psychiatric: She has a normal mood and affect. Her behavior is normal.  Vitals reviewed.   RECENT LABS AND TESTS: BMET    Component Value Date/Time   NA 142 04/19/2017 0934   K 4.4 04/19/2017 0934   CL 106 04/19/2017 0934   CO2 20 04/19/2017 0934   GLUCOSE 140 (H) 04/19/2017 0934   GLUCOSE 122 (H) 02/10/2015 1625   BUN 14 04/19/2017 0934   CREATININE 1.01 (H) 04/19/2017 0934   CALCIUM 9.2 04/19/2017 0934   GFRNONAA 65 04/19/2017 0934   GFRAA 74 04/19/2017 0934   Lab Results  Component Value Date   HGBA1C 7.7 (H) 04/19/2017   Lab Results  Component Value Date   INSULIN 26.1 (H) 04/19/2017   CBC    Component Value Date/Time   WBC 6.4 04/19/2017 0934   WBC 7.7 02/10/2015 1625   RBC 4.58 04/19/2017 0934   RBC 4.50 02/10/2015 1625   HGB 13.0 04/19/2017 0934   HCT 39.9 04/19/2017 0934   PLT 217 02/10/2015 1625   MCV 87 04/19/2017 0934   MCH 28.4 04/19/2017 0934   MCH 28.2 02/10/2015 1625   MCHC 32.6 04/19/2017 0934   MCHC 32.9 02/10/2015 1625   RDW 14.1 04/19/2017 0934   LYMPHSABS 1.0 04/19/2017 0934   MONOABS 0.6 07/03/2011 1807   EOSABS 0.2 04/19/2017 0934   BASOSABS 0.0 04/19/2017 0934   Iron/TIBC/Ferritin/ %Sat No results found for: IRON, TIBC, FERRITIN, IRONPCTSAT Lipid Panel     Component Value Date/Time   CHOL 115 04/19/2017 0934   TRIG 63 04/19/2017 0934   HDL 46 04/19/2017 0934   LDLCALC 56 04/19/2017 0934   Hepatic Function Panel     Component Value Date/Time   PROT 6.5 04/19/2017 0934   ALBUMIN 4.4 04/19/2017 0934   AST 13 04/19/2017 0934   ALT 20 04/19/2017 0934   ALKPHOS 77  04/19/2017 0934   BILITOT 0.6 04/19/2017 0934   BILIDIR 0.2 02/10/2015 1625   IBILI 0.5 02/10/2015 1625      Component Value Date/Time   TSH 2.080 04/19/2017 0934  Results for DEOSHA, WERDEN (MRN [id number]) as of 07/24/2017 12:01  Ref. Range 04/19/2017 09:34  Vitamin D, 25-Hydroxy Latest Ref Range: 30.0 - 100.0 ng/mL 14.1 (L)    ASSESSMENT AND PLAN: Vitamin D deficiency  At risk for osteoporosis  Class 3 severe obesity with serious comorbidity and body mass index (BMI) of 45.0 to 49.9 in adult, unspecified obesity type ([place])  PLAN:  Vitamin D Deficiency [name] [name] was informed that low vitamin D levels contributes to fatigue and are associated with obesity, breast, and colon cancer. [name] [name] agrees to continue taking prescription Vit D @50 ,000 IU every week. We discussed ways to help her remember to take her Vit D. She will follow up for routine testing of vitamin D, at least 2-3 times per year. She was informed of the risk of over-replacement of vitamin D and agrees to not increase her dose unless she discusses this with [place] first. We plan to recheck labs in 6 weeks and [name] [name] agrees to follow up with our clinic in 3 weeks.  At risk for osteopenia and osteoporosis [name] [name] is at risk for osteopenia and osteoporsis due to her vitamin D deficiency. She was encouraged to take her vitamin D and follow her higher calcium diet and increase strengthening exercise to help strengthen her bones and decrease her risk of osteopenia and osteoporosis.  Obesity [name] [name] is currently in the action stage of change. As such, her goal is to continue with weight loss efforts She has agreed to change to keep a food journal with 1100-1400 calories and 75 grams of protein daily [name] [name] has been instructed to work up to a goal of 150 minutes of combined cardio and strengthening exercise per week for weight loss and overall health benefits. We discussed the following Behavioral Modification Strategies today: increasing lean  protein intake, work on meal planning and easy cooking plans and dealing with family or coworker sabotage   [name] [name] has agreed to follow up with our clinic in 3 weeks. She was informed of the importance of frequent follow up visits to maximize her success with intensive lifestyle modifications for her multiple health conditions.   OBESITY BEHAVIORAL INTERVENTION VISIT  Today's visit was # 4 out of 22.  Starting weight: 283 lbs Starting date: 04/19/17 Today's weight : 279 lbs  Today's date: 07/24/2017 Total lbs lost to date: 4    ASK: We discussed the diagnosis of obesity with [name] [name] today and [name] [name] agreed to give [place] permission to discuss obesity behavioral modification therapy today.  ASSESS: Karaline has the diagnosis of obesity and her BMI today is 46.43 Reilley is in the action stage of change   ADVISE: Zamoria was educated on the multiple health risks of obesity as well as the benefit of weight loss to improve her health. She was advised of the need for long term treatment and the importance of lifestyle modifications.  AGREE: Multiple dietary modification options and treatment options were discussed and  Kazi agreed to the above obesity treatment plan.  I, Trixie Dredge, am acting as transcriptionist for Dennard Nip, MD  I have reviewed the above documentation for accuracy and completeness, and I agree with the above. -Dennard Nip, MD

## 2017-07-25 DIAGNOSIS — I129 Hypertensive chronic kidney disease with stage 1 through stage 4 chronic kidney disease, or unspecified chronic kidney disease: Secondary | ICD-10-CM | POA: Diagnosis not present

## 2017-07-25 DIAGNOSIS — M109 Gout, unspecified: Secondary | ICD-10-CM | POA: Diagnosis not present

## 2017-07-25 DIAGNOSIS — R739 Hyperglycemia, unspecified: Secondary | ICD-10-CM | POA: Diagnosis not present

## 2017-07-25 DIAGNOSIS — Z940 Kidney transplant status: Secondary | ICD-10-CM | POA: Diagnosis not present

## 2017-07-25 DIAGNOSIS — E785 Hyperlipidemia, unspecified: Secondary | ICD-10-CM | POA: Diagnosis not present

## 2017-07-27 DIAGNOSIS — Z79899 Other long term (current) drug therapy: Secondary | ICD-10-CM | POA: Diagnosis not present

## 2017-07-27 DIAGNOSIS — I129 Hypertensive chronic kidney disease with stage 1 through stage 4 chronic kidney disease, or unspecified chronic kidney disease: Secondary | ICD-10-CM | POA: Diagnosis not present

## 2017-07-27 DIAGNOSIS — R739 Hyperglycemia, unspecified: Secondary | ICD-10-CM | POA: Diagnosis not present

## 2017-07-27 DIAGNOSIS — Z940 Kidney transplant status: Secondary | ICD-10-CM | POA: Diagnosis not present

## 2017-07-29 ENCOUNTER — Other Ambulatory Visit: Payer: Self-pay

## 2017-07-29 ENCOUNTER — Encounter (HOSPITAL_BASED_OUTPATIENT_CLINIC_OR_DEPARTMENT_OTHER): Payer: Self-pay | Admitting: *Deleted

## 2017-07-29 ENCOUNTER — Emergency Department (HOSPITAL_BASED_OUTPATIENT_CLINIC_OR_DEPARTMENT_OTHER)
Admission: EM | Admit: 2017-07-29 | Discharge: 2017-07-29 | Disposition: A | Discharge status: Home / Self Care | Payer: BLUE CROSS/BLUE SHIELD | Attending: Emergency Medicine | Admitting: Emergency Medicine

## 2017-07-29 DIAGNOSIS — H7291 Unspecified perforation of tympanic membrane, right ear: Secondary | ICD-10-CM

## 2017-07-29 DIAGNOSIS — Z79899 Other long term (current) drug therapy: Secondary | ICD-10-CM | POA: Insufficient documentation

## 2017-07-29 DIAGNOSIS — I120 Hypertensive chronic kidney disease with stage 5 chronic kidney disease or end stage renal disease: Secondary | ICD-10-CM | POA: Insufficient documentation

## 2017-07-29 DIAGNOSIS — N186 End stage renal disease: Secondary | ICD-10-CM | POA: Insufficient documentation

## 2017-07-29 DIAGNOSIS — Z7982 Long term (current) use of aspirin: Secondary | ICD-10-CM | POA: Insufficient documentation

## 2017-07-29 DIAGNOSIS — E1122 Type 2 diabetes mellitus with diabetic chronic kidney disease: Secondary | ICD-10-CM | POA: Diagnosis not present

## 2017-07-29 DIAGNOSIS — H9201 Otalgia, right ear: Secondary | ICD-10-CM | POA: Diagnosis present

## 2017-07-29 MED ORDER — OFLOXACIN 0.3 % OP SOLN
5.0000 [drp] | Freq: Every day | OPHTHALMIC | Status: DC
  Administered 2017-07-29: 5 [drp] via OTIC
  Filled 2017-07-29: qty 5

## 2017-07-29 NOTE — Discharge Instructions (Signed)
Please read and follow all provided instructions.  Your diagnoses today include:  1. Perforation of right tympanic membrane     Tests performed today include:  Vital signs. See below for your results today.   Medications prescribed:   Ofloxacin (ear drops) - Use 10 drops in affected ear twice a day  Take any prescribed medications only as directed.  Home care instructions:  Follow any educational materials contained in this packet.  BE VERY CAREFUL not to take multiple medicines containing Tylenol (also called acetaminophen). Doing so can lead to an overdose which can damage your liver and cause liver failure and possibly death.   Follow-up instructions: Please follow-up with the ENT doctor listed in the next 5 days for further evaluation of your symptoms.   Return instructions:   Please return to the Emergency Department if you experience worsening symptoms.   Please return if you have any other emergent concerns.  Additional Information:  Your vital signs today were: BP (!) 129/97 (BP Location: Left Arm)    Pulse 69    Temp 97.9 F (36.6 C) (Oral)    Resp 18    SpO2 100%  If your blood pressure (BP) was elevated above 135/85 this visit, please have this repeated by your doctor within one month. --------------

## 2017-07-29 NOTE — ED Triage Notes (Signed)
Pt states she was working outside today and after coming inside she noticed some blood on her right ear. States she thought she may have scratched  It. After taking afternoon nap she stated the was blood on her pillow and she can't hear out of that ear

## 2017-07-29 NOTE — ED Provider Notes (Signed)
[facility] EMERGENCY DEPARTMENT Provider Note   CSN: [id number] Arrival date & time: 07/29/17  1821     History   Chief Complaint Chief Complaint  Patient presents with  . Otalgia    HPI [name] [name] is a 52 y.o. female.  Patient with history of kidney transplant on immunosuppressive therapy, DM -- presents with complaint of acute onset of hearing loss in her right ear with associated bloody discharge.  Patient reports no symptoms leading up to this morning when she noted a small amount of blood coming from her right ear.  She states that she took a nap and when she woke up she could not hear out of the ear and had continued drainage.  Patient denies any injuries including Q-tips into the ear canal.  She denies any head injuries.  She denies any infection type symptoms prior to today.  No treatments prior to arrival or other than peroxide instilled into the ear canal.  This did not help her symptoms.  Course is constant.  Nothing makes symptoms better or worse.     Past Medical History:  Diagnosis Date  . Chronic kidney disease   . Diabetes mellitus without complication ([place])   . Eczema   . Enlarged kidney    bilateral  . Gout    previously  . Hyperlipidemia   . Hypertension   . Kidney transplanted   . Polycystic kidney disease   . Psoriasis     Patient Active Problem List   Diagnosis Date Noted  . Other fatigue 04/19/2017  . Shortness of breath on exertion 04/19/2017  . Polycystic kidney disease 04/19/2017  . Type 2 diabetes mellitus without complication, with long-term current use of insulin ([place]) 04/19/2017  . Incarcerated ventral hernia 08/04/2011  . Small bowel obstruction ([place]) 07/04/2011  . ESRD (end stage renal disease) on dialysis ([place]) 07/04/2011  . Abdominal pain 07/04/2011  . Preop cardiovascular exam 06/07/2011  . Renal insufficiency 06/07/2011  . Hyperlipidemia 06/07/2011    Past Surgical History:  Procedure Laterality Date  .  ABDOMINAL HYSTERECTOMY  2000?   complete  . CATARACT EXTRACTION  2011   bilateral  . [place]  . HERNIA REPAIR  8413   umbilical hernia  . HERNIA REPAIR  2013  . NEPHRECTOMY TRANSPLANTED ORGAN    . PERITONEAL CATHETER INSERTION    . VENTRAL HERNIA REPAIR  07/04/2011   Procedure: HERNIA REPAIR VENTRAL ADULT;  Surgeon: Imogene Burn. Georgette Dover, MD;  Location: [facility];  Service: General;  Laterality: N/A;     OB History    Gravida  2   Para  2   Term      Preterm      AB      Living  2     SAB      TAB      Ectopic      Multiple      Live Births               Home Medications    Prior to Admission medications   Medication Sig Start Date End Date Taking? Authorizing Provider  aspirin 325 MG tablet Take 325 mg by mouth daily.   Yes [provider]  atorvastatin (LIPITOR) 40 MG tablet Take 40 mg by mouth at bedtime.  08/07/10  Yes [provider]  glimepiride (AMARYL) 4 MG tablet Take 4 mg by mouth daily with breakfast.   Yes [provider]  Insulin Degludec (TRESIBA) 100 UNIT/ML SOLN Inject 40 Units into the skin once.    Yes [provider]  linagliptin (TRADJENTA) 5 MG TABS tablet Take 5 mg by mouth daily.   Yes [provider]  mycophenolate (MYFORTIC) 360 MG TBEC EC tablet Take 720 mg by mouth 2 (two) times daily.   Yes [provider]  PROGRAF 0.5 MG capsule Take 0.5 mg by mouth at bedtime. Take along with 1 mg tablet at qhs=1.5 mg 02/02/15  Yes [provider]  tacrolimus (PROGRAF) 1 MG capsule Take 1-2 mg by mouth 2 (two) times daily. Take 2 tabs (2 mg) in the am and Take 1 tab (1 mg) along with 0.5 mg tab=1.5 mg at qhs.   Yes [provider]  Vitamin D, Ergocalciferol, (DRISDOL) 50000 units CAPS capsule Take 1 capsule (50,000 Units total) by mouth every 7 (seven) days. 05/24/17  Yes Dennard Nip D, MD    Family History Family History  Problem Relation Age of Onset  .  Hypertension Father   . Polycystic kidney disease Father        died before starting dialysis was near ESRD  . Cerebral aneurysm Father        died age 55  . Stroke Father   . Cancer Sister 36       cancer that developed in the fatty tissues of the muscle  . Hypertension Brother   . Polycystic kidney disease Brother        transplant  7 years ago    Social History Social History   Tobacco Use  . Smoking status: Never Smoker  . Smokeless tobacco: Never Used  Substance Use Topics  . Alcohol use: No  . Drug use: No     Allergies   Tape and Vancomycin   Review of Systems Review of Systems  Constitutional: Negative for chills, fatigue and fever.  HENT: Positive for ear pain and hearing loss. Negative for congestion, rhinorrhea, sinus pressure, sore throat and tinnitus.   Eyes: Negative for redness.  Respiratory: Negative for cough and wheezing.   Gastrointestinal: Negative for abdominal pain, diarrhea, nausea and vomiting.  Genitourinary: Negative for dysuria.  Musculoskeletal: Negative for myalgias and neck stiffness.  Skin: Negative for rash.  Neurological: Negative for headaches.  Hematological: Negative for adenopathy.     Physical Exam Updated Vital Signs BP (!) 129/97 (BP Location: Left Arm)   Pulse 69   Temp 97.9 F (36.6 C) (Oral)   Resp 18   SpO2 100%   Physical Exam  Constitutional: She appears well-developed and well-nourished.  HENT:  Head: Normocephalic and atraumatic.  Right Ear: External ear normal. Tympanic membrane is perforated.  Left Ear: Tympanic membrane, external ear and ear canal normal.  Ears:  Nose: Nose normal. No mucosal edema or rhinorrhea.  Mouth/Throat: Uvula is midline, oropharynx is clear and moist and mucous membranes are normal. Mucous membranes are not dry. No oral lesions. No trismus in the jaw. No uvula swelling. No oropharyngeal exudate, posterior oropharyngeal edema, posterior oropharyngeal erythema or tonsillar  abscesses.  Eyes: Conjunctivae are normal. Right eye exhibits no discharge. Left eye exhibits no discharge.  Neck: Normal range of motion. Neck supple.  Cardiovascular: Normal rate, regular rhythm and normal heart sounds.  Pulmonary/Chest: Effort normal and breath sounds normal. No respiratory distress. She has no wheezes. She has no rales.  Abdominal: Soft. There is no tenderness.  Lymphadenopathy:    She has no cervical adenopathy.  Neurological: She is  alert.  Skin: Skin is warm and dry.  Psychiatric: She has a normal mood and affect.  Nursing note and vitals reviewed.    ED Treatments / Results  Labs (all labs ordered are listed, but only abnormal results are displayed) Labs Reviewed - No data to display  EKG None  Radiology No results found.  Procedures Procedures (including critical care time)  Medications Ordered in ED Medications  ofloxacin (OCUFLOX) 0.3 % ophthalmic solution 5 drop (5 drops Right EAR Given 07/29/17 1902)     Initial Impression / Assessment and Plan / ED Course  I have reviewed the triage vital signs and the nursing notes.  Pertinent labs & imaging results that were available during my care of the patient were reviewed by me and considered in my medical decision making (see chart for details).     Patient seen and examined.  Vital signs reviewed and are as follows: BP (!) 129/97 (BP Location: Left Arm)   Pulse 69   Temp 97.9 F (36.6 C) (Oral)   Resp 18   SpO2 100%   Patient to be treated with ofloxacin eardrops and will follow-up with ENT next week.  Referral given.  Encouraged to return with worsening symptoms, worsening pain, fevers, new symptoms or other concerns.  She verbalizes understanding agrees with plan.  Final Clinical Impressions(s) / ED Diagnoses   Final diagnoses:  Perforation of right tympanic membrane   Patient with suspected right tympanic membrane perforation with bloody drainage but etiology is uncertain.  No  other systemic symptoms.  ENT follow-up indicated.  ED Discharge Orders    None       Carlisle Cater, Hershal Coria 07/29/17 1909    Tegeler, Gwenyth Allegra, MD 07/30/17 [phone]

## 2017-07-31 DIAGNOSIS — H60311 Diffuse otitis externa, right ear: Secondary | ICD-10-CM | POA: Diagnosis not present

## 2017-08-08 ENCOUNTER — Ambulatory Visit (INDEPENDENT_AMBULATORY_CARE_PROVIDER_SITE_OTHER): Payer: BLUE CROSS/BLUE SHIELD | Admitting: Family Medicine

## 2017-08-08 VITALS — BP 125/82 | HR 85 | Temp 97.8°F | Ht 65.0 in | Wt 281.0 lb

## 2017-08-08 DIAGNOSIS — E66813 Obesity, class 3: Secondary | ICD-10-CM

## 2017-08-08 DIAGNOSIS — Z9189 Other specified personal risk factors, not elsewhere classified: Secondary | ICD-10-CM | POA: Diagnosis not present

## 2017-08-08 DIAGNOSIS — E559 Vitamin D deficiency, unspecified: Secondary | ICD-10-CM | POA: Diagnosis not present

## 2017-08-08 DIAGNOSIS — Z6841 Body Mass Index (BMI) 40.0 and over, adult: Secondary | ICD-10-CM | POA: Diagnosis not present

## 2017-08-08 MED ORDER — VITAMIN D (ERGOCALCIFEROL) 1.25 MG (50000 UNIT) PO CAPS: 50000.0000 [IU] | ORAL_CAPSULE | ORAL | 0 refills | Status: DC

## 2017-08-09 NOTE — Progress Notes (Signed)
Office: [phone]  /  Fax: [phone]   HPI:   Chief Complaint: OBESITY [name] [name] is here to discuss her progress with her obesity treatment plan. She is on the keep a food journal with 1100 to 1400 calories and 75 grams of protein daily and is following her eating plan approximately 70 % of the time. She states she is exercising 0 minutes 0 times per week. [name] [name] has been off track with her diet prescription. Her husband is moving to [place] for a new job. She is packing and not concentrating on diet, especially with increased stress. Her weight is 281 lb (127.5 kg) today and has had a weight gain of 2 pounds over a period of 2 weeks since her last visit. She has lost 2 lbs since starting treatment with [place].  Vitamin D deficiency [name] [name] has a diagnosis of vitamin D deficiency. She is stable on vit D, but is not yet at goal. She denies nausea, vomiting or muscle weakness.  .At risk for osteopenia and osteoporosis [name] [name] is at higher risk of osteopenia and osteoporosis due to vitamin D deficiency.   ALLERGIES: Allergies  Allergen Reactions  . Tape   . Vancomycin     MEDICATIONS: Current Outpatient Medications on File Prior to Visit  Medication Sig Dispense Refill  . aspirin 325 MG tablet Take 325 mg by mouth daily.    Marland Kitchen atorvastatin (LIPITOR) 40 MG tablet Take 40 mg by mouth at bedtime.     Marland Kitchen glimepiride (AMARYL) 4 MG tablet Take 4 mg by mouth daily with breakfast.    . Insulin Degludec (TRESIBA) 100 UNIT/ML SOLN Inject 40 Units into the skin once.     . linagliptin (TRADJENTA) 5 MG TABS tablet Take 5 mg by mouth daily.    . mycophenolate (MYFORTIC) 360 MG TBEC EC tablet Take 720 mg by mouth 2 (two) times daily.    Marland Kitchen PROGRAF 0.5 MG capsule Take 0.5 mg by mouth at bedtime. Take along with 1 mg tablet at qhs=1.5 mg  5  . tacrolimus (PROGRAF) 1 MG capsule Take 1-2 mg by mouth 2 (two) times daily. Take 2 tabs (2 mg) in the am and Take 1 tab (1 mg) along with 0.5 mg tab=1.5 mg at qhs.       No current facility-administered medications on file prior to visit.     PAST MEDICAL HISTORY: Past Medical History:  Diagnosis Date  . Chronic kidney disease   . Diabetes mellitus without complication ([place])   . Eczema   . Enlarged kidney    bilateral  . Gout    previously  . Hyperlipidemia   . Hypertension   . Kidney transplanted   . Polycystic kidney disease   . Psoriasis     PAST SURGICAL HISTORY: Past Surgical History:  Procedure Laterality Date  . ABDOMINAL HYSTERECTOMY  2000?   complete  . CATARACT EXTRACTION  2011   bilateral  . [place]  . HERNIA REPAIR  6644   umbilical hernia  . HERNIA REPAIR  2013  . NEPHRECTOMY TRANSPLANTED ORGAN    . PERITONEAL CATHETER INSERTION    . VENTRAL HERNIA REPAIR  07/04/2011   Procedure: HERNIA REPAIR VENTRAL ADULT;  Surgeon: Imogene Burn. Georgette Dover, MD;  Location: [facility];  Service: General;  Laterality: N/A;    SOCIAL HISTORY: Social History   Tobacco Use  . Smoking status: Never Smoker  . Smokeless tobacco: Never Used  Substance Use Topics  . Alcohol use:  No  . Drug use: No    FAMILY HISTORY: Family History  Problem Relation Age of Onset  . Hypertension Father   . Polycystic kidney disease Father        died before starting dialysis was near ESRD  . Cerebral aneurysm Father        died age 51  . Stroke Father   . Cancer Sister 48       cancer that developed in the fatty tissues of the muscle  . Hypertension Brother   . Polycystic kidney disease Brother        transplant  7 years ago    ROS: Review of Systems  Constitutional: Negative for weight loss.  Gastrointestinal: Negative for nausea and vomiting.  Musculoskeletal:       Negative for muscle weakness    PHYSICAL EXAM: Blood pressure 125/82, pulse 85, temperature 97.8 F (36.6 C), temperature source Oral, height 5\' 5"  (1.651 m), weight 281 lb (127.5 kg), SpO2 98 %. Body mass index is 46.76 kg/m. Physical Exam  Constitutional:  She is oriented to person, place, and time. She appears well-developed and well-nourished.  Cardiovascular: Normal rate.  Pulmonary/Chest: Effort normal.  Musculoskeletal: Normal range of motion.  Neurological: She is oriented to person, place, and time.  Skin: Skin is warm and dry.  Psychiatric: She has a normal mood and affect. Her behavior is normal.  Vitals reviewed.   RECENT LABS AND TESTS: BMET    Component Value Date/Time   NA 142 04/19/2017 0934   K 4.4 04/19/2017 0934   CL 106 04/19/2017 0934   CO2 20 04/19/2017 0934   GLUCOSE 140 (H) 04/19/2017 0934   GLUCOSE 122 (H) 02/10/2015 1625   BUN 14 04/19/2017 0934   CREATININE 1.01 (H) 04/19/2017 0934   CALCIUM 9.2 04/19/2017 0934   GFRNONAA 65 04/19/2017 0934   GFRAA 74 04/19/2017 0934   Lab Results  Component Value Date   HGBA1C 7.7 (H) 04/19/2017   Lab Results  Component Value Date   INSULIN 26.1 (H) 04/19/2017   CBC    Component Value Date/Time   WBC 6.4 04/19/2017 0934   WBC 7.7 02/10/2015 1625   RBC 4.58 04/19/2017 0934   RBC 4.50 02/10/2015 1625   HGB 13.0 04/19/2017 0934   HCT 39.9 04/19/2017 0934   PLT 217 02/10/2015 1625   MCV 87 04/19/2017 0934   MCH 28.4 04/19/2017 0934   MCH 28.2 02/10/2015 1625   MCHC 32.6 04/19/2017 0934   MCHC 32.9 02/10/2015 1625   RDW 14.1 04/19/2017 0934   LYMPHSABS 1.0 04/19/2017 0934   MONOABS 0.6 07/03/2011 1807   EOSABS 0.2 04/19/2017 0934   BASOSABS 0.0 04/19/2017 0934   Iron/TIBC/Ferritin/ %Sat No results found for: IRON, TIBC, FERRITIN, IRONPCTSAT Lipid Panel     Component Value Date/Time   CHOL 115 04/19/2017 0934   TRIG 63 04/19/2017 0934   HDL 46 04/19/2017 0934   LDLCALC 56 04/19/2017 0934   Hepatic Function Panel     Component Value Date/Time   PROT 6.5 04/19/2017 0934   ALBUMIN 4.4 04/19/2017 0934   AST 13 04/19/2017 0934   ALT 20 04/19/2017 0934   ALKPHOS 77 04/19/2017 0934   BILITOT 0.6 04/19/2017 0934   BILIDIR 0.2 02/10/2015 1625   IBILI  0.5 02/10/2015 1625      Component Value Date/Time   TSH 2.080 04/19/2017 0934   Results for [name], [name] (MRN [id number]) as of 08/09/2017 15:18  Ref. Range 04/19/2017  09:34  Vitamin D, 25-Hydroxy Latest Ref Range: 30.0 - 100.0 ng/mL 14.1 (L)   ASSESSMENT AND PLAN: Vitamin D deficiency - Plan: Vitamin D, Ergocalciferol, (DRISDOL) 50000 units CAPS capsule  At risk for osteoporosis  Class 3 severe obesity with serious comorbidity and body mass index (BMI) of 45.0 to 49.9 in adult, unspecified obesity type ([place])  PLAN:  Vitamin D Deficiency Taysha was informed that low vitamin D levels contributes to fatigue and are associated with obesity, breast, and colon cancer. She agrees to continue to take prescription Vit D @50 ,000 IU every week. We will refill for 1 month and will follow up for routine testing of vitamin D, at least 2-3 times per year. She was informed of the risk of over-replacement of vitamin D and agrees to not increase her dose unless she discusses this with [place] first. Thelda agrees to follow up as directed.  At risk for osteopenia and osteoporosis [name] [name] is at risk for osteopenia and osteoporosis due to her vitamin D deficiency. She was encouraged to take her vitamin D and follow her higher calcium diet and increase strengthening exercise to help strengthen her bones and decrease her risk of osteopenia and osteoporosis.  Obesity [name] [name] is currently in the action stage of change. As such, her goal is to maintain weight for the next few weeks, while getting back to her normal routine. She has agreed to portion control better and make smarter food choices, such as increase vegetables and decrease simple carbohydrates  [name] [name] has been instructed to work up to a goal of 150 minutes of combined cardio and strengthening exercise per week for weight loss and overall health benefits. We discussed the following Behavioral Modification Strategies today: better snacking choices, increasing  lean protein intake, decreasing simple carbohydrates , work on meal planning and easy cooking plans, dealing with family or coworker sabotage and travel eating strategies   [name] [name] has agreed to follow up with our clinic in 3 weeks. She was informed of the importance of frequent follow up visits to maximize her success with intensive lifestyle modifications for her multiple health conditions.   OBESITY BEHAVIORAL INTERVENTION VISIT  Today's visit was # 5 out of 22.  Starting weight: 283 lbs Starting date: 04/19/17 Today's weight : 281 lbs Today's date: 08/08/2017 Total lbs lost to date: 2    ASK: We discussed the diagnosis of obesity with [name] [name] today and [name] [name] agreed to give [place] permission to discuss obesity behavioral modification therapy today.  ASSESS: [name] [name] has the diagnosis of obesity and her BMI today is 51.76 [name] [name] is in the action stage of change   ADVISE: [name] [name] was educated on the multiple health risks of obesity as well as the benefit of weight loss to improve her health. She was advised of the need for long term treatment and the importance of lifestyle modifications.  AGREE: Multiple dietary modification options and treatment options were discussed and  [name] [name] agreed to the above obesity treatment plan.  I, Doreene Nest, am acting as transcriptionist for Dennard Nip, MD  I have reviewed the above documentation for accuracy and completeness, and I agree with the above. -Dennard Nip, MD

## 2017-08-22 DIAGNOSIS — Z1211 Encounter for screening for malignant neoplasm of colon: Secondary | ICD-10-CM | POA: Diagnosis not present

## 2017-08-22 DIAGNOSIS — L918 Other hypertrophic disorders of the skin: Secondary | ICD-10-CM | POA: Diagnosis not present

## 2017-08-22 DIAGNOSIS — Z0000 Encounter for general adult medical examination without abnormal findings: Secondary | ICD-10-CM | POA: Diagnosis not present

## 2017-08-24 ENCOUNTER — Other Ambulatory Visit: Payer: Self-pay | Admitting: Family Medicine

## 2017-08-24 ENCOUNTER — Encounter (INDEPENDENT_AMBULATORY_CARE_PROVIDER_SITE_OTHER): Payer: Self-pay | Admitting: Family Medicine

## 2017-08-24 DIAGNOSIS — Z1231 Encounter for screening mammogram for malignant neoplasm of breast: Secondary | ICD-10-CM

## 2017-08-24 DIAGNOSIS — H60311 Diffuse otitis externa, right ear: Secondary | ICD-10-CM | POA: Diagnosis not present

## 2017-08-24 DIAGNOSIS — E2839 Other primary ovarian failure: Secondary | ICD-10-CM

## 2017-08-24 NOTE — Telephone Encounter (Signed)
Please cancel

## 2017-08-29 ENCOUNTER — Ambulatory Visit (INDEPENDENT_AMBULATORY_CARE_PROVIDER_SITE_OTHER): Payer: Self-pay | Admitting: Family Medicine

## 2017-08-29 ENCOUNTER — Encounter (INDEPENDENT_AMBULATORY_CARE_PROVIDER_SITE_OTHER): Payer: Self-pay

## 2017-08-30 ENCOUNTER — Ambulatory Visit (INDEPENDENT_AMBULATORY_CARE_PROVIDER_SITE_OTHER): Payer: BLUE CROSS/BLUE SHIELD | Admitting: Family Medicine

## 2017-08-30 VITALS — BP 131/84 | HR 84 | Temp 97.8°F | Ht 65.0 in | Wt 282.0 lb

## 2017-08-30 DIAGNOSIS — Z794 Long term (current) use of insulin: Secondary | ICD-10-CM | POA: Diagnosis not present

## 2017-08-30 DIAGNOSIS — E119 Type 2 diabetes mellitus without complications: Secondary | ICD-10-CM | POA: Diagnosis not present

## 2017-08-30 DIAGNOSIS — Z9189 Other specified personal risk factors, not elsewhere classified: Secondary | ICD-10-CM | POA: Diagnosis not present

## 2017-08-30 DIAGNOSIS — E559 Vitamin D deficiency, unspecified: Secondary | ICD-10-CM

## 2017-08-30 DIAGNOSIS — Z6841 Body Mass Index (BMI) 40.0 and over, adult: Secondary | ICD-10-CM

## 2017-08-30 MED ORDER — VITAMIN D (ERGOCALCIFEROL) 1.25 MG (50000 UNIT) PO CAPS: 50000.0000 [IU] | ORAL_CAPSULE | ORAL | 0 refills | Status: DC

## 2017-08-30 NOTE — Progress Notes (Signed)
Office: [phone]  /  Fax: [phone]   HPI:   Chief Complaint: OBESITY [name] [name] is here to discuss her progress with her obesity treatment plan. She is on the portion control better and make smarter food choices, such as increase vegetables and decrease simple carbohydrates and is following her eating plan approximately 50-60 % of the time. She states she is walking for 10-20 minutes 2 times per week. [name] [name]'s family is in the process of moving her husband to another state. She is deviating from her plan significantly but trying to make smarter choices.  Her weight is 282 lb (127.9 kg) today and has gained 1 pound since her last visit. She has lost 1 lb since starting treatment with [place].  Vitamin D Deficiency [name] [name] has a diagnosis of vitamin D deficiency. She is on prescription Vit D, but not yet at goal and she is due for labs. She denies nausea, vomiting or muscle weakness.  Diabetes II [name] [name] has a diagnosis of diabetes type II. [name] [name] is on glimepiride, Tradjenta, and Tresiba. She did not bring in BGs log today. Her last A1c was elevated at 7.7, and she is status post renal transplant approximately 6 years ago. She is struggling to follow a diabetes mellitus diet but she is making an effort. She denies any hypoglycemic episodes. She has been working on intensive lifestyle modifications including diet, exercise, and weight loss to help control her blood glucose levels.  At risk for cardiovascular disease [name] [name] is at a higher than average risk for cardiovascular disease due to obesity and diabetes II. She currently denies any chest pain.  ALLERGIES: Allergies  Allergen Reactions  . Tape   . Vancomycin     MEDICATIONS: Current Outpatient Medications on File Prior to Visit  Medication Sig Dispense Refill  . aspirin 325 MG tablet Take 325 mg by mouth daily.    [name] [name] Kitchen atorvastatin (LIPITOR) 40 MG tablet Take 40 mg by mouth at bedtime.     [name] [name] Kitchen glimepiride (AMARYL) 4 MG tablet Take 4 mg by  mouth daily with breakfast.    . Insulin Degludec (TRESIBA) 100 UNIT/ML SOLN Inject 40 Units into the skin once.     . linagliptin (TRADJENTA) 5 MG TABS tablet Take 5 mg by mouth daily.    . mycophenolate (MYFORTIC) 360 MG TBEC EC tablet Take 720 mg by mouth 2 (two) times daily.    [name] [name] Kitchen PROGRAF 0.5 MG capsule Take 0.5 mg by mouth at bedtime. Take along with 1 mg tablet at qhs=1.5 mg  5  . tacrolimus (PROGRAF) 1 MG capsule Take 1-2 mg by mouth 2 (two) times daily. Take 2 tabs (2 mg) in the am and Take 1 tab (1 mg) along with 0.5 mg tab=1.5 mg at qhs.    . Vitamin D, Ergocalciferol, (DRISDOL) 50000 units CAPS capsule Take 1 capsule (50,000 Units total) by mouth every 7 (seven) days. 12 capsule 0   No current facility-administered medications on file prior to visit.     PAST MEDICAL HISTORY: Past Medical History:  Diagnosis Date  . Chronic kidney disease   . Diabetes mellitus without complication ([place])   . Eczema   . Enlarged kidney    bilateral  . Gout    previously  . Hyperlipidemia   . Hypertension   . Kidney transplanted   . Polycystic kidney disease   . Psoriasis     PAST SURGICAL HISTORY: Past Surgical History:  Procedure Laterality Date  . ABDOMINAL HYSTERECTOMY  2000?  complete  . CATARACT EXTRACTION  2011   bilateral  . [place]  . HERNIA REPAIR  0932   umbilical hernia  . HERNIA REPAIR  2013  . NEPHRECTOMY TRANSPLANTED ORGAN    . PERITONEAL CATHETER INSERTION    . VENTRAL HERNIA REPAIR  07/04/2011   Procedure: HERNIA REPAIR VENTRAL ADULT;  Surgeon: Imogene Burn. Georgette Dover, MD;  Location: [facility];  Service: General;  Laterality: N/A;    SOCIAL HISTORY: Social History   Tobacco Use  . Smoking status: Never Smoker  . Smokeless tobacco: Never Used  Substance Use Topics  . Alcohol use: No  . Drug use: No    FAMILY HISTORY: Family History  Problem Relation Age of Onset  . Hypertension Father   . Polycystic kidney disease Father        died  before starting dialysis was near ESRD  . Cerebral aneurysm Father        died age 56  . Stroke Father   . Cancer Sister 42       cancer that developed in the fatty tissues of the muscle  . Hypertension Brother   . Polycystic kidney disease Brother        transplant  7 years ago    ROS: Review of Systems  Constitutional: Negative for weight loss.  Cardiovascular: Negative for chest pain.  Gastrointestinal: Negative for nausea and vomiting.  Musculoskeletal:       Negative muscle weakness  Endo/Heme/Allergies:       Negative hypoglycemia    PHYSICAL EXAM: Blood pressure 131/84, pulse 84, temperature 97.8 F (36.6 C), temperature source Oral, height 5\' 5"  (1.651 m), weight 282 lb (127.9 kg), SpO2 96 %. Body mass index is 46.93 kg/m. Physical Exam  Constitutional: She is oriented to person, place, and time. She appears well-developed and well-nourished.  Cardiovascular: Normal rate.  Pulmonary/Chest: Effort normal.  Musculoskeletal: Normal range of motion.  Neurological: She is oriented to person, place, and time.  Skin: Skin is warm and dry.  Psychiatric: She has a normal mood and affect. Her behavior is normal.  Vitals reviewed.   RECENT LABS AND TESTS: BMET    Component Value Date/Time   NA 142 04/19/2017 0934   K 4.4 04/19/2017 0934   CL 106 04/19/2017 0934   CO2 20 04/19/2017 0934   GLUCOSE 140 (H) 04/19/2017 0934   GLUCOSE 122 (H) 02/10/2015 1625   BUN 14 04/19/2017 0934   CREATININE 1.01 (H) 04/19/2017 0934   CALCIUM 9.2 04/19/2017 0934   GFRNONAA 65 04/19/2017 0934   GFRAA 74 04/19/2017 0934   Lab Results  Component Value Date   HGBA1C 7.7 (H) 04/19/2017   Lab Results  Component Value Date   INSULIN 26.1 (H) 04/19/2017   CBC    Component Value Date/Time   WBC 6.4 04/19/2017 0934   WBC 7.7 02/10/2015 1625   RBC 4.58 04/19/2017 0934   RBC 4.50 02/10/2015 1625   HGB 13.0 04/19/2017 0934   HCT 39.9 04/19/2017 0934   PLT 217 02/10/2015 1625    MCV 87 04/19/2017 0934   MCH 28.4 04/19/2017 0934   MCH 28.2 02/10/2015 1625   MCHC 32.6 04/19/2017 0934   MCHC 32.9 02/10/2015 1625   RDW 14.1 04/19/2017 0934   LYMPHSABS 1.0 04/19/2017 0934   MONOABS 0.6 07/03/2011 1807   EOSABS 0.2 04/19/2017 0934   BASOSABS 0.0 04/19/2017 0934   Iron/TIBC/Ferritin/ %Sat No results found for: IRON, TIBC, FERRITIN, IRONPCTSAT  Lipid Panel     Component Value Date/Time   CHOL 115 04/19/2017 0934   TRIG 63 04/19/2017 0934   HDL 46 04/19/2017 0934   LDLCALC 56 04/19/2017 0934   Hepatic Function Panel     Component Value Date/Time   PROT 6.5 04/19/2017 0934   ALBUMIN 4.4 04/19/2017 0934   AST 13 04/19/2017 0934   ALT 20 04/19/2017 0934   ALKPHOS 77 04/19/2017 0934   BILITOT 0.6 04/19/2017 0934   BILIDIR 0.2 02/10/2015 1625   IBILI 0.5 02/10/2015 1625      Component Value Date/Time   TSH 2.080 04/19/2017 0934  Results for KRYSTELLE, PRASHAD (MRN [id number]) as of 08/30/2017 08:35  Ref. Range 04/19/2017 09:34  Vitamin D, 25-Hydroxy Latest Ref Range: 30.0 - 100.0 ng/mL 14.1 (L)    ASSESSMENT AND PLAN: Vitamin D deficiency - Plan: VITAMIN D 25 Hydroxy (Vit-D Deficiency, Fractures), Vitamin D, Ergocalciferol, (DRISDOL) 50000 units CAPS capsule  Type 2 diabetes mellitus without complication, with long-term current use of insulin ([facility]) - Plan: Comprehensive metabolic panel, Hemoglobin A1c  At risk for heart disease  Class 3 severe obesity with serious comorbidity and body mass index (BMI) of 45.0 to 49.9 in adult, unspecified obesity type ([place])  PLAN:  Vitamin D Deficiency [name] [name] was informed that low vitamin D levels contributes to fatigue and are associated with obesity, breast, and colon cancer. [name] [name] agrees to continue taking prescription Vit D @50 ,000 IU every week #4 and we will refill for 1 month. She will follow up for routine testing of vitamin D, at least 2-3 times per year. She was informed of the risk of over-replacement of vitamin D  and agrees to not increase her dose unless she discusses this with [place] first. We will check labs and [place] agrees to follow up with our clinic in 2 weeks.  Diabetes II [name] [name] has been given extensive diabetes education by myself today including ideal fasting and post-prandial blood glucose readings, individual ideal Hgb A1c goals and hypoglycemia prevention. We discussed the importance of good blood sugar control to decrease the likelihood of diabetic complications such as nephropathy, neuropathy, limb loss, blindness, coronary artery disease, and death. We discussed the importance of intensive lifestyle modification including diet, exercise and weight loss as the first line treatment for diabetes. Jaidy agrees to continue her diabetes medications, diet, and exercise, and we will will check labs. [name] [name] agrees to follow up with our clinic in 2 weeks.  Cardiovascular risk counselling [name] [name] was given extended (15 minutes) coronary artery disease prevention counseling today. She is 52 y.o. female and has risk factors for heart disease including obesity and diabetes II. We discussed intensive lifestyle modifications today with an emphasis on specific weight loss instructions and strategies. Pt was also informed of the importance of increasing exercise and decreasing saturated fats to help prevent heart disease.  Obesity [name] [name] is currently in the action stage of change. As such, her goal is to maintain weight for now She has agreed to portion control better and make smarter food choices, such as increase vegetables and decrease simple carbohydrates  [name] [name] has been instructed to work up to a goal of 150 minutes of combined cardio and strengthening exercise per week for weight loss and overall health benefits. We discussed the following Behavioral Modification Strategies today: increasing lean protein intake and decreasing simple carbohydrates  [name] [name] is unable to follow a structured eating plan now. We will plan  on maintaining weight until she is ready to get  back on track.   [name] [name] has agreed to follow up with our clinic in 2 weeks. She was informed of the importance of frequent follow up visits to maximize her success with intensive lifestyle modifications for her multiple health conditions.   OBESITY BEHAVIORAL INTERVENTION VISIT  Today's visit was # 6.   Starting weight: 283 lbs Starting date: 04/19/17 Today's weight : 282 lbs  Today's date: 08/30/2017 Total lbs lost to date: 1 At least 15 minutes were spent on discussing the following behavioral intervention visit.   ASK: We discussed the diagnosis of obesity with [name] [name] today and [name] [name] agreed to give [place] permission to discuss obesity behavioral modification therapy today.  ASSESS: [name] [name] has the diagnosis of obesity and her BMI today is 51.93 [name] [name] is in the action stage of change   ADVISE: [name] [name] was educated on the multiple health risks of obesity as well as the benefit of weight loss to improve her health. She was advised of the need for long term treatment and the importance of lifestyle modifications to improve her current health and to decrease her risk of future health problems.  AGREE: Multiple dietary modification options and treatment options were discussed and  [name] [name] agreed to follow the recommendations documented in the above note.  ARRANGE: [name] [name] was educated on the importance of frequent visits to treat obesity as outlined per CMS and USPSTF guidelines and agreed to schedule her next follow up appointment today.  I, Trixie Dredge, am acting as transcriptionist for Dennard Nip, MD  I have reviewed the above documentation for accuracy and completeness, and I agree with the above. -Dennard Nip, MD

## 2017-08-31 LAB — COMPREHENSIVE METABOLIC PANEL
ALBUMIN: 4.3 g/dL (ref 3.5–5.5)
ALK PHOS: 78 IU/L (ref 39–117)
ALT: 20 IU/L (ref 0–32)
AST: 10 IU/L (ref 0–40)
Albumin/Globulin Ratio: 2.2 (ref 1.2–2.2)
BUN / CREAT RATIO: 15 (ref 9–23)
BUN: 19 mg/dL (ref 6–24)
Bilirubin Total: 0.4 mg/dL (ref 0.0–1.2)
CHLORIDE: 105 mmol/L (ref 96–106)
CO2: 20 mmol/L (ref 20–29)
Calcium: 9.4 mg/dL (ref 8.7–10.2)
Creatinine, Ser: 1.23 mg/dL — ABNORMAL HIGH (ref 0.57–1.00)
GFR calc Af Amer: 58 mL/min/{1.73_m2} — ABNORMAL LOW (ref >59)
GFR calc non Af Amer: 51 mL/min/{1.73_m2} — ABNORMAL LOW (ref >59)
GLOBULIN, TOTAL: 2 g/dL (ref 1.5–4.5)
Glucose: 185 mg/dL — ABNORMAL HIGH (ref 65–99)
POTASSIUM: 4.5 mmol/L (ref 3.5–5.2)
SODIUM: 141 mmol/L (ref 134–144)
Total Protein: 6.3 g/dL (ref 6.0–8.5)

## 2017-08-31 LAB — HEMOGLOBIN A1C
Est. average glucose Bld gHb Est-mCnc: 174 mg/dL
HEMOGLOBIN A1C: 7.7 % — AB (ref 4.8–5.6)

## 2017-08-31 LAB — VITAMIN D 25 HYDROXY (VIT D DEFICIENCY, FRACTURES): Vit D, 25-Hydroxy: 36.4 ng/mL (ref 30.0–100.0)

## 2017-09-12 ENCOUNTER — Emergency Department (HOSPITAL_BASED_OUTPATIENT_CLINIC_OR_DEPARTMENT_OTHER): Payer: BLUE CROSS/BLUE SHIELD

## 2017-09-12 ENCOUNTER — Emergency Department (HOSPITAL_BASED_OUTPATIENT_CLINIC_OR_DEPARTMENT_OTHER)
Admission: EM | Admit: 2017-09-12 | Discharge: 2017-09-13 | Disposition: A | Discharge status: Home / Self Care | Payer: BLUE CROSS/BLUE SHIELD | Attending: Emergency Medicine | Admitting: Emergency Medicine

## 2017-09-12 ENCOUNTER — Other Ambulatory Visit: Payer: Self-pay

## 2017-09-12 ENCOUNTER — Encounter (HOSPITAL_BASED_OUTPATIENT_CLINIC_OR_DEPARTMENT_OTHER): Payer: Self-pay

## 2017-09-12 DIAGNOSIS — I120 Hypertensive chronic kidney disease with stage 5 chronic kidney disease or end stage renal disease: Secondary | ICD-10-CM | POA: Diagnosis not present

## 2017-09-12 DIAGNOSIS — E1122 Type 2 diabetes mellitus with diabetic chronic kidney disease: Secondary | ICD-10-CM | POA: Diagnosis not present

## 2017-09-12 DIAGNOSIS — Z7982 Long term (current) use of aspirin: Secondary | ICD-10-CM | POA: Insufficient documentation

## 2017-09-12 DIAGNOSIS — N186 End stage renal disease: Secondary | ICD-10-CM | POA: Diagnosis not present

## 2017-09-12 DIAGNOSIS — Z7984 Long term (current) use of oral hypoglycemic drugs: Secondary | ICD-10-CM | POA: Insufficient documentation

## 2017-09-12 DIAGNOSIS — N390 Urinary tract infection, site not specified: Secondary | ICD-10-CM

## 2017-09-12 DIAGNOSIS — N3090 Cystitis, unspecified without hematuria: Secondary | ICD-10-CM | POA: Insufficient documentation

## 2017-09-12 DIAGNOSIS — R319 Hematuria, unspecified: Secondary | ICD-10-CM | POA: Diagnosis present

## 2017-09-12 DIAGNOSIS — N200 Calculus of kidney: Secondary | ICD-10-CM | POA: Diagnosis not present

## 2017-09-12 LAB — URINALYSIS, ROUTINE W REFLEX MICROSCOPIC
BILIRUBIN URINE: NEGATIVE
Glucose, UA: NEGATIVE mg/dL
Ketones, ur: NEGATIVE mg/dL
NITRITE: NEGATIVE
Protein, ur: 100 mg/dL — AB
Specific Gravity, Urine: <1.005 — ABNORMAL LOW (ref 1.005–1.030)
pH: 6 (ref 5.0–8.0)

## 2017-09-12 LAB — BASIC METABOLIC PANEL
Anion gap: 12 (ref 5–15)
BUN: 22 mg/dL — ABNORMAL HIGH (ref 6–20)
CALCIUM: 8.9 mg/dL (ref 8.9–10.3)
CO2: 24 mmol/L (ref 22–32)
CREATININE: 1.13 mg/dL — AB (ref 0.44–1.00)
Chloride: 106 mmol/L (ref 98–111)
GFR calc Af Amer: >60 mL/min (ref >60)
GFR calc non Af Amer: 55 mL/min — ABNORMAL LOW (ref >60)
GLUCOSE: 174 mg/dL — AB (ref 70–99)
Potassium: 4.1 mmol/L (ref 3.5–5.1)
Sodium: 142 mmol/L (ref 135–145)

## 2017-09-12 LAB — URINALYSIS, MICROSCOPIC (REFLEX)

## 2017-09-12 LAB — CBC
HEMATOCRIT: 39.5 % (ref 36.0–46.0)
Hemoglobin: 13.1 g/dL (ref 12.0–15.0)
MCH: 28.8 pg (ref 26.0–34.0)
MCHC: 33.2 g/dL (ref 30.0–36.0)
MCV: 86.8 fL (ref 78.0–100.0)
Platelets: 213 10*3/uL (ref 150–400)
RBC: 4.55 MIL/uL (ref 3.87–5.11)
RDW: 12.8 % (ref 11.5–15.5)
WBC: 10.5 10*3/uL (ref 4.0–10.5)

## 2017-09-12 MED ORDER — SODIUM CHLORIDE 0.9 % IV SOLN
1.0000 g | Freq: Once | INTRAVENOUS | Status: AC
  Administered 2017-09-12: 1 g via INTRAVENOUS
  Filled 2017-09-12: qty 10

## 2017-09-12 MED ORDER — KETOROLAC TROMETHAMINE 60 MG/2ML IM SOLN: 30.0000 mg | Freq: Once | INTRAMUSCULAR | Status: DC

## 2017-09-12 MED ORDER — FENTANYL CITRATE (PF) 100 MCG/2ML IJ SOLN
50.0000 ug | INTRAMUSCULAR | Status: DC | PRN
  Administered 2017-09-12: 50 ug via INTRAVENOUS
  Filled 2017-09-12: qty 2

## 2017-09-12 MED ORDER — CEFTRIAXONE SODIUM 1 G IJ SOLR: 1.0000 g | Freq: Once | INTRAMUSCULAR | Status: DC

## 2017-09-12 MED ORDER — ACETAMINOPHEN 325 MG PO TABS
650.0000 mg | ORAL_TABLET | Freq: Once | ORAL | Status: AC
  Administered 2017-09-12: 650 mg via ORAL
  Filled 2017-09-12: qty 2

## 2017-09-12 MED ORDER — ONDANSETRON HCL 4 MG/2ML IJ SOLN
4.0000 mg | Freq: Once | INTRAMUSCULAR | Status: AC
  Administered 2017-09-12: 4 mg via INTRAVENOUS
  Filled 2017-09-12: qty 2

## 2017-09-12 NOTE — ED Triage Notes (Signed)
C/o hematuria x today-slow gait-hyperventilating-encouraged to slow breathing

## 2017-09-12 NOTE — ED Notes (Signed)
Patient transported to CT 

## 2017-09-12 NOTE — ED Notes (Signed)
Pt returned from ct

## 2017-09-12 NOTE — ED Notes (Signed)
Pt ambulatory to restroom with visitor

## 2017-09-12 NOTE — ED Notes (Signed)
Pt c/o frequent urination that started this past weekend with painful urination that started today. Denies flank pain, denies fever. Had a kidney transplant in 2013, has one functioning kidney on right side.

## 2017-09-13 ENCOUNTER — Encounter (HOSPITAL_BASED_OUTPATIENT_CLINIC_OR_DEPARTMENT_OTHER): Payer: Self-pay | Admitting: Emergency Medicine

## 2017-09-13 ENCOUNTER — Ambulatory Visit (INDEPENDENT_AMBULATORY_CARE_PROVIDER_SITE_OTHER): Payer: BLUE CROSS/BLUE SHIELD | Admitting: Bariatrics

## 2017-09-13 VITALS — BP 118/80 | HR 75 | Temp 97.8°F | Ht 65.0 in | Wt 283.0 lb

## 2017-09-13 DIAGNOSIS — E119 Type 2 diabetes mellitus without complications: Secondary | ICD-10-CM

## 2017-09-13 DIAGNOSIS — E559 Vitamin D deficiency, unspecified: Secondary | ICD-10-CM

## 2017-09-13 DIAGNOSIS — Z6841 Body Mass Index (BMI) 40.0 and over, adult: Secondary | ICD-10-CM

## 2017-09-13 DIAGNOSIS — Z9189 Other specified personal risk factors, not elsewhere classified: Secondary | ICD-10-CM

## 2017-09-13 MED ORDER — CEPHALEXIN 500 MG PO CAPS: 500.0000 mg | ORAL_CAPSULE | Freq: Four times a day (QID) | ORAL | 0 refills | Status: DC

## 2017-09-13 NOTE — ED Provider Notes (Signed)
[facility] DEPARTMENT Provider Note   CSN: [id number] Arrival date & time: 09/12/17  2036     History   Chief Complaint Chief Complaint  Patient presents with  . Hematuria    HPI [name] [name] is a 52 y.o. female.  The history is provided by the patient.  Hematuria  This is a new problem. The current episode started 6 to 12 hours ago. The problem occurs constantly. The problem has not changed since onset.Pertinent negatives include no chest pain, no abdominal pain, no headaches and no shortness of breath. Nothing aggravates the symptoms. Nothing relieves the symptoms. She has tried nothing for the symptoms. The treatment provided no relief.  S/p Renal transplant many years ago with hematuria today.  No f/c/r.  No n/v/d.  No weakness.  No changes in appetite.  No pain.    Past Medical History:  Diagnosis Date  . Chronic kidney disease   . Diabetes mellitus without complication ([place])   . Eczema   . Enlarged kidney    bilateral  . Gout    previously  . Hyperlipidemia   . Hypertension   . Kidney transplanted   . Polycystic kidney disease   . Psoriasis     Patient Active Problem List   Diagnosis Date Noted  . Other fatigue 04/19/2017  . Shortness of breath on exertion 04/19/2017  . Polycystic kidney disease 04/19/2017  . Type 2 diabetes mellitus without complication, with long-term current use of insulin ([place]) 04/19/2017  . Incarcerated ventral hernia 08/04/2011  . Small bowel obstruction ([place]) 07/04/2011  . ESRD (end stage renal disease) on dialysis ([place]) 07/04/2011  . Abdominal pain 07/04/2011  . Preop cardiovascular exam 06/07/2011  . Renal insufficiency 06/07/2011  . Hyperlipidemia 06/07/2011    Past Surgical History:  Procedure Laterality Date  . ABDOMINAL HYSTERECTOMY  2000?   complete  . CATARACT EXTRACTION  2011   bilateral  . [place]  . HERNIA REPAIR  6962   umbilical hernia  . HERNIA REPAIR  2013  .  NEPHRECTOMY TRANSPLANTED ORGAN    . PERITONEAL CATHETER INSERTION    . VENTRAL HERNIA REPAIR  07/04/2011   Procedure: HERNIA REPAIR VENTRAL ADULT;  Surgeon: Imogene Burn. Georgette Dover, MD;  Location: [facility];  Service: General;  Laterality: N/A;     OB History    Gravida  2   Para  2   Term      Preterm      AB      Living  2     SAB      TAB      Ectopic      Multiple      Live Births               Home Medications    Prior to Admission medications   Medication Sig Start Date End Date Taking? Authorizing Provider  aspirin 325 MG tablet Take 325 mg by mouth daily.    [provider]  atorvastatin (LIPITOR) 40 MG tablet Take 40 mg by mouth at bedtime.  08/07/10   [provider]  cephALEXin (KEFLEX) 500 MG capsule Take 1 capsule (500 mg total) by mouth 4 (four) times daily. 09/13/17   Amarii Bordas, MD  glimepiride (AMARYL) 4 MG tablet Take 4 mg by mouth daily with breakfast.    [provider]  Insulin Degludec (TRESIBA) 100 UNIT/ML SOLN Inject 40 Units into the skin once.  [provider]  linagliptin (TRADJENTA) 5 MG TABS tablet Take 5 mg by mouth daily.    [provider]  mycophenolate (MYFORTIC) 360 MG TBEC EC tablet Take 720 mg by mouth 2 (two) times daily.    [provider]  PROGRAF 0.5 MG capsule Take 0.5 mg by mouth at bedtime. Take along with 1 mg tablet at qhs=1.5 mg 02/02/15   [provider]  tacrolimus (PROGRAF) 1 MG capsule Take 1-2 mg by mouth 2 (two) times daily. Take 2 tabs (2 mg) in the am and Take 1 tab (1 mg) along with 0.5 mg tab=1.5 mg at qhs.    [provider]  Vitamin D, Ergocalciferol, (DRISDOL) 50000 units CAPS capsule Take 1 capsule (50,000 Units total) by mouth every 7 (seven) days. 08/30/17   Starlyn Skeans, MD    Family History Family History  Problem Relation Age of Onset  . Hypertension Father   . Polycystic kidney disease Father        died before starting dialysis  was near ESRD  . Cerebral aneurysm Father        died age 59  . Stroke Father   . Cancer Sister 38       cancer that developed in the fatty tissues of the muscle  . Hypertension Brother   . Polycystic kidney disease Brother        transplant  7 years ago    Social History Social History   Tobacco Use  . Smoking status: Never Smoker  . Smokeless tobacco: Never Used  Substance Use Topics  . Alcohol use: No  . Drug use: No     Allergies   Tape and Vancomycin   Review of Systems Review of Systems  Constitutional: Negative for appetite change, chills and fever.  Eyes: Negative for visual disturbance.  Respiratory: Negative for shortness of breath.   Cardiovascular: Negative for chest pain, palpitations and leg swelling.  Gastrointestinal: Negative for abdominal pain.  Genitourinary: Positive for dysuria and hematuria. Negative for flank pain.  Musculoskeletal: Negative for back pain.  Neurological: Negative for headaches.  All other systems reviewed and are negative.    Physical Exam Updated Vital Signs BP (!) 145/82 (BP Location: Left Arm)   Pulse 88   Temp 98.3 F (36.8 C) (Oral)   Resp 18   Ht 5\' 5"  (1.651 m)   Wt 130.6 kg   SpO2 98%   BMI 47.93 kg/m   Physical Exam  Constitutional: She is oriented to person, place, and time. She appears well-developed and well-nourished. No distress.  HENT:  Head: Normocephalic and atraumatic.  Mouth/Throat: No oropharyngeal exudate.  Eyes: Pupils are equal, round, and reactive to light. Conjunctivae are normal.  Neck: Normal range of motion. Neck supple.  Cardiovascular: Normal rate, regular rhythm, normal heart sounds and intact distal pulses.  Pulmonary/Chest: Effort normal and breath sounds normal. No stridor. She has no wheezes. She has no rales.  Abdominal: Soft. Bowel sounds are normal. She exhibits no mass. There is no tenderness. There is no rebound and no guarding.  Musculoskeletal: Normal range of motion.    Neurological: She is alert and oriented to person, place, and time. She displays normal reflexes.  Skin: Skin is warm and dry. Capillary refill takes less than 2 seconds.  Psychiatric: She has a normal mood and affect.     ED Treatments / Results  Labs (all labs ordered are listed, but only abnormal results are displayed) Labs Reviewed  URINALYSIS, ROUTINE W REFLEX MICROSCOPIC - Abnormal; Notable for the following components:      Result Value   Color, Urine RED (*)    APPearance TURBID (*)    Specific Gravity, Urine <1.005 (*)    Hgb urine dipstick LARGE (*)    Protein, ur 100 (*)    Leukocytes, UA LARGE (*)    All other components within normal limits  URINALYSIS, MICROSCOPIC (REFLEX) - Abnormal; Notable for the following components:   Bacteria, UA FEW (*)    All other components within normal limits  BASIC METABOLIC PANEL - Abnormal; Notable for the following components:   Glucose, Bld 174 (*)    BUN 22 (*)    Creatinine, Ser 1.13 (*)    GFR calc non Af Amer 55 (*)    All other components within normal limits  URINE CULTURE  CBC    EKG None  Radiology Ct Renal Stone Study  Result Date: 09/12/2017 CLINICAL DATA:  Frequent urination starting this past weekend with dysuria starting today. EXAM: CT ABDOMEN AND PELVIS WITHOUT CONTRAST TECHNIQUE: Multidetector CT imaging of the abdomen and pelvis was performed following the standard protocol without IV contrast. COMPARISON:  02/10/2015 FINDINGS: Lower chest: Top-normal heart size with dependent atelectasis. Hepatobiliary: Hepatic steatosis with stable low attenuating lesions scattered throughout the liver compatible with cysts or hemangiomata. Further evaluation limited by lack of IV contrast. No biliary dilatation. Pancreas: Normal Spleen: Normal Adrenals/Urinary Tract: Bilateral nephrectomy with renal transplant in the right hemipelvis. Punctate nonobstructing lower pole calculus identified within the transplanted kidney.  Chronic mild fullness are ectasias of the transplanted renal collecting system. The urinary bladder is unremarkable and stable in appearance without focal mural thickening or calculus. Surgical clip is seen interposed between the renal transplant kidney and dome of the bladder. Stomach/Bowel: Colonic diverticulosis without acute diverticulitis. Small hiatal hernia. Physiologic distention of the stomach. Normal small bowel rotation without obstruction or inflammation. Normal-appearing appendix. Vascular/Lymphatic: Nonaneurysmal aorta.  No adenopathy. Reproductive: Hysterectomy.  No adnexal mass. Other: Fat containing ventral hernias, stable in appearance. Musculoskeletal: No acute osseous abnormality nor aggressive osseous lesions. IMPRESSION: 1. Redemonstration of a right-sided renal transplant status post bilateral nephrectomy. Fullness of the renal pelvis of the transplanted kidney is redemonstrated without obstructive uropathy. The urinary bladder is not thick-walled in appearance and no calculus is seen within. 2. A nonobstructing lower pole calcification within the transplanted kidney is identified measuring approximately 3 mm. 3. Stable hepatic steatosis and low attenuating lesions scattered throughout the liver compatible with cysts or hemangiomata. 4. Stable fat containing bilobed ventral hernia. Electronically Signed   By: Ashley Royalty M.D.   On: 09/12/2017 23:38    Procedures Procedures (including critical care time)  Medications Ordered in ED Medications  fentaNYL (SUBLIMAZE) injection 50 mcg (50 mcg Intravenous Given 09/12/17 2151)  ondansetron (ZOFRAN) injection 4 mg (4 mg Intravenous Given 09/12/17 2151)  acetaminophen (TYLENOL) tablet 650 mg (650 mg Oral Given 09/12/17 2311)  cefTRIAXone (ROCEPHIN) 1 g in sodium chloride 0.9 % 100 mL IVPB (0 g Intravenous Stopped 09/12/17 2334)       Final Clinical Impressions(s) / ED Diagnoses   Final diagnoses:  Cystitis  Lower urinary tract infectious  disease   Return for fevers >100.4 unrelieved by medication, shortness of breath, intractable vomiting, or diarrhea, Inability to tolerate liquids or food, cough, altered mental status or any concerns. No signs of systemic illness or infection. The patient is nontoxic-appearing on exam and vital signs are within normal  limits.   I have reviewed the triage vital signs and the nursing notes. Pertinent labs &imaging results that were available during my care of the patient were reviewed by me and considered in my medical decision making (see chart for details).  After history, exam, and medical workup I feel the patient has been appropriately medically screened and is safe for discharge home. Pertinent diagnoses were discussed with the patient. Patient was given return precautions. ED Discharge Orders         Ordered    cephALEXin (KEFLEX) 500 MG capsule  4 times daily     09/13/17 0041           Adyson Vanburen, MD 09/13/17 7672

## 2017-09-13 NOTE — Progress Notes (Signed)
Office: [phone]  /  Fax: [phone]   HPI:   Chief Complaint: OBESITY [name] [name] is here to discuss her progress with her obesity treatment plan. She is on the portion control better and make smarter food choices, such as increase vegetables and decrease simple carbohydrates and is following her eating plan approximately 50-60 % of the time. She states she is walking for 15-20 minutes 3 times per week. Atiyah has been struggling, no defined plan and she has been hospitalized for urinary tract infection. She has a history of polycystic kidney disease and kidney transplant. She denies any snacking issues or hunger issues.  Her weight is 283 lb (128.4 kg) today and has gained 1 pound since her last visit. She has lost 0 lbs since starting treatment with [place].  Vitamin D Deficiency [name] has a diagnosis of vitamin D deficiency. She is currently taking prescription Vit D. She notes fatigue and denies nausea, vomiting or muscle weakness.  At risk for osteopenia and osteoporosis [name] is at higher risk of osteopenia and osteoporosis due to vitamin D deficiency.   Diabetes II Chemere has a diagnosis of diabetes type II. [name] is on glimepiride, Tradjenta, and Tresiba. She states fasting BGs range between 100 and 150's and 2 hour post prandial range between 100 and 130. She denies any hypoglycemic episodes. Last A1c was 7.7. She has been working on intensive lifestyle modifications including diet, exercise, and weight loss to help control her blood glucose levels.  ALLERGIES: Allergies  Allergen Reactions  . Tape   . Vancomycin     MEDICATIONS: Current Outpatient Medications on File Prior to Visit  Medication Sig Dispense Refill  . aspirin 325 MG tablet Take 325 mg by mouth daily.    Marland Kitchen atorvastatin (LIPITOR) 40 MG tablet Take 40 mg by mouth at bedtime.     . cephALEXin (KEFLEX) 500 MG capsule Take 1 capsule (500 mg total) by mouth 4 (four) times daily. 40 capsule 0  . glimepiride (AMARYL) 4  MG tablet Take 4 mg by mouth daily with breakfast.    . Insulin Degludec (TRESIBA) 100 UNIT/ML SOLN Inject 40 Units into the skin once.     . linagliptin (TRADJENTA) 5 MG TABS tablet Take 5 mg by mouth daily.    . mycophenolate (MYFORTIC) 360 MG TBEC EC tablet Take 720 mg by mouth 2 (two) times daily.    Marland Kitchen PROGRAF 0.5 MG capsule Take 0.5 mg by mouth at bedtime. Take along with 1 mg tablet at qhs=1.5 mg  5  . tacrolimus (PROGRAF) 1 MG capsule Take 1-2 mg by mouth 2 (two) times daily. Take 2 tabs (2 mg) in the am and Take 1 tab (1 mg) along with 0.5 mg tab=1.5 mg at qhs.    . Vitamin D, Ergocalciferol, (DRISDOL) 50000 units CAPS capsule Take 1 capsule (50,000 Units total) by mouth every 7 (seven) days. 12 capsule 0   No current facility-administered medications on file prior to visit.     PAST MEDICAL HISTORY: Past Medical History:  Diagnosis Date  . Chronic kidney disease   . Diabetes mellitus without complication ([place])   . Eczema   . Enlarged kidney    bilateral  . Gout    previously  . Hyperlipidemia   . Hypertension   . Kidney transplanted   . Polycystic kidney disease   . Psoriasis     PAST SURGICAL HISTORY: Past Surgical History:  Procedure Laterality Date  . ABDOMINAL HYSTERECTOMY  2000?   complete  .  CATARACT EXTRACTION  2011   bilateral  . [place]  . HERNIA REPAIR  7035   umbilical hernia  . HERNIA REPAIR  2013  . NEPHRECTOMY TRANSPLANTED ORGAN    . PERITONEAL CATHETER INSERTION    . VENTRAL HERNIA REPAIR  07/04/2011   Procedure: HERNIA REPAIR VENTRAL ADULT;  Surgeon: Imogene Burn. Georgette Dover, MD;  Location: [facility];  Service: General;  Laterality: N/A;    SOCIAL HISTORY: Social History   Tobacco Use  . Smoking status: Never Smoker  . Smokeless tobacco: Never Used  Substance Use Topics  . Alcohol use: No  . Drug use: No    FAMILY HISTORY: Family History  Problem Relation Age of Onset  . Hypertension Father   . Polycystic kidney disease  Father        died before starting dialysis was near ESRD  . Cerebral aneurysm Father        died age 72  . Stroke Father   . Cancer Sister 72       cancer that developed in the fatty tissues of the muscle  . Hypertension Brother   . Polycystic kidney disease Brother        transplant  7 years ago    ROS: Review of Systems  Constitutional: Positive for malaise/fatigue. Negative for weight loss.  Gastrointestinal: Negative for nausea and vomiting.  Musculoskeletal:       Negative muscle weakness  Endo/Heme/Allergies:       Negative hypoglycemia    PHYSICAL EXAM: Blood pressure 118/80, pulse 75, temperature 97.8 F (36.6 C), temperature source Oral, height 5\' 5"  (1.651 m), weight 283 lb (128.4 kg), SpO2 97 %. Body mass index is 47.09 kg/m. Physical Exam  Constitutional: She is oriented to person, place, and time. She appears well-developed and well-nourished.  Cardiovascular: Normal rate.  Pulmonary/Chest: Effort normal.  Musculoskeletal: Normal range of motion.  Neurological: She is oriented to person, place, and time.  Skin: Skin is warm and dry.  Psychiatric: She has a normal mood and affect. Her behavior is normal.  Vitals reviewed.   RECENT LABS AND TESTS: BMET    Component Value Date/Time   NA 142 09/12/2017 2150   NA 141 08/30/2017 0806   K 4.1 09/12/2017 2150   CL 106 09/12/2017 2150   CO2 24 09/12/2017 2150   GLUCOSE 174 (H) 09/12/2017 2150   BUN 22 (H) 09/12/2017 2150   BUN 19 08/30/2017 0806   CREATININE 1.13 (H) 09/12/2017 2150   CALCIUM 8.9 09/12/2017 2150   GFRNONAA 55 (L) 09/12/2017 2150   GFRAA >60 09/12/2017 2150   Lab Results  Component Value Date   HGBA1C 7.7 (H) 08/30/2017   HGBA1C 7.7 (H) 04/19/2017   Lab Results  Component Value Date   INSULIN 26.1 (H) 04/19/2017   CBC    Component Value Date/Time   WBC 10.5 09/12/2017 2150   RBC 4.55 09/12/2017 2150   HGB 13.1 09/12/2017 2150   HGB 13.0 04/19/2017 0934   HCT 39.5 09/12/2017  2150   HCT 39.9 04/19/2017 0934   PLT 213 09/12/2017 2150   MCV 86.8 09/12/2017 2150   MCV 87 04/19/2017 0934   MCH 28.8 09/12/2017 2150   MCHC 33.2 09/12/2017 2150   RDW 12.8 09/12/2017 2150   RDW 14.1 04/19/2017 0934   LYMPHSABS 1.0 04/19/2017 0934   MONOABS 0.6 07/03/2011 1807   EOSABS 0.2 04/19/2017 0934   BASOSABS 0.0 04/19/2017 0934   Iron/TIBC/Ferritin/ %Sat No  results found for: IRON, TIBC, FERRITIN, IRONPCTSAT Lipid Panel     Component Value Date/Time   CHOL 115 04/19/2017 0934   TRIG 63 04/19/2017 0934   HDL 46 04/19/2017 0934   LDLCALC 56 04/19/2017 0934   Hepatic Function Panel     Component Value Date/Time   PROT 6.3 08/30/2017 0806   ALBUMIN 4.3 08/30/2017 0806   AST 10 08/30/2017 0806   ALT 20 08/30/2017 0806   ALKPHOS 78 08/30/2017 0806   BILITOT 0.4 08/30/2017 0806   BILIDIR 0.2 02/10/2015 1625   IBILI 0.5 02/10/2015 1625      Component Value Date/Time   TSH 2.080 04/19/2017 0934  Results for YARIANNA, VARBLE (MRN [id number]) as of 09/13/2017 11:30  Ref. Range 08/30/2017 08:06  Vitamin D, 25-Hydroxy Latest Ref Range: 30.0 - 100.0 ng/mL 36.4    ASSESSMENT AND PLAN: Vitamin D deficiency  Type 2 diabetes mellitus without complication, without long-term current use of insulin (HCC)  At risk for osteoporosis  Class 3 severe obesity with serious comorbidity and body mass index (BMI) of 45.0 to 49.9 in adult, unspecified obesity type ([place])  PLAN:  Vitamin D Deficiency Jaylenn was informed that low vitamin D levels contributes to fatigue and are associated with obesity, breast, and colon cancer. Laylonie agrees to continue taking prescription Vit D @50 ,000 IU every week and will follow up for routine testing of vitamin D, at least 2-3 times per year. She was informed of the risk of over-replacement of vitamin D and agrees to not increase her dose unless she discusses this with [place] first. Aiman agrees to follow up with our clinic in 2 weeks with myself or Dr.  Leafy Ro.  At risk for osteopenia and osteoporosis Xinyi was given extended (15 minutes) osteoporosis prevention counseling today. Satoria is at risk for osteopenia and osteoporsis due to her vitamin D deficiency. She was encouraged to take her vitamin D and follow her higher calcium diet and increase strengthening exercise to help strengthen her bones and decrease her risk of osteopenia and osteoporosis.  Diabetes II Shaquitta has been given extensive diabetes education by myself today including ideal fasting and post-prandial blood glucose readings, individual ideal Hgb A1c goals and hypoglycemia prevention. We discussed the importance of good blood sugar control to decrease the likelihood of diabetic complications such as nephropathy, neuropathy, limb loss, blindness, coronary artery disease, and death. We discussed the importance of intensive lifestyle modification including diet, exercise and weight loss as the first line treatment for diabetes. Lamerle agrees to continue her diabetes medications and will continue to monitor blood sugars, and decrease refined carbohydrates. She will begin walking 10-15 minutes every other day. Vonette agrees to follow up with our clinic in 2 weeks with myself or Dr. Leafy Ro.  Obesity Ichelle is currently in the action stage of change. As such, her goal is to continue with weight loss efforts She has agreed to portion control better and make smarter food choices, such as increase vegetables, decrease simple carbohydrates with 30 grams of protein in the morning. [place] has been instructed to work up to a goal of 150 minutes of combined cardio and strengthening exercise per week or some walking for weight loss and overall health benefits. We discussed the following Behavioral Modification Strategies today: increasing lean protein intake, decreasing simple carbohydrates, work on meal planning and easy cooking plans, increase H20 intake, and no skipping meals Tiffny will increase  protein, will use protein shakes for backup only, increase water, and focus on  lean meats and vegetables.  [place] has agreed to follow up with our clinic in 2 weeks with myself or Dr. Leafy Ro. She was informed of the importance of frequent follow up visits to maximize her success with intensive lifestyle modifications for her multiple health conditions.   OBESITY BEHAVIORAL INTERVENTION VISIT  Today's visit was # 7   Starting weight: 283 lbs Starting date: 04/19/17 Today's weight : 283 lbs  Today's date: 09/13/2017 Total lbs lost to date: 0 We discussed the following behavioral interventions at her visit.   ASK: We discussed the diagnosis of obesity with [name] today and Verdis Frederickson agreed to give [place] permission to discuss obesity behavioral modification therapy today.  ASSESS: Shamarra has the diagnosis of obesity and her BMI today is 1.09 [place] is in the action stage of change   ADVISE: [place] was educated on the multiple health risks of obesity as well as the benefit of weight loss to improve her health. She was advised of the need for long term treatment and the importance of lifestyle modifications to improve her current health and to decrease her risk of future health problems.  AGREE: Multiple dietary modification options and treatment options were discussed and  Lorna agreed to follow the recommendations documented in the above note.  ARRANGE: Giselle was educated on the importance of frequent visits to treat obesity as outlined per CMS and USPSTF guidelines and agreed to schedule her next follow up appointment today.  Wilhemena Durie, am acting as transcriptionist for [employer], DO  I have reviewed the above documentation for accuracy and completeness, and I agree with the above. -Jearld Lesch, DO

## 2017-09-13 NOTE — ED Notes (Signed)
Pt verbalizes understanding of d/c instructions and denies any further need at this time. 

## 2017-09-14 ENCOUNTER — Encounter (INDEPENDENT_AMBULATORY_CARE_PROVIDER_SITE_OTHER): Payer: Self-pay | Admitting: Bariatrics

## 2017-09-14 LAB — URINE CULTURE

## 2017-09-18 ENCOUNTER — Ambulatory Visit (INDEPENDENT_AMBULATORY_CARE_PROVIDER_SITE_OTHER): Payer: BLUE CROSS/BLUE SHIELD | Admitting: Bariatrics

## 2017-09-22 ENCOUNTER — Ambulatory Visit
Admission: RE | Admit: 2017-09-22 | Discharge: 2017-09-22 | Disposition: A | Discharge status: Home / Self Care | Payer: BLUE CROSS/BLUE SHIELD | Source: Ambulatory Visit | Attending: Family Medicine | Admitting: Family Medicine

## 2017-09-22 DIAGNOSIS — Z1231 Encounter for screening mammogram for malignant neoplasm of breast: Secondary | ICD-10-CM

## 2017-09-26 ENCOUNTER — Other Ambulatory Visit: Payer: Self-pay | Admitting: Family Medicine

## 2017-09-26 DIAGNOSIS — Z940 Kidney transplant status: Secondary | ICD-10-CM | POA: Diagnosis not present

## 2017-09-26 DIAGNOSIS — R928 Other abnormal and inconclusive findings on diagnostic imaging of breast: Secondary | ICD-10-CM

## 2017-09-26 DIAGNOSIS — M109 Gout, unspecified: Secondary | ICD-10-CM | POA: Diagnosis not present

## 2017-09-26 DIAGNOSIS — I129 Hypertensive chronic kidney disease with stage 1 through stage 4 chronic kidney disease, or unspecified chronic kidney disease: Secondary | ICD-10-CM | POA: Diagnosis not present

## 2017-09-27 ENCOUNTER — Ambulatory Visit (INDEPENDENT_AMBULATORY_CARE_PROVIDER_SITE_OTHER): Payer: BLUE CROSS/BLUE SHIELD | Admitting: Family Medicine

## 2017-09-27 VITALS — BP 123/83 | HR 81 | Temp 97.6°F | Ht 65.0 in | Wt 283.0 lb

## 2017-09-27 DIAGNOSIS — E1165 Type 2 diabetes mellitus with hyperglycemia: Secondary | ICD-10-CM

## 2017-09-27 DIAGNOSIS — Z6841 Body Mass Index (BMI) 40.0 and over, adult: Secondary | ICD-10-CM | POA: Diagnosis not present

## 2017-09-27 DIAGNOSIS — Z794 Long term (current) use of insulin: Secondary | ICD-10-CM

## 2017-09-27 DIAGNOSIS — Z0289 Encounter for other administrative examinations: Secondary | ICD-10-CM

## 2017-10-02 NOTE — Progress Notes (Signed)
Office: [phone]  /  Fax: [phone]   HPI:   Chief Complaint: OBESITY [name] [name] is here to discuss her progress with her obesity treatment plan. She is on the portion control better and make smarter food choices, such as increase vegetables and decrease simple carbohydrates and is following her eating plan approximately 60-65 % of the time. She states she is walking for 15-20 minutes 3 times per week. [name] [name] continues to maintain weight. She is working on Research scientist (life sciences) but she is not able to follow a structured plan. She often skips lunch and is getting sabotage at home from her husband. She notes increased PM snacking.  Her weight is 283 lb (128.4 kg) today and has not lost weight since her last visit. She has lost 0 lbs since starting treatment with [place].  Diabetes II on Insulin with Hyperglycemia [name] [name] has a diagnosis of diabetes type II. [name] [name] denies hypoglycemia and she states fasting BGs range in 160's. She did not bring in BGs log today. Last A1c was 7.7 and not controlled. She has been working on intensive lifestyle modifications including diet, exercise, and weight loss to help control her blood glucose levels.  ALLERGIES: Allergies  Allergen Reactions  . Tape   . Vancomycin     MEDICATIONS: Current Outpatient Medications on File Prior to Visit  Medication Sig Dispense Refill  . aspirin 325 MG tablet Take 325 mg by mouth daily.    Marland Kitchen atorvastatin (LIPITOR) 40 MG tablet Take 40 mg by mouth at bedtime.     Marland Kitchen glimepiride (AMARYL) 4 MG tablet Take 4 mg by mouth daily with breakfast.    . Insulin Degludec (TRESIBA) 100 UNIT/ML SOLN Inject 40 Units into the skin once.     . linagliptin (TRADJENTA) 5 MG TABS tablet Take 5 mg by mouth daily.    . mycophenolate (MYFORTIC) 360 MG TBEC EC tablet Take 720 mg by mouth 2 (two) times daily.    Marland Kitchen PROGRAF 0.5 MG capsule Take 0.5 mg by mouth at bedtime. Take along with 1 mg tablet at qhs=1.5 mg  5  . tacrolimus  (PROGRAF) 1 MG capsule Take 1-2 mg by mouth 2 (two) times daily. Take 2 tabs (2 mg) in the am and Take 1 tab (1 mg) along with 0.5 mg tab=1.5 mg at qhs.    . Vitamin D, Ergocalciferol, (DRISDOL) 50000 units CAPS capsule Take 1 capsule (50,000 Units total) by mouth every 7 (seven) days. 12 capsule 0   No current facility-administered medications on file prior to visit.     PAST MEDICAL HISTORY: Past Medical History:  Diagnosis Date  . Chronic kidney disease   . Diabetes mellitus without complication ([place])   . Eczema   . Enlarged kidney    bilateral  . Gout    previously  . Hyperlipidemia   . Hypertension   . Kidney transplanted   . Polycystic kidney disease   . Psoriasis     PAST SURGICAL HISTORY: Past Surgical History:  Procedure Laterality Date  . ABDOMINAL HYSTERECTOMY  2000?   complete  . CATARACT EXTRACTION  2011   bilateral  . [place]  . HERNIA REPAIR  7564   umbilical hernia  . HERNIA REPAIR  2013  . NEPHRECTOMY TRANSPLANTED ORGAN    . PERITONEAL CATHETER INSERTION    . VENTRAL HERNIA REPAIR  07/04/2011   Procedure: HERNIA REPAIR VENTRAL ADULT;  Surgeon: Imogene Burn. Georgette Dover, MD;  Location: [facility]  OR;  Service: General;  Laterality: N/A;    SOCIAL HISTORY: Social History   Tobacco Use  . Smoking status: Never Smoker  . Smokeless tobacco: Never Used  Substance Use Topics  . Alcohol use: No  . Drug use: No    FAMILY HISTORY: Family History  Problem Relation Age of Onset  . Hypertension Father   . Polycystic kidney disease Father        died before starting dialysis was near ESRD  . Cerebral aneurysm Father        died age 56  . Stroke Father   . Cancer Sister 74       cancer that developed in the fatty tissues of the muscle  . Hypertension Brother   . Polycystic kidney disease Brother        transplant  7 years ago    ROS: Review of Systems  Constitutional: Negative for weight loss.  Endo/Heme/Allergies:       Negative  hypoglycemia    PHYSICAL EXAM: Blood pressure 123/83, pulse 81, temperature 97.6 F (36.4 C), temperature source Oral, height 5\' 5"  (1.651 m), weight 283 lb (128.4 kg), SpO2 97 %. Body mass index is 47.09 kg/m. Physical Exam  Constitutional: She is oriented to person, place, and time. She appears well-developed and well-nourished.  Cardiovascular: Normal rate.  Pulmonary/Chest: Effort normal.  Musculoskeletal: Normal range of motion.  Neurological: She is oriented to person, place, and time.  Skin: Skin is warm and dry.  Psychiatric: She has a normal mood and affect. Her behavior is normal.  Vitals reviewed.   RECENT LABS AND TESTS: BMET    Component Value Date/Time   NA 142 09/12/2017 2150   NA 141 08/30/2017 0806   K 4.1 09/12/2017 2150   CL 106 09/12/2017 2150   CO2 24 09/12/2017 2150   GLUCOSE 174 (H) 09/12/2017 2150   BUN 22 (H) 09/12/2017 2150   BUN 19 08/30/2017 0806   CREATININE 1.13 (H) 09/12/2017 2150   CALCIUM 8.9 09/12/2017 2150   GFRNONAA 55 (L) 09/12/2017 2150   GFRAA >60 09/12/2017 2150   Lab Results  Component Value Date   HGBA1C 7.7 (H) 08/30/2017   HGBA1C 7.7 (H) 04/19/2017   Lab Results  Component Value Date   INSULIN 26.1 (H) 04/19/2017   CBC    Component Value Date/Time   WBC 10.5 09/12/2017 2150   RBC 4.55 09/12/2017 2150   HGB 13.1 09/12/2017 2150   HGB 13.0 04/19/2017 0934   HCT 39.5 09/12/2017 2150   HCT 39.9 04/19/2017 0934   PLT 213 09/12/2017 2150   MCV 86.8 09/12/2017 2150   MCV 87 04/19/2017 0934   MCH 28.8 09/12/2017 2150   MCHC 33.2 09/12/2017 2150   RDW 12.8 09/12/2017 2150   RDW 14.1 04/19/2017 0934   LYMPHSABS 1.0 04/19/2017 0934   MONOABS 0.6 07/03/2011 1807   EOSABS 0.2 04/19/2017 0934   BASOSABS 0.0 04/19/2017 0934   Iron/TIBC/Ferritin/ %Sat No results found for: IRON, TIBC, FERRITIN, IRONPCTSAT Lipid Panel     Component Value Date/Time   CHOL 115 04/19/2017 0934   TRIG 63 04/19/2017 0934   HDL 46  04/19/2017 0934   LDLCALC 56 04/19/2017 0934   Hepatic Function Panel     Component Value Date/Time   PROT 6.3 08/30/2017 0806   ALBUMIN 4.3 08/30/2017 0806   AST 10 08/30/2017 0806   ALT 20 08/30/2017 0806   ALKPHOS 78 08/30/2017 0806   BILITOT 0.4 08/30/2017 0806  BILIDIR 0.2 02/10/2015 1625   IBILI 0.5 02/10/2015 1625      Component Value Date/Time   TSH 2.080 04/19/2017 0934    ASSESSMENT AND PLAN: Type 2 diabetes mellitus with hyperglycemia, with long-term current use of insulin (HCC)  Class 3 severe obesity with serious comorbidity and body mass index (BMI) of 45.0 to 49.9 in adult, unspecified obesity type ([place])  PLAN:  Diabetes II on Insulin with Hyperglycemia [name] [name] has been given extensive diabetes education by myself today including ideal fasting and post-prandial blood glucose readings, individual ideal Hgb A1c goals and hypoglycemia prevention. We discussed the importance of good blood sugar control to decrease the likelihood of diabetic complications such as nephropathy, neuropathy, limb loss, blindness, coronary artery disease, and death. We discussed the importance of intensive lifestyle modification including diet, exercise and weight loss as the first line treatment for diabetes. [name] [name] agrees to continue her current diabetes medications and she will work on getting back to proper eating. [name] [name] agrees to follow up with our clinic in 3 to 4 weeks.  I spent > than 50% of the 15 minute visit on counseling as documented in the note.  Obesity [name] [name] is currently in the action stage of change. As such, her goal is to continue with weight loss efforts She has agreed to keep a food journal with 1200 calories and 70+ grams of protein daily [name] [name] has been instructed to work up to a goal of 150 minutes of combined cardio and strengthening exercise per week for weight loss and overall health benefits. We discussed the following Behavioral Modification Strategies today:  increasing lean protein intake, increasing vegetables, and keep a strict food journal   [name] [name] has agreed to follow up with our clinic in 3 to 4 weeks. She was informed of the importance of frequent follow up visits to maximize her success with intensive lifestyle modifications for her multiple health conditions.   OBESITY BEHAVIORAL INTERVENTION VISIT  Today's visit was # 8   Starting weight: 283 lbs Starting date: 04/19/17 Today's weight : 283 lbs  Today's date: 09/27/2017 Total lbs lost to date: 0    ASK: We discussed the diagnosis of obesity with [name] [name] today and [name] [name] agreed to give [place] permission to discuss obesity behavioral modification therapy today.  ASSESS: [name] [name] has the diagnosis of obesity and her BMI today is 37.09 [name] [name] is in the action stage of change   ADVISE: [name] [name] was educated on the multiple health risks of obesity as well as the benefit of weight loss to improve her health. She was advised of the need for long term treatment and the importance of lifestyle modifications to improve her current health and to decrease her risk of future health problems.  AGREE: Multiple dietary modification options and treatment options were discussed and  [name] [name] agreed to follow the recommendations documented in the above note.  ARRANGE: [name] [name] was educated on the importance of frequent visits to treat obesity as outlined per CMS and USPSTF guidelines and agreed to schedule her next follow up appointment today.  I, Trixie Dredge, am acting as transcriptionist for Dennard Nip, MD  I have reviewed the above documentation for accuracy and completeness, and I agree with the above. -Dennard Nip, MD

## 2017-10-03 ENCOUNTER — Other Ambulatory Visit: Payer: Self-pay | Admitting: Gastroenterology

## 2017-10-03 DIAGNOSIS — Z01818 Encounter for other preprocedural examination: Secondary | ICD-10-CM | POA: Diagnosis not present

## 2017-10-04 ENCOUNTER — Other Ambulatory Visit: Payer: Self-pay | Admitting: Family Medicine

## 2017-10-04 ENCOUNTER — Ambulatory Visit
Admission: RE | Admit: 2017-10-04 | Discharge: 2017-10-04 | Disposition: A | Discharge status: Home / Self Care | Payer: BLUE CROSS/BLUE SHIELD | Source: Ambulatory Visit | Attending: Family Medicine | Admitting: Family Medicine

## 2017-10-04 DIAGNOSIS — R928 Other abnormal and inconclusive findings on diagnostic imaging of breast: Secondary | ICD-10-CM

## 2017-10-04 DIAGNOSIS — N6310 Unspecified lump in the right breast, unspecified quadrant: Secondary | ICD-10-CM

## 2017-10-11 ENCOUNTER — Other Ambulatory Visit: Payer: BLUE CROSS/BLUE SHIELD

## 2017-10-11 ENCOUNTER — Ambulatory Visit: Payer: BLUE CROSS/BLUE SHIELD

## 2017-10-12 DIAGNOSIS — Z9483 Pancreas transplant status: Secondary | ICD-10-CM | POA: Diagnosis not present

## 2017-10-12 DIAGNOSIS — Z418 Encounter for other procedures for purposes other than remedying health state: Secondary | ICD-10-CM | POA: Diagnosis not present

## 2017-10-12 DIAGNOSIS — Z940 Kidney transplant status: Secondary | ICD-10-CM | POA: Diagnosis not present

## 2017-10-12 DIAGNOSIS — Z48298 Encounter for aftercare following other organ transplant: Secondary | ICD-10-CM | POA: Diagnosis not present

## 2017-10-16 ENCOUNTER — Ambulatory Visit
Admission: RE | Admit: 2017-10-16 | Discharge: 2017-10-16 | Disposition: A | Discharge status: Home / Self Care | Payer: BLUE CROSS/BLUE SHIELD | Source: Ambulatory Visit | Attending: Family Medicine | Admitting: Family Medicine

## 2017-10-16 DIAGNOSIS — Z780 Asymptomatic menopausal state: Secondary | ICD-10-CM | POA: Diagnosis not present

## 2017-10-16 DIAGNOSIS — E2839 Other primary ovarian failure: Secondary | ICD-10-CM

## 2017-10-16 DIAGNOSIS — Z13820 Encounter for screening for osteoporosis: Secondary | ICD-10-CM | POA: Diagnosis not present

## 2017-10-18 ENCOUNTER — Ambulatory Visit (INDEPENDENT_AMBULATORY_CARE_PROVIDER_SITE_OTHER): Payer: BLUE CROSS/BLUE SHIELD | Admitting: Family Medicine

## 2017-10-18 VITALS — BP 133/84 | HR 80 | Temp 97.6°F | Ht 65.0 in | Wt 285.0 lb

## 2017-10-18 DIAGNOSIS — E559 Vitamin D deficiency, unspecified: Secondary | ICD-10-CM | POA: Diagnosis not present

## 2017-10-18 DIAGNOSIS — Z9189 Other specified personal risk factors, not elsewhere classified: Secondary | ICD-10-CM | POA: Diagnosis not present

## 2017-10-18 DIAGNOSIS — R0602 Shortness of breath: Secondary | ICD-10-CM

## 2017-10-18 DIAGNOSIS — R5383 Other fatigue: Secondary | ICD-10-CM | POA: Diagnosis not present

## 2017-10-18 DIAGNOSIS — Z6841 Body Mass Index (BMI) 40.0 and over, adult: Secondary | ICD-10-CM

## 2017-10-18 DIAGNOSIS — E119 Type 2 diabetes mellitus without complications: Secondary | ICD-10-CM

## 2017-10-18 DIAGNOSIS — N289 Disorder of kidney and ureter, unspecified: Secondary | ICD-10-CM

## 2017-10-19 NOTE — Progress Notes (Signed)
Office: [phone]  /  Fax: [phone]   HPI:   Chief Complaint: OBESITY [name] [name] is here to discuss her progress with her obesity treatment plan. She is on the portion control better and make smarter food choices, such as increase vegetables and decrease simple carbohydrates and is following her eating plan approximately 60 % of the time. She states she is exercising 0 minutes 0 times per week. [name] [name] is frustrated because she has gained weight. She admits nausea and denies hunger. She is not drinking enough water. She skips meals often. She does not feel well on Category 2 and does not have time to journal. She reports that nephrology has told her to have 6 to 8 ounces of protein per day. She is status post renal transplant.  Her weight is 285 lb (129.3 kg) today and had a weight gain of 2 pounds since her last visit. She has lost 0 lbs since starting treatment with [place].  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  Vitamin D deficiency [name] [name] has a diagnosis of vitamin D deficiency. She is currently taking vit D which is stable, but very low. She admits nausea and fatigue, but denies vomiting or muscle weakness.  At risk for osteopenia and osteoporosis [name] [name] is at higher risk of osteopenia and osteoporosis due to vitamin D deficiency.   Fatigue and Shortness of breath [name] [name] feels her energy is lower than it should be. This has worsened with weight gain. [name] [name] has gained weight since starting the program. Her 1st RMR was 1549 on 04/19/17 and today's value was 2222.  Diabetes II [name] [name] has a diagnosis of diabetes type II. Her last A1c was 7.7 on 08/30/17 and was also 7.7 on 04/19/17 and she is not at goal.   ALLERGIES: Allergies  Allergen Reactions  . Tape   . Vancomycin     MEDICATIONS: Current Outpatient Medications on File Prior to Visit  Medication Sig Dispense Refill  . aspirin 325 MG tablet Take 325 mg by mouth daily.    Marland Kitchen  atorvastatin (LIPITOR) 40 MG tablet Take 40 mg by mouth at bedtime.     Marland Kitchen glimepiride (AMARYL) 4 MG tablet Take 4 mg by mouth daily with breakfast.    . Insulin Degludec (TRESIBA) 100 UNIT/ML SOLN Inject 40 Units into the skin once.     . linagliptin (TRADJENTA) 5 MG TABS tablet Take 5 mg by mouth daily.    . mycophenolate (MYFORTIC) 360 MG TBEC EC tablet Take 720 mg by mouth 2 (two) times daily.    . tacrolimus (PROGRAF) 1 MG capsule Take 1-2 mg by mouth 2 (two) times daily. Take 2 tabs (2 mg) in the am and Take 1 tab (1 mg) along with 0.5 mg tab=1.5 mg at qhs.    . Vitamin D, Ergocalciferol, (DRISDOL) 50000 units CAPS capsule Take 1 capsule (50,000 Units total) by mouth every 7 (seven) days. 12 capsule 0   No current facility-administered medications on file prior to visit.     PAST MEDICAL HISTORY: Past Medical History:  Diagnosis Date  . Chronic kidney disease   . Diabetes mellitus without complication ([place])   . Eczema   . Enlarged kidney    bilateral  . Gout    previously  . Hyperlipidemia   . Hypertension   . Kidney transplanted   . Polycystic kidney disease   . Psoriasis     PAST SURGICAL HISTORY: Past Surgical History:  Procedure Laterality  Date  . ABDOMINAL HYSTERECTOMY  2000?   complete  . CATARACT EXTRACTION  2011   bilateral  . [place]  . HERNIA REPAIR  5176   umbilical hernia  . HERNIA REPAIR  2013  . NEPHRECTOMY TRANSPLANTED ORGAN    . PERITONEAL CATHETER INSERTION    . VENTRAL HERNIA REPAIR  07/04/2011   Procedure: HERNIA REPAIR VENTRAL ADULT;  Surgeon: Imogene Burn. Georgette Dover, MD;  Location: [facility] OR;  Service: General;  Laterality: N/A;    SOCIAL HISTORY: Social History   Tobacco Use  . Smoking status: Never Smoker  . Smokeless tobacco: Never Used  Substance Use Topics  . Alcohol use: No  . Drug use: No    FAMILY HISTORY: Family History  Problem Relation Age of Onset  . Hypertension Father   . Polycystic kidney disease Father          died before starting dialysis was near ESRD  . Cerebral aneurysm Father        died age 2  . Stroke Father   . Cancer Sister 45       cancer that developed in the fatty tissues of the muscle  . Hypertension Brother   . Polycystic kidney disease Brother        transplant  7 years ago    ROS: Review of Systems  Constitutional: Positive for malaise/fatigue. Negative for weight loss.  Respiratory: Positive for shortness of breath.   Gastrointestinal: Positive for nausea.    PHYSICAL EXAM: Blood pressure 133/84, pulse 80, temperature 97.6 F (36.4 C), temperature source Oral, height 5\' 5"  (1.651 m), weight 285 lb (129.3 kg), SpO2 100 %. Body mass index is 47.43 kg/m. Physical Exam  Constitutional: She is oriented to person, place, and time. She appears well-developed and well-nourished.  Cardiovascular: Normal rate.  Pulmonary/Chest: Effort normal.  Musculoskeletal: Normal range of motion.  Neurological: She is oriented to person, place, and time.  Skin: Skin is warm and dry.  Psychiatric: She has a normal mood and affect. Her behavior is normal.  Vitals reviewed.   RECENT LABS AND TESTS: BMET    Component Value Date/Time   NA 142 09/12/2017 2150   NA 141 08/30/2017 0806   K 4.1 09/12/2017 2150   CL 106 09/12/2017 2150   CO2 24 09/12/2017 2150   GLUCOSE 174 (H) 09/12/2017 2150   BUN 22 (H) 09/12/2017 2150   BUN 19 08/30/2017 0806   CREATININE 1.13 (H) 09/12/2017 2150   CALCIUM 8.9 09/12/2017 2150   GFRNONAA 55 (L) 09/12/2017 2150   GFRAA >60 09/12/2017 2150   Lab Results  Component Value Date   HGBA1C 7.7 (H) 08/30/2017   HGBA1C 7.7 (H) 04/19/2017   Lab Results  Component Value Date   INSULIN 26.1 (H) 04/19/2017   CBC    Component Value Date/Time   WBC 10.5 09/12/2017 2150   RBC 4.55 09/12/2017 2150   HGB 13.1 09/12/2017 2150   HGB 13.0 04/19/2017 0934   HCT 39.5 09/12/2017 2150   HCT 39.9 04/19/2017 0934   PLT 213 09/12/2017 2150   MCV 86.8  09/12/2017 2150   MCV 87 04/19/2017 0934   MCH 28.8 09/12/2017 2150   MCHC 33.2 09/12/2017 2150   RDW 12.8 09/12/2017 2150   RDW 14.1 04/19/2017 0934   LYMPHSABS 1.0 04/19/2017 0934   MONOABS 0.6 07/03/2011 1807   EOSABS 0.2 04/19/2017 0934   BASOSABS 0.0 04/19/2017 0934   Iron/TIBC/Ferritin/ %Sat No results found  for: IRON, TIBC, FERRITIN, IRONPCTSAT Lipid Panel     Component Value Date/Time   CHOL 115 04/19/2017 0934   TRIG 63 04/19/2017 0934   HDL 46 04/19/2017 0934   LDLCALC 56 04/19/2017 0934   Hepatic Function Panel     Component Value Date/Time   PROT 6.3 08/30/2017 0806   ALBUMIN 4.3 08/30/2017 0806   AST 10 08/30/2017 0806   ALT 20 08/30/2017 0806   ALKPHOS 78 08/30/2017 0806   BILITOT 0.4 08/30/2017 0806   BILIDIR 0.2 02/10/2015 1625   IBILI 0.5 02/10/2015 1625      Component Value Date/Time   TSH 2.080 04/19/2017 0934   Results for [name], [name] (MRN [id number]) as of 10/19/2017 09:43  Ref. Range 08/30/2017 08:06  Vitamin D, 25-Hydroxy Latest Ref Range: 30.0 - 100.0 ng/mL 36.4   ASSESSMENT AND PLAN: Renal insufficiency  Vitamin D deficiency  Other fatigue  Shortness of breath on exertion  Type 2 diabetes mellitus without complication, without long-term current use of insulin (HCC)  At risk for osteoporosis  Class 3 severe obesity with serious comorbidity and body mass index (BMI) of 45.0 to 49.9 in adult, unspecified obesity type ([place])  PLAN:  Renal Insufficiency Zoe agrees to call nephrology and find out the amount of grams of protein that she can have per day. We will then set her up with our registered dietitian. She agrees to follow up in 2 weeks for an office visit.  Vitamin D Deficiency Sitlali was informed that low vitamin D levels contributes to fatigue and are associated with obesity, breast, and colon cancer. She agrees to continue to take prescription Vit D @50 ,000 IU every week with no refills needed and will follow up for routine  testing of vitamin D, at least 2-3 times per year. She was informed of the risk of over-replacement of vitamin D and agrees to not increase her dose unless she discusses this with [place] first. She will follow up as directed.  At risk for osteopenia and osteoporosis Jakhiya was given extended (15 minutes) osteoporosis prevention counseling today. Malasia is at risk for osteopenia and osteoporosis due to her vitamin D deficiency. She was encouraged to take her vitamin D and follow her higher calcium diet and increase strengthening exercise to help strengthen her bones and decrease her risk of osteopenia and osteoporosis.  Fatigue and Shortness of breath We repeated an IC today.  Diabetes II Calani has been given extensive diabetes education by myself today including ideal fasting and post-prandial blood glucose readings, individual ideal Hgb A1c goals and hypoglycemia prevention. We discussed the importance of good blood sugar control to decrease the likelihood of diabetic complications such as nephropathy, neuropathy, limb loss, blindness, coronary artery disease, and death. We discussed the importance of intensive lifestyle modification including diet, exercise and weight loss as the first line treatment for diabetes. Monna agrees to continue [place], Glimepiride, linagliptin, and will follow up at the agreed upon time in 2 weeks.  Obesity Evia is currently in the action stage of change. As such, her goal is to continue with weight loss efforts. She has agreed to portion control better and make smarter food choices, such as increase vegetables and decrease simple carbohydrates. She agrees to call her nephrologist to ask about the amount of grams of protein that she should have per day. We will then set her up with our registered dietitian to formulate a diet plan. She does not like pre-packaged meals and cooks from scratch. Janin has  been instructed to work up to a goal of 150 minutes of combined cardio and  strengthening exercise per week for weight loss and overall health benefits. We discussed the following Behavioral Modification Strategies today: increase H2O intake, and no skipping meals.  Garielle has agreed to follow up with our clinic in 2 weeks. She was informed of the importance of frequent follow up visits to maximize her success with intensive lifestyle modifications for her multiple health conditions.   OBESITY BEHAVIORAL INTERVENTION VISIT  Today's visit was # 9   Starting weight: 283 lbs Starting date: 04/19/17 Today's weight : Weight: 285 lb (129.3 kg)  Today's date: 10/18/2017 Total lbs lost to date: 0  ASK: We discussed the diagnosis of obesity with [name] today and Verdis Frederickson agreed to give [place] permission to discuss obesity behavioral modification therapy today.  ASSESS: Lesette has the diagnosis of obesity and her BMI today is 47.43. Estefani is in the action stage of change.   ADVISE: Lorretta was educated on the multiple health risks of obesity as well as the benefit of weight loss to improve her health. She was advised of the need for long term treatment and the importance of lifestyle modifications to improve her current health and to decrease her risk of future health problems.  AGREE: Multiple dietary modification options and treatment options were discussed and Farida agreed to follow the recommendations documented in the above note.  ARRANGE: Tima was educated on the importance of frequent visits to treat obesity as outlined per CMS and USPSTF guidelines and agreed to schedule her next follow up appointment today.  I, Marcille Blanco, am acting as transcriptionist for Starlyn Skeans, MD  I have reviewed the above documentation for accuracy and completeness, and I agree with the above. -Dennard Nip, MD

## 2017-10-24 DIAGNOSIS — E7800 Pure hypercholesterolemia, unspecified: Secondary | ICD-10-CM | POA: Diagnosis not present

## 2017-10-24 DIAGNOSIS — Z23 Encounter for immunization: Secondary | ICD-10-CM | POA: Diagnosis not present

## 2017-10-24 DIAGNOSIS — I10 Essential (primary) hypertension: Secondary | ICD-10-CM | POA: Diagnosis not present

## 2017-10-24 DIAGNOSIS — E1165 Type 2 diabetes mellitus with hyperglycemia: Secondary | ICD-10-CM | POA: Diagnosis not present

## 2017-11-01 ENCOUNTER — Ambulatory Visit (INDEPENDENT_AMBULATORY_CARE_PROVIDER_SITE_OTHER): Payer: BLUE CROSS/BLUE SHIELD | Admitting: Family Medicine

## 2017-11-01 VITALS — BP 135/82 | HR 82 | Temp 97.5°F | Ht 65.0 in | Wt 287.0 lb

## 2017-11-01 DIAGNOSIS — Z794 Long term (current) use of insulin: Secondary | ICD-10-CM

## 2017-11-01 DIAGNOSIS — Z6841 Body Mass Index (BMI) 40.0 and over, adult: Secondary | ICD-10-CM

## 2017-11-01 DIAGNOSIS — E119 Type 2 diabetes mellitus without complications: Secondary | ICD-10-CM | POA: Diagnosis not present

## 2017-11-04 NOTE — Progress Notes (Signed)
Office: [phone]  /  Fax: [phone]   HPI:   Chief Complaint: OBESITY [name] [name] is here to discuss her progress with her obesity treatment plan. She is on the portion control better and make smarter food choices, such as increase vegetables and decrease simple carbohydrates and is following her eating plan approximately 40 % of the time. She states she is exercising 0 minutes 0 times per week. [name] [name] has struggled to lose weight with our program. Her RMR is good at 2220 and she was given a 1200 calorie diet, but she has still gained weight. She feels she is doing everything right. She doesn't like a lot of the food on her plan. Her weight is 287 lb (130.2 kg) today and has gained 2 pounds since her last visit. She has lost 0 lbs since starting treatment with [place].  Diabetes II [name] [name] has a diagnosis of diabetes type II. [name] [name] states her A1c is worsening from 7.0 to 8.8, while she has been trying to lose weight on a 1200 calorie diet. She has gained 2 lbs in the last 3 weeks. She has been working on intensive lifestyle modifications including diet, exercise, and weight loss to help control her blood glucose levels. She denies hypoglycemia.  ALLERGIES: Allergies  Allergen Reactions  . Tape   . Vancomycin     MEDICATIONS: Current Outpatient Medications on File Prior to Visit  Medication Sig Dispense Refill  . aspirin 325 MG tablet Take 325 mg by mouth daily.    Marland Kitchen atorvastatin (LIPITOR) 40 MG tablet Take 40 mg by mouth at bedtime.     . insulin aspart protamine- aspart (NOVOLOG MIX 70/30) (70-30) 100 UNIT/ML injection Inject 25 Units into the skin 2 (two) times daily with a meal.    . linagliptin (TRADJENTA) 5 MG TABS tablet Take 5 mg by mouth daily.    . mycophenolate (MYFORTIC) 360 MG TBEC EC tablet Take 720 mg by mouth 2 (two) times daily.    . tacrolimus (PROGRAF) 1 MG capsule Take 1-2 mg by mouth 2 (two) times daily. Take 2 tabs (2 mg) in the am and Take 1 tab (1 mg) at qhs.    .  Vitamin D, Ergocalciferol, (DRISDOL) 50000 units CAPS capsule Take 1 capsule (50,000 Units total) by mouth every 7 (seven) days. 12 capsule 0   No current facility-administered medications on file prior to visit.     PAST MEDICAL HISTORY: Past Medical History:  Diagnosis Date  . Chronic kidney disease   . Diabetes mellitus without complication ([name] [name])   . Eczema   . Enlarged kidney    bilateral  . Gout    previously  . Hyperlipidemia   . Hypertension   . Kidney transplanted   . Polycystic kidney disease   . Psoriasis     PAST SURGICAL HISTORY: Past Surgical History:  Procedure Laterality Date  . ABDOMINAL HYSTERECTOMY  2000?   complete  . CATARACT EXTRACTION  2011   bilateral  . Ada  . HERNIA REPAIR  1448   umbilical hernia  . HERNIA REPAIR  2013  . NEPHRECTOMY TRANSPLANTED ORGAN    . PERITONEAL CATHETER INSERTION    . VENTRAL HERNIA REPAIR  07/04/2011   Procedure: HERNIA REPAIR VENTRAL ADULT;  Surgeon: Imogene Burn. Georgette Dover, MD;  Location: [facility];  Service: General;  Laterality: N/A;    SOCIAL HISTORY: Social History   Tobacco Use  . Smoking status: Never Smoker  . Smokeless tobacco: Never  Used  Substance Use Topics  . Alcohol use: No  . Drug use: No    FAMILY HISTORY: Family History  Problem Relation Age of Onset  . Hypertension Father   . Polycystic kidney disease Father        died before starting dialysis was near ESRD  . Cerebral aneurysm Father        died age 45  . Stroke Father   . Cancer Sister 79       cancer that developed in the fatty tissues of the muscle  . Hypertension Brother   . Polycystic kidney disease Brother        transplant  7 years ago    ROS: Review of Systems  Constitutional: Negative for weight loss.  Endo/Heme/Allergies:       Negative hypoglycemia    PHYSICAL EXAM: Blood pressure 135/82, pulse 82, temperature (!) 97.5 F (36.4 C), temperature source Oral, height 5\' 5"  (1.651 m), weight 287 lb  (130.2 kg), SpO2 97 %. Body mass index is 47.76 kg/m. Physical Exam  Constitutional: She is oriented to person, place, and time. She appears well-developed and well-nourished.  Cardiovascular: Normal rate.  Pulmonary/Chest: Effort normal.  Musculoskeletal: Normal range of motion.  Neurological: She is oriented to person, place, and time.  Skin: Skin is warm and dry.  Psychiatric: She has a normal mood and affect. Her behavior is normal.  Vitals reviewed.   RECENT LABS AND TESTS: BMET    Component Value Date/Time   NA 142 09/12/2017 2150   NA 141 08/30/2017 0806   K 4.1 09/12/2017 2150   CL 106 09/12/2017 2150   CO2 24 09/12/2017 2150   GLUCOSE 174 (H) 09/12/2017 2150   BUN 22 (H) 09/12/2017 2150   BUN 19 08/30/2017 0806   CREATININE 1.13 (H) 09/12/2017 2150   CALCIUM 8.9 09/12/2017 2150   GFRNONAA 55 (L) 09/12/2017 2150   GFRAA >60 09/12/2017 2150   Lab Results  Component Value Date   HGBA1C 7.7 (H) 08/30/2017   HGBA1C 7.7 (H) 04/19/2017   Lab Results  Component Value Date   INSULIN 26.1 (H) 04/19/2017   CBC    Component Value Date/Time   WBC 10.5 09/12/2017 2150   RBC 4.55 09/12/2017 2150   HGB 13.1 09/12/2017 2150   HGB 13.0 04/19/2017 0934   HCT 39.5 09/12/2017 2150   HCT 39.9 04/19/2017 0934   PLT 213 09/12/2017 2150   MCV 86.8 09/12/2017 2150   MCV 87 04/19/2017 0934   MCH 28.8 09/12/2017 2150   MCHC 33.2 09/12/2017 2150   RDW 12.8 09/12/2017 2150   RDW 14.1 04/19/2017 0934   LYMPHSABS 1.0 04/19/2017 0934   MONOABS 0.6 07/03/2011 1807   EOSABS 0.2 04/19/2017 0934   BASOSABS 0.0 04/19/2017 0934   Iron/TIBC/Ferritin/ %Sat No results found for: IRON, TIBC, FERRITIN, IRONPCTSAT Lipid Panel     Component Value Date/Time   CHOL 115 04/19/2017 0934   TRIG 63 04/19/2017 0934   HDL 46 04/19/2017 0934   LDLCALC 56 04/19/2017 0934   Hepatic Function Panel     Component Value Date/Time   PROT 6.3 08/30/2017 0806   ALBUMIN 4.3 08/30/2017 0806   AST  10 08/30/2017 0806   ALT 20 08/30/2017 0806   ALKPHOS 78 08/30/2017 0806   BILITOT 0.4 08/30/2017 0806   BILIDIR 0.2 02/10/2015 1625   IBILI 0.5 02/10/2015 1625      Component Value Date/Time   TSH 2.080 04/19/2017 0934  ASSESSMENT AND PLAN: Type 2 diabetes mellitus without complication, with long-term current use of insulin (HCC)  Class 3 severe obesity with serious comorbidity and body mass index (BMI) of 45.0 to 49.9 in adult, unspecified obesity type ([place])  PLAN:  Diabetes II Dezi has been given extensive diabetes education by myself today including ideal fasting and post-prandial blood glucose readings, individual ideal Hgb A1c goals and hypoglycemia prevention. We discussed the importance of good blood sugar control to decrease the likelihood of diabetic complications such as nephropathy, neuropathy, limb loss, blindness, coronary artery disease, and death. We discussed the importance of intensive lifestyle modification including diet, exercise and weight loss as the first line treatment for diabetes. [name] [name] is struggling with compliance on her diet prescription, she will change to the Low carbohydrate plan and she is to watch for hypoglycemia. [name] [name] agrees to continue her diabetes medications and she agrees to follow up with our clinic in 2 to 3 weeks.  I spent > than 50% of the 15 minute visit on counseling as documented in the note.  Obesity [name] [name] is currently in the action stage of change. As such, her goal is to continue with weight loss efforts She has agreed to change to follow a lower carbohydrate, vegetable and lean protein rich diet plan [name] [name] has been instructed to work up to a goal of 150 minutes of combined cardio and strengthening exercise per week for weight loss and overall health benefits. We discussed the following Behavioral Modification Strategies today: increasing lean protein intake, decreasing simple carbohydrates  and work on meal planning and easy cooking  plans   [name] [name] has agreed to follow up with our clinic in 2 to 3 weeks. She was informed of the importance of frequent follow up visits to maximize her success with intensive lifestyle modifications for her multiple health conditions.   OBESITY BEHAVIORAL INTERVENTION VISIT  Today's visit was # 10   Starting weight: 283 lbs Starting date: 04/19/17 Today's weight : 287 lbs Today's date: 11/01/2017 Total lbs lost to date: 0    ASK: We discussed the diagnosis of obesity with [name] [name] today and [name] [name] agreed to give [place] permission to discuss obesity behavioral modification therapy today.  ASSESS: [name] [name] has the diagnosis of obesity and her BMI today is 69.76 [name] [name] is in the action stage of change   ADVISE: Sharel was educated on the multiple health risks of obesity as well as the benefit of weight loss to improve her health. She was advised of the need for long term treatment and the importance of lifestyle modifications to improve her current health and to decrease her risk of future health problems.  AGREE: Multiple dietary modification options and treatment options were discussed and  [name] [name] agreed to follow the recommendations documented in the above note.  ARRANGE: [name] [name] was educated on the importance of frequent visits to treat obesity as outlined per CMS and USPSTF guidelines and agreed to schedule her next follow up appointment today.  I, Trixie Dredge, am acting as transcriptionist for Dennard Nip, MD  I have reviewed the above documentation for accuracy and completeness, and I agree with the above. -Dennard Nip, MD

## 2017-11-07 ENCOUNTER — Encounter (INDEPENDENT_AMBULATORY_CARE_PROVIDER_SITE_OTHER): Payer: Self-pay | Admitting: Family Medicine

## 2017-11-16 DIAGNOSIS — Z9483 Pancreas transplant status: Secondary | ICD-10-CM | POA: Diagnosis not present

## 2017-11-16 DIAGNOSIS — E785 Hyperlipidemia, unspecified: Secondary | ICD-10-CM | POA: Diagnosis not present

## 2017-11-16 DIAGNOSIS — I129 Hypertensive chronic kidney disease with stage 1 through stage 4 chronic kidney disease, or unspecified chronic kidney disease: Secondary | ICD-10-CM | POA: Diagnosis not present

## 2017-11-16 DIAGNOSIS — Z418 Encounter for other procedures for purposes other than remedying health state: Secondary | ICD-10-CM | POA: Diagnosis not present

## 2017-11-16 DIAGNOSIS — R739 Hyperglycemia, unspecified: Secondary | ICD-10-CM | POA: Diagnosis not present

## 2017-11-16 DIAGNOSIS — Z940 Kidney transplant status: Secondary | ICD-10-CM | POA: Diagnosis not present

## 2017-11-16 DIAGNOSIS — M109 Gout, unspecified: Secondary | ICD-10-CM | POA: Diagnosis not present

## 2017-11-16 DIAGNOSIS — Z48298 Encounter for aftercare following other organ transplant: Secondary | ICD-10-CM | POA: Diagnosis not present

## 2017-11-21 ENCOUNTER — Encounter (INDEPENDENT_AMBULATORY_CARE_PROVIDER_SITE_OTHER): Payer: Self-pay

## 2017-11-21 ENCOUNTER — Other Ambulatory Visit (INDEPENDENT_AMBULATORY_CARE_PROVIDER_SITE_OTHER): Payer: Self-pay | Admitting: Family Medicine

## 2017-11-21 DIAGNOSIS — E559 Vitamin D deficiency, unspecified: Secondary | ICD-10-CM

## 2017-11-23 ENCOUNTER — Encounter (INDEPENDENT_AMBULATORY_CARE_PROVIDER_SITE_OTHER): Payer: Self-pay

## 2017-11-23 ENCOUNTER — Ambulatory Visit (INDEPENDENT_AMBULATORY_CARE_PROVIDER_SITE_OTHER): Payer: BLUE CROSS/BLUE SHIELD | Admitting: Family Medicine

## 2017-11-29 DIAGNOSIS — R739 Hyperglycemia, unspecified: Secondary | ICD-10-CM | POA: Diagnosis not present

## 2017-11-29 DIAGNOSIS — I129 Hypertensive chronic kidney disease with stage 1 through stage 4 chronic kidney disease, or unspecified chronic kidney disease: Secondary | ICD-10-CM | POA: Diagnosis not present

## 2017-11-29 DIAGNOSIS — Z940 Kidney transplant status: Secondary | ICD-10-CM | POA: Diagnosis not present

## 2017-11-29 DIAGNOSIS — Z79899 Other long term (current) drug therapy: Secondary | ICD-10-CM | POA: Diagnosis not present

## 2017-12-04 ENCOUNTER — Encounter (HOSPITAL_COMMUNITY): Payer: Self-pay | Admitting: Emergency Medicine

## 2017-12-04 ENCOUNTER — Other Ambulatory Visit: Payer: Self-pay

## 2017-12-04 NOTE — H&P (Addendum)
History of Present Illness  General:  52/female was referred for a screening colonoscopy. Labs from 7/19 showed slighlty low HCO3 of 21, BS of 148, otherwise unremarkable,she had a kideny transplant in 2013 and takes prograf and mycophenolate. Every once in a while she has fluctuation between diarrhea and constipatoin, normally she has a Bm 1-2/day, consistency of stool is solid, non bloody and not black stools.  There is no family history of colon cancer, and no prior colonoscopy. She was on peritoneal HD for 9 months prior to transplant.  She takes her medications in the morning, she takes glimeperide in the morning. She takes ASA 325 mg, she had PCKD, was told she needed to take it for longevity of transplant. She c/o waking up with acid in her throat 3-4/year, denies difficulty or pain swallowing. Denies bloating or early satiety. Denies weight loss.   Current Medications  Taking   Atorvastatin Calcium 40 MG Tablet 1 tablet Orally Once a day per Dr Moshe Cipro, Notes: Moshe Cipro   Myfortic(Mycophenolate Sodium) 360 MG Tablet Delayed Release 2 tablets Orally twice daily, Notes: Dr Moshe Cipro   Prograf(Tacrolimus) 1 MG Capsule 2 in the morning and 1 in the evening Orally twice a day, Notes: Dr Moshe Cipro   Tradjenta(linaGLIPtin) 5 MG Tablet 1 tablet Orally Once a day, Notes: BALAN   Glimepiride 4 MG Tablet TAKE 1 TABLET BY MOUTH EVERY MORNING WITH BREAKFAST OR THE FIRST MAIN MEAL OF THE DAY Orally bid, Notes: BALAN   Tresiba(Insulin Degludec) 100 UNIT/ML Solution 40 units at bedtime Subcutaneous , Notes: BALAN   Aspirin 325 MG Tablet 1 tablet Orally Once a day   Vitamin D (Ergocalciferol) 50000 UNIT Capsule 1 capsule Orally once a week, Notes: BEASLEY   Not-Taking   Clindagel(Clindamycin Phosphate) 1 % Gel 1 application to affected area Externally Once a day prn   Medication List reviewed and reconciled with the patient    Past Medical History  polycystic kidney, nephrology  follows Dr. Elesa Massed donor transplant 2013.    Surgical History  c-section x 2 95 & 97  hysterectomy/BSO 2000  hernia surgery 2005  cataract surgery x 2 2000  kidney transplant 09/2011   Family History  Father: deceased, polycystic kidney disease  Mother: alive, A + W  No Gyn cancer in family.  neg family hx of colon cancer, polyps or liver disease.   Social History  General:  no EXPOSURE TO PASSIVE SMOKE.  no Alcohol.  Children: Boys, 1, girls, 1.  Tobacco use  cigarettes: Never smoked Tobacco history last updated 08/22/2017 Marital Status: married.  no Recreational drug use.  Exercise: intermittent.    Allergies  Vancomycin HCl: iv (HIVES) - Allergy  Adhesive tape: RASH - Allergy   Hospitalization/Major Diagnostic Procedure  childbirth x 2   hysterectomy/BSO   kidney transplant 09/2011  not in the past yr 09/2017   Review of Systems  CONSTITUTIONAL:  Chills No. Fatigue No. Fever No. Insomnia No. Night sweats No. Weight change No.  HEENT:  Change in vision No. Double vision No . Hoarseness No. Nose bleeds No. sore throat No. Sinus Problems No. Glaucoma No.  CARDIOLOGY:  ByPass Surgery No. Poor Circulation No. Artificial Heart Valves No. High blood pressure No. History of Heart attack No. Irregular heart beat No. Known coronary artery disease No. Pacemaker/Defibrillator No.  RESPIRATORY:  Shortness of breath No. Cough No. Excessive Sputum No. Using Oxygen No. Asthma No. Bronchitis No. Pneumonia No. Sleep Apnea No.  UROLOGY:  Interstitials Cystitis No. Incontinence No.  Blood in urine No. Difficulty urinating No. Kidney disease YES. Kidney stones No.  GASTROENTEROLOGY:  Abdominal pain No. Acid reflux YES. Black stools No. Bloating/belching No. Change in bowel habits No. Constipation No. Dark tarry stools No. Diarrhea No. Difficulty swallowing No. Heartburn No. Incontinence of stool No. Indigestion: No. Lactose intolerance No. Nausea No. Rectal bleeding No.  Vomiting No. Hepatitis/yellow jaundice No. History of Ulcers No. Use of Pain Medication No. Previous Colonoscopy No.  MUSCULOSKELETAL:  joint pain YES. Arthritis No. Joint Replacement No.  NEUROLOGY:  Dizziness No. Fainting No. Headache YES. Paralysis No. Seizures No. Strokes No. Weakness No. Alzheimer's No.  SKIN:  Rash No. Bruises easily YES.  ENDOCRINOLOGY:  Diabetes YES. High cholesterol No. Thyroid disorder No.  HEMATOLOGY/LYMPH:  Abnormal bleeding No. Anemia No. Enlarged lymph nodes No. Past blood transfusion No. Swollen glands No. Blood Clots No. Using Blood Thinners No.  INFECTIOUS DISEASE:  HIV/AIDS No. Tuberculosis No . Hepatitis No. Sexually Transmitted Disease No. MRSA No.  GI PROCEDURE:  no Pacemaker/ AICD, no. no Artificial heart valves. no MI/heart attack. no Abnormal heart rhythm. no Angina. no CVA. no Hypertension. no Hypotension. no Asthma, COPD. no Sleep apnea. no Seizure disorders. no Artificial joints. no Severe DJD. Diabetes YES. no Significant headaches. no Vertigo. no Depression/anxiety. no Abnormal bleeding. no Kidney Disease. no Liver disease, no. no Chance of pregnancy. no Blood transfusion. no Method of Birth Control. no Birth control pills.  GU/GYN:  Pregnant Now No. Trying to conceive No. Use Birth Control No. Heavy Periods No.       Vital Signs  Wt 290.8, Wt change 4.8 lb, Ht 63.5, BMI 50.70, Temp 97.0, Pulse sitting 86, BP sitting 154/100.   Examination  Gastroenterology:: GENERAL APPEARANCE: Well developed, obese, no active distress, pleasant.  EYES: Lids and conjunctiva normal. Sclera normal.  ORAL CAVITY: Lips, teeth and gums are normal. Pharynx, tongue, mucosa normal .  SCLERA: anicteric .  NECK Full ROM, trachea midline, no thyromegaly or masses .  CARDIOVASCULAR RRR no murmur, Normal RRR w/o murmers or gallops. No peripheral edema .  RESPIRATORY Breath sounds normal. Respiration even and unlabored .  ABDOMEN No masses palpated. Liver and spleen  not palpated, normal. Bowel sounds normal, Abdomen not distended .  EXTREMITIES: No edema, pulses intact .  NEURO: normal strength, normal gait .  PSYCH: mood/affect normal .     Assessments   1. Screen for colon cancer - Z12.11 (Primary)   Treatment  1. Screen for colon cancer  IMAGING: Colonoscopy    Whitfield,Dia 10/03/2017 10:05:24 AM > spoke with Vickie-scheduled for 12/05/17-[facility]-at 8:3am-prep instructions reviewed with pt.   Notes: Advised patient to stop ASa 325 mg for 5 days before the procedure and take ASA 81 mg for those days. The risks and benefits of the procedure were discussed with the patient in details. Since her BMI is above 50, she will be scheduled for an outpatient colonoscopy at [facility]. She will be given written instructions, prescription for preparation and will be scheduled for the same.    Visit Codes  [id number] NEW OV LEVEL 2.    Follow Up  prn

## 2017-12-05 ENCOUNTER — Encounter (HOSPITAL_COMMUNITY): Payer: Self-pay | Admitting: *Deleted

## 2017-12-05 ENCOUNTER — Encounter (HOSPITAL_COMMUNITY)
Admission: RE | Disposition: A | Discharge status: Home / Self Care | Payer: Self-pay | Source: Ambulatory Visit | Attending: Gastroenterology

## 2017-12-05 ENCOUNTER — Ambulatory Visit (HOSPITAL_COMMUNITY): Payer: BLUE CROSS/BLUE SHIELD | Admitting: Certified Registered Nurse Anesthetist

## 2017-12-05 ENCOUNTER — Ambulatory Visit (HOSPITAL_COMMUNITY)
Admission: RE | Admit: 2017-12-05 | Discharge: 2017-12-05 | Disposition: A | Discharge status: Home / Self Care | Payer: BLUE CROSS/BLUE SHIELD | Source: Ambulatory Visit | Attending: Gastroenterology | Admitting: Gastroenterology

## 2017-12-05 ENCOUNTER — Other Ambulatory Visit: Payer: Self-pay

## 2017-12-05 DIAGNOSIS — Z888 Allergy status to other drugs, medicaments and biological substances status: Secondary | ICD-10-CM | POA: Diagnosis not present

## 2017-12-05 DIAGNOSIS — K5730 Diverticulosis of large intestine without perforation or abscess without bleeding: Secondary | ICD-10-CM | POA: Insufficient documentation

## 2017-12-05 DIAGNOSIS — Z1211 Encounter for screening for malignant neoplasm of colon: Secondary | ICD-10-CM | POA: Diagnosis not present

## 2017-12-05 DIAGNOSIS — D1339 Benign neoplasm of other parts of small intestine: Secondary | ICD-10-CM | POA: Diagnosis not present

## 2017-12-05 DIAGNOSIS — Z79899 Other long term (current) drug therapy: Secondary | ICD-10-CM | POA: Diagnosis not present

## 2017-12-05 DIAGNOSIS — D3A012 Benign carcinoid tumor of the ileum: Secondary | ICD-10-CM | POA: Insufficient documentation

## 2017-12-05 DIAGNOSIS — I120 Hypertensive chronic kidney disease with stage 5 chronic kidney disease or end stage renal disease: Secondary | ICD-10-CM | POA: Diagnosis not present

## 2017-12-05 DIAGNOSIS — Z881 Allergy status to other antibiotic agents status: Secondary | ICD-10-CM | POA: Diagnosis not present

## 2017-12-05 DIAGNOSIS — N186 End stage renal disease: Secondary | ICD-10-CM | POA: Diagnosis not present

## 2017-12-05 DIAGNOSIS — Z940 Kidney transplant status: Secondary | ICD-10-CM | POA: Insufficient documentation

## 2017-12-05 DIAGNOSIS — K633 Ulcer of intestine: Secondary | ICD-10-CM | POA: Diagnosis not present

## 2017-12-05 DIAGNOSIS — Z7982 Long term (current) use of aspirin: Secondary | ICD-10-CM | POA: Insufficient documentation

## 2017-12-05 HISTORY — PX: COLONOSCOPY: SHX5424

## 2017-12-05 HISTORY — PX: BIOPSY: SHX5522

## 2017-12-05 LAB — GLUCOSE, CAPILLARY: Glucose-Capillary: 129 mg/dL — ABNORMAL HIGH (ref 70–99)

## 2017-12-05 SURGERY — COLONOSCOPY
Anesthesia: Monitor Anesthesia Care

## 2017-12-05 MED ORDER — LIDOCAINE 2% (20 MG/ML) 5 ML SYRINGE
INTRAMUSCULAR | Status: DC | PRN
  Administered 2017-12-05: 60 mg via INTRAVENOUS

## 2017-12-05 MED ORDER — SODIUM CHLORIDE 0.9 % IV SOLN: INTRAVENOUS | Status: DC

## 2017-12-05 MED ORDER — PROPOFOL 10 MG/ML IV BOLUS
INTRAVENOUS | Status: DC | PRN
  Administered 2017-12-05 (×2): 10 mg via INTRAVENOUS
  Administered 2017-12-05: 20 mg via INTRAVENOUS

## 2017-12-05 MED ORDER — PROPOFOL 10 MG/ML IV BOLUS
INTRAVENOUS | Status: AC
  Filled 2017-12-05: qty 20

## 2017-12-05 MED ORDER — PROPOFOL 10 MG/ML IV BOLUS
INTRAVENOUS | Status: AC
  Filled 2017-12-05: qty 40

## 2017-12-05 MED ORDER — SODIUM CHLORIDE 0.9 % IV SOLN
INTRAVENOUS | Status: DC
  Administered 2017-12-05: 07:00:00 via INTRAVENOUS

## 2017-12-05 MED ORDER — ONDANSETRON HCL 4 MG/2ML IJ SOLN
INTRAMUSCULAR | Status: DC | PRN
  Administered 2017-12-05: 4 mg via INTRAVENOUS

## 2017-12-05 MED ORDER — PROPOFOL 500 MG/50ML IV EMUL
INTRAVENOUS | Status: DC | PRN
  Administered 2017-12-05: 150 ug/kg/min via INTRAVENOUS

## 2017-12-05 NOTE — Transfer of Care (Signed)
Immediate Anesthesia Transfer of Care Note  Patient: [name] [name]  Procedure(s) Performed: COLONOSCOPY (N/A ) BIOPSY HEMOSTASIS CLIP PLACEMENT  Patient Location: PACU  Anesthesia Type:MAC  Level of Consciousness: awake, alert  and oriented  Airway & Oxygen Therapy: Patient Spontanous Breathing and Patient connected to face mask oxygen  Post-op Assessment: Report given to RN and Post -op Vital signs reviewed and stable  Post vital signs: Reviewed and stable  Last Vitals:  Vitals Value Taken Time  BP 164/95 12/05/2017  8:25 AM  Temp    Pulse 75 12/05/2017  8:27 AM  Resp 21 12/05/2017  8:27 AM  SpO2 100 % 12/05/2017  8:27 AM  Vitals shown include unvalidated device data.  Last Pain:  Vitals:   12/05/17 0711  PainSc: 0-No pain         Complications: No apparent anesthesia complications

## 2017-12-05 NOTE — Anesthesia Preprocedure Evaluation (Addendum)
Anesthesia Evaluation  Patient identified by MRN, date of birth, ID band Patient awake    Reviewed: Allergy & Precautions, NPO status , Patient's Chart, lab work & pertinent test results  Airway Mallampati: I  TM Distance: >3 FB Neck ROM: Full    Dental  (+) Teeth Intact, Dental Advisory Given   Pulmonary neg pulmonary ROS,    breath sounds clear to auscultation       Cardiovascular hypertension,  Rhythm:Regular Rate:Normal     Neuro/Psych negative neurological ROS     GI/Hepatic negative GI ROS, Neg liver ROS,   Endo/Other  diabetes, Type 2, Insulin Dependent, Oral Hypoglycemic Agents  Renal/GU Renal InsufficiencyRenal disease     Musculoskeletal negative musculoskeletal ROS (+)   Abdominal Normal abdominal exam  (+)   Peds  Hematology negative hematology ROS (+)   Anesthesia Other Findings   Reproductive/Obstetrics                            Anesthesia Physical Anesthesia Plan  ASA: III  Anesthesia Plan: MAC   Post-op Pain Management:    Induction: Intravenous  PONV Risk Score and Plan: Propofol infusion and Ondansetron  Airway Management Planned: Natural Airway and Simple Face Mask  Additional Equipment: None  Intra-op Plan:   Post-operative Plan:   Informed Consent: I have reviewed the patients History and Physical, chart, labs and discussed the procedure including the risks, benefits and alternatives for the proposed anesthesia with the patient or authorized representative who has indicated his/her understanding and acceptance.     Plan Discussed with: CRNA  Anesthesia Plan Comments:       Anesthesia Quick Evaluation

## 2017-12-05 NOTE — Brief Op Note (Signed)
12/05/2017  8:22 AM  PATIENT:  [name] [name]  52 y.o. female  PRE-OPERATIVE DIAGNOSIS:  Screen for colon cancer  POST-OPERATIVE DIAGNOSIS:  ulcerated terminal ileum nodule biopsied, clip placed for hemostasis  PROCEDURE:  Procedure(s): COLONOSCOPY (N/A) BIOPSY HEMOSTASIS CLIP PLACEMENT  SURGEON:  Surgeon(s) and Role:    Ronnette Juniper, MD - Primary  PHYSICIAN ASSISTANT:   ASSISTANTS: Vista Lawman, RN, Tinnie Gens, Tech   ANESTHESIA:   MAC  EBL:  Minimal  BLOOD ADMINISTERED:none  DRAINS: none   LOCAL MEDICATIONS USED:  NONE  SPECIMEN:  Biopsy / Limited Resection  DISPOSITION OF SPECIMEN:  PATHOLOGY  COUNTS:  YES  TOURNIQUET:  * No tourniquets in log *  DICTATION: .Dragon Dictation  PLAN OF CARE: Discharge to home after PACU  PATIENT DISPOSITION:  PACU - hemodynamically stable.   Delay start of Pharmacological VTE agent (>24hrs) due to surgical blood loss or risk of bleeding: yes

## 2017-12-05 NOTE — Anesthesia Postprocedure Evaluation (Signed)
Anesthesia Post Note  Patient: [name] [name]  Procedure(s) Performed: COLONOSCOPY (N/A ) BIOPSY HEMOSTASIS CLIP PLACEMENT     Patient location during evaluation: PACU Anesthesia Type: MAC Level of consciousness: awake and alert Pain management: pain level controlled Vital Signs Assessment: post-procedure vital signs reviewed and stable Respiratory status: spontaneous breathing, nonlabored ventilation, respiratory function stable and patient connected to nasal cannula oxygen Cardiovascular status: stable and blood pressure returned to baseline Postop Assessment: no apparent nausea or vomiting Anesthetic complications: no    Last Vitals:  Vitals:   12/05/17 0830 12/05/17 0840  BP: (!) 179/98 (!) 156/87  Pulse: 72 72  Resp: 18 16  Temp: 36.6 C   SpO2: 100% 100%    Last Pain:  Vitals:   12/05/17 0840  TempSrc:   PainSc: 0-No pain                 Effie Berkshire

## 2017-12-05 NOTE — Op Note (Signed)
[facility] Patient Name: [name] [name] Procedure Date: 12/05/2017 MRN: [id number] Attending MD: Ronnette Juniper , MD Date of Birth: December 22, 1965 CSN: [id number] Age: 52 Admit Type: Outpatient Procedure:                Colonoscopy Indications:              Screening for colorectal malignant neoplasm, This                            is the patient's first colonoscopy Providers:                Ronnette Juniper, MD, Vista Lawman, RN, Tinnie Gens,                            Technician, Virgia Land, CRNA Referring MD:              Medicines:                Monitored Anesthesia Care Complications:            No immediate complications. Estimated blood loss:                            Minimal. Estimated Blood Loss:     Estimated blood loss was minimal. Procedure:                Pre-Anesthesia Assessment:                           - Prior to the procedure, a History and Physical                            was performed, and patient medications and                            allergies were reviewed. The patient's tolerance of                            previous anesthesia was also reviewed. The risks                            and benefits of the procedure and the sedation                            options and risks were discussed with the patient.                            All questions were answered, and informed consent                            was obtained. Prior Anticoagulants: The patient has                            taken aspirin, last dose was 5 days prior to  procedure. ASA Grade Assessment: III - A patient                            with severe systemic disease. After reviewing the                            risks and benefits, the patient was deemed in                            satisfactory condition to undergo the procedure.                           After obtaining informed consent, the colonoscope                            was passed under  direct vision. Throughout the                            procedure, the patient's blood pressure, pulse, and                            oxygen saturations were monitored continuously. The                            PCF-H190DL ([id number] Olympus peds colonoscope was                            introduced through the anus and advanced to the the                            terminal ileum. The colonoscopy was performed                            without difficulty. The patient tolerated the                            procedure well. The quality of the bowel                            preparation was good. Scope In: 8:04:29 AM Scope Out: 8:20:06 AM Scope Withdrawal Time: 0 hours 11 minutes 46 seconds  Total Procedure Duration: 0 hours 15 minutes 37 seconds  Findings:      The perianal and digital rectal examinations were normal.      Multiple small and large-mouthed diverticula were found in the sigmoid       colon and descending colon.      The terminal ileum contained one sessile, non-bleeding, erythematous       ulcerated polypoid nodule, 8 mm in diameter. Biopsies were taken with a       cold forceps for histology. To stop active bleeding, one hemostatic clip       was successfully placed. There was no bleeding at the end of the       procedure. The rest of the terminal ileum appeared unremarkable.      The exam was otherwise  without abnormality on direct and retroflexion       views. Impression:               - Diverticulosis in the sigmoid colon and in the                            descending colon.                           - One ileal ulerated, erythematous, polypoid nodule                            in the terminal ileum. Biopsied. Clip was placed.                           - The examination was otherwise normal on direct                            and retroflexion views. Moderate Sedation:      Patient did not receive moderate sedation for this procedure, but       instead  received monitored anesthesia care. Recommendation:           - Patient has a contact number available for                            emergencies. The signs and symptoms of potential                            delayed complications were discussed with the                            patient. Return to normal activities tomorrow.                            Written discharge instructions were provided to the                            patient.                           - Resume regular diet.                           - Resume aspirin at prior dose tomorrow.                           - Await pathology results.                           - Repeat colonoscopy in 10 years for screening                            purposes. Procedure Code(s):        --- Professional ---  45382, Colonoscopy, flexible; with control of                            bleeding, any method Diagnosis Code(s):        --- Professional ---                           Z12.11, Encounter for screening for malignant                            neoplasm of colon                           D13.39, Benign neoplasm of other parts of small                            intestine                           K57.30, Diverticulosis of large intestine without                            perforation or abscess without bleeding CPT copyright 2018 [employer]. All rights reserved. The codes documented in this report are preliminary and upon coder review may  be revised to meet current compliance requirements. Ronnette Juniper, MD 12/05/2017 8:29:01 AM This report has been signed electronically. Number of Addenda: 0

## 2017-12-05 NOTE — Discharge Instructions (Signed)

## 2017-12-06 ENCOUNTER — Encounter (HOSPITAL_COMMUNITY): Payer: Self-pay | Admitting: Gastroenterology

## 2017-12-12 ENCOUNTER — Other Ambulatory Visit: Payer: Self-pay | Admitting: Gastroenterology

## 2017-12-12 DIAGNOSIS — D3A012 Benign carcinoid tumor of the ileum: Secondary | ICD-10-CM

## 2017-12-13 NOTE — Progress Notes (Signed)
  Oncology Nurse Navigator Documentation  Navigator Location: [facility] (12/13/17 1429) Referral date to RadOnc/MedOnc: 12/12/17 (12/13/17 1429) )Navigator Encounter Type: Telephone (12/13/17 1429) Telephone: Outgoing Call (12/13/17 1429) Abnormal Finding Date: 12/05/17 (12/13/17 1429) Confirmed Diagnosis Date: 12/08/17 (12/13/17 1429)    Called and left VM requesting call back from patient to discuss next steps.                   Interventions: Coordination of Care (12/13/17 1429)            Acuity: Level 3 (12/13/17 1429)         Time Spent with Patient: 15 (12/13/17 1429)

## 2017-12-14 NOTE — Progress Notes (Signed)
  Oncology Nurse Navigator Documentation  Patient returned my call to discuss next steps. Patient was confused why she would need to meet with an oncologist.  She heard her GI MD say that it is a benign tumor that needed to be operated on urgently. She wanted to know how quickly she could have her surgery. I encouraged her to make a list of questions to ask Dr. Dema Severin at  surgical consult on 12/20/17. I explained that she will meet with an oncologist after surgery or prior to surgery if Dr. Dema Severin recommends. CT AP scheduled for 12/15/17.  [name] [name] has my contact information for concerns or questions. I will continue to follow.

## 2017-12-15 ENCOUNTER — Ambulatory Visit
Admission: RE | Admit: 2017-12-15 | Discharge: 2017-12-15 | Disposition: A | Discharge status: Home / Self Care | Payer: BLUE CROSS/BLUE SHIELD | Source: Ambulatory Visit | Attending: Gastroenterology | Admitting: Gastroenterology

## 2017-12-15 DIAGNOSIS — K5730 Diverticulosis of large intestine without perforation or abscess without bleeding: Secondary | ICD-10-CM | POA: Diagnosis not present

## 2017-12-15 DIAGNOSIS — D3A012 Benign carcinoid tumor of the ileum: Secondary | ICD-10-CM

## 2017-12-15 DIAGNOSIS — K769 Liver disease, unspecified: Secondary | ICD-10-CM | POA: Diagnosis not present

## 2017-12-15 MED ORDER — IOPAMIDOL (ISOVUE-300) INJECTION 61%
125.0000 mL | Freq: Once | INTRAVENOUS | Status: AC | PRN
  Administered 2017-12-15: 125 mL via INTRAVENOUS

## 2017-12-19 ENCOUNTER — Other Ambulatory Visit (HOSPITAL_COMMUNITY): Payer: Self-pay | Admitting: Gastroenterology

## 2017-12-19 DIAGNOSIS — D499 Neoplasm of unspecified behavior of unspecified site: Secondary | ICD-10-CM

## 2017-12-20 DIAGNOSIS — D3A012 Benign carcinoid tumor of the ileum: Secondary | ICD-10-CM | POA: Diagnosis not present

## 2017-12-26 DIAGNOSIS — Z48298 Encounter for aftercare following other organ transplant: Secondary | ICD-10-CM | POA: Diagnosis not present

## 2017-12-26 DIAGNOSIS — Z905 Acquired absence of kidney: Secondary | ICD-10-CM | POA: Diagnosis not present

## 2017-12-26 DIAGNOSIS — D3A012 Benign carcinoid tumor of the ileum: Secondary | ICD-10-CM | POA: Diagnosis not present

## 2017-12-26 DIAGNOSIS — Z940 Kidney transplant status: Secondary | ICD-10-CM | POA: Diagnosis not present

## 2017-12-26 DIAGNOSIS — K769 Liver disease, unspecified: Secondary | ICD-10-CM | POA: Diagnosis not present

## 2017-12-26 DIAGNOSIS — Z418 Encounter for other procedures for purposes other than remedying health state: Secondary | ICD-10-CM | POA: Diagnosis not present

## 2017-12-26 DIAGNOSIS — Z9483 Pancreas transplant status: Secondary | ICD-10-CM | POA: Diagnosis not present

## 2017-12-27 DIAGNOSIS — D3A8 Other benign neuroendocrine tumors: Secondary | ICD-10-CM | POA: Insufficient documentation

## 2017-12-28 DIAGNOSIS — D3A8 Other benign neuroendocrine tumors: Secondary | ICD-10-CM | POA: Diagnosis not present

## 2018-01-01 ENCOUNTER — Encounter (HOSPITAL_COMMUNITY): Payer: Self-pay

## 2018-01-01 ENCOUNTER — Ambulatory Visit (HOSPITAL_COMMUNITY): Payer: BLUE CROSS/BLUE SHIELD

## 2018-01-22 DIAGNOSIS — D3A012 Benign carcinoid tumor of the ileum: Secondary | ICD-10-CM | POA: Diagnosis not present

## 2018-01-22 DIAGNOSIS — D3A8 Other benign neuroendocrine tumors: Secondary | ICD-10-CM | POA: Diagnosis not present

## 2018-01-23 DIAGNOSIS — K5730 Diverticulosis of large intestine without perforation or abscess without bleeding: Secondary | ICD-10-CM | POA: Diagnosis not present

## 2018-01-23 DIAGNOSIS — K769 Liver disease, unspecified: Secondary | ICD-10-CM | POA: Diagnosis not present

## 2018-01-23 DIAGNOSIS — C7A8 Other malignant neuroendocrine tumors: Secondary | ICD-10-CM | POA: Diagnosis not present

## 2018-01-23 DIAGNOSIS — Z940 Kidney transplant status: Secondary | ICD-10-CM | POA: Diagnosis not present

## 2018-01-23 DIAGNOSIS — D3A8 Other benign neuroendocrine tumors: Secondary | ICD-10-CM | POA: Diagnosis not present

## 2018-01-29 DIAGNOSIS — M109 Gout, unspecified: Secondary | ICD-10-CM | POA: Diagnosis not present

## 2018-01-29 DIAGNOSIS — Z940 Kidney transplant status: Secondary | ICD-10-CM | POA: Diagnosis not present

## 2018-01-29 DIAGNOSIS — I129 Hypertensive chronic kidney disease with stage 1 through stage 4 chronic kidney disease, or unspecified chronic kidney disease: Secondary | ICD-10-CM | POA: Diagnosis not present

## 2018-02-15 DIAGNOSIS — Z01818 Encounter for other preprocedural examination: Secondary | ICD-10-CM | POA: Diagnosis not present

## 2018-02-15 DIAGNOSIS — N186 End stage renal disease: Secondary | ICD-10-CM | POA: Diagnosis not present

## 2018-02-15 DIAGNOSIS — E1122 Type 2 diabetes mellitus with diabetic chronic kidney disease: Secondary | ICD-10-CM | POA: Diagnosis not present

## 2018-02-15 DIAGNOSIS — Z940 Kidney transplant status: Secondary | ICD-10-CM | POA: Diagnosis not present

## 2018-02-15 DIAGNOSIS — E785 Hyperlipidemia, unspecified: Secondary | ICD-10-CM | POA: Diagnosis not present

## 2018-02-15 DIAGNOSIS — D3A8 Other benign neuroendocrine tumors: Secondary | ICD-10-CM | POA: Diagnosis not present

## 2018-02-15 DIAGNOSIS — Z905 Acquired absence of kidney: Secondary | ICD-10-CM | POA: Diagnosis not present

## 2018-02-15 DIAGNOSIS — I120 Hypertensive chronic kidney disease with stage 5 chronic kidney disease or end stage renal disease: Secondary | ICD-10-CM | POA: Diagnosis not present

## 2018-02-22 DIAGNOSIS — Z418 Encounter for other procedures for purposes other than remedying health state: Secondary | ICD-10-CM | POA: Diagnosis not present

## 2018-02-22 DIAGNOSIS — Z48298 Encounter for aftercare following other organ transplant: Secondary | ICD-10-CM | POA: Diagnosis not present

## 2018-02-22 DIAGNOSIS — Z940 Kidney transplant status: Secondary | ICD-10-CM | POA: Diagnosis not present

## 2018-02-22 DIAGNOSIS — Z9483 Pancreas transplant status: Secondary | ICD-10-CM | POA: Diagnosis not present

## 2018-03-30 DIAGNOSIS — E7800 Pure hypercholesterolemia, unspecified: Secondary | ICD-10-CM | POA: Diagnosis not present

## 2018-03-30 DIAGNOSIS — M109 Gout, unspecified: Secondary | ICD-10-CM | POA: Diagnosis not present

## 2018-03-30 DIAGNOSIS — R739 Hyperglycemia, unspecified: Secondary | ICD-10-CM | POA: Diagnosis not present

## 2018-03-30 DIAGNOSIS — Z940 Kidney transplant status: Secondary | ICD-10-CM | POA: Diagnosis not present

## 2018-03-30 DIAGNOSIS — I129 Hypertensive chronic kidney disease with stage 1 through stage 4 chronic kidney disease, or unspecified chronic kidney disease: Secondary | ICD-10-CM | POA: Diagnosis not present

## 2018-04-02 DIAGNOSIS — R739 Hyperglycemia, unspecified: Secondary | ICD-10-CM | POA: Diagnosis not present

## 2018-04-02 DIAGNOSIS — Z940 Kidney transplant status: Secondary | ICD-10-CM | POA: Diagnosis not present

## 2018-04-02 DIAGNOSIS — Z79899 Other long term (current) drug therapy: Secondary | ICD-10-CM | POA: Diagnosis not present

## 2018-04-02 DIAGNOSIS — I129 Hypertensive chronic kidney disease with stage 1 through stage 4 chronic kidney disease, or unspecified chronic kidney disease: Secondary | ICD-10-CM | POA: Diagnosis not present

## 2018-04-04 DIAGNOSIS — I10 Essential (primary) hypertension: Secondary | ICD-10-CM | POA: Diagnosis not present

## 2018-04-04 DIAGNOSIS — E049 Nontoxic goiter, unspecified: Secondary | ICD-10-CM | POA: Diagnosis not present

## 2018-04-04 DIAGNOSIS — E7800 Pure hypercholesterolemia, unspecified: Secondary | ICD-10-CM | POA: Diagnosis not present

## 2018-04-06 ENCOUNTER — Other Ambulatory Visit: Payer: Self-pay | Admitting: Family Medicine

## 2018-04-06 ENCOUNTER — Other Ambulatory Visit: Payer: BLUE CROSS/BLUE SHIELD

## 2018-04-06 ENCOUNTER — Ambulatory Visit
Admission: RE | Admit: 2018-04-06 | Discharge: 2018-04-06 | Disposition: A | Discharge status: Home / Self Care | Payer: BLUE CROSS/BLUE SHIELD | Source: Ambulatory Visit | Attending: Family Medicine | Admitting: Family Medicine

## 2018-04-06 ENCOUNTER — Other Ambulatory Visit: Payer: Self-pay

## 2018-04-06 DIAGNOSIS — N6310 Unspecified lump in the right breast, unspecified quadrant: Secondary | ICD-10-CM | POA: Diagnosis not present

## 2018-04-30 DIAGNOSIS — Z940 Kidney transplant status: Secondary | ICD-10-CM | POA: Diagnosis not present

## 2018-04-30 DIAGNOSIS — E109 Type 1 diabetes mellitus without complications: Secondary | ICD-10-CM | POA: Diagnosis not present

## 2018-04-30 DIAGNOSIS — Z48298 Encounter for aftercare following other organ transplant: Secondary | ICD-10-CM | POA: Diagnosis not present

## 2018-04-30 DIAGNOSIS — E119 Type 2 diabetes mellitus without complications: Secondary | ICD-10-CM | POA: Diagnosis not present

## 2018-05-11 HISTORY — PX: COLON RESECTION: SHX5231

## 2018-05-15 DIAGNOSIS — D1723 Benign lipomatous neoplasm of skin and subcutaneous tissue of right leg: Secondary | ICD-10-CM | POA: Diagnosis not present

## 2018-05-20 DIAGNOSIS — I10 Essential (primary) hypertension: Secondary | ICD-10-CM | POA: Diagnosis not present

## 2018-05-20 DIAGNOSIS — D3A8 Other benign neuroendocrine tumors: Secondary | ICD-10-CM | POA: Diagnosis not present

## 2018-05-20 DIAGNOSIS — K660 Peritoneal adhesions (postprocedural) (postinfection): Secondary | ICD-10-CM | POA: Diagnosis not present

## 2018-05-20 DIAGNOSIS — Z881 Allergy status to other antibiotic agents status: Secondary | ICD-10-CM | POA: Diagnosis not present

## 2018-05-20 DIAGNOSIS — E872 Acidosis: Secondary | ICD-10-CM | POA: Diagnosis not present

## 2018-05-20 DIAGNOSIS — Z7984 Long term (current) use of oral hypoglycemic drugs: Secondary | ICD-10-CM | POA: Diagnosis not present

## 2018-05-20 DIAGNOSIS — E119 Type 2 diabetes mellitus without complications: Secondary | ICD-10-CM | POA: Diagnosis not present

## 2018-05-20 DIAGNOSIS — Z794 Long term (current) use of insulin: Secondary | ICD-10-CM | POA: Diagnosis not present

## 2018-05-20 DIAGNOSIS — K430 Incisional hernia with obstruction, without gangrene: Secondary | ICD-10-CM | POA: Diagnosis not present

## 2018-05-20 DIAGNOSIS — G8918 Other acute postprocedural pain: Secondary | ICD-10-CM | POA: Diagnosis not present

## 2018-05-20 DIAGNOSIS — Z905 Acquired absence of kidney: Secondary | ICD-10-CM | POA: Diagnosis not present

## 2018-05-20 DIAGNOSIS — Z1159 Encounter for screening for other viral diseases: Secondary | ICD-10-CM | POA: Diagnosis not present

## 2018-05-20 DIAGNOSIS — D899 Disorder involving the immune mechanism, unspecified: Secondary | ICD-10-CM | POA: Diagnosis not present

## 2018-05-20 DIAGNOSIS — Z90710 Acquired absence of both cervix and uterus: Secondary | ICD-10-CM | POA: Diagnosis not present

## 2018-05-20 DIAGNOSIS — Z940 Kidney transplant status: Secondary | ICD-10-CM | POA: Diagnosis not present

## 2018-05-20 DIAGNOSIS — Z79899 Other long term (current) drug therapy: Secondary | ICD-10-CM | POA: Diagnosis not present

## 2018-05-20 DIAGNOSIS — Z7982 Long term (current) use of aspirin: Secondary | ICD-10-CM | POA: Diagnosis not present

## 2018-05-23 DIAGNOSIS — K660 Peritoneal adhesions (postprocedural) (postinfection): Secondary | ICD-10-CM | POA: Diagnosis not present

## 2018-05-23 DIAGNOSIS — K430 Incisional hernia with obstruction, without gangrene: Secondary | ICD-10-CM | POA: Diagnosis not present

## 2018-05-23 DIAGNOSIS — E872 Acidosis: Secondary | ICD-10-CM | POA: Diagnosis not present

## 2018-05-23 DIAGNOSIS — D3A8 Other benign neuroendocrine tumors: Secondary | ICD-10-CM | POA: Diagnosis not present

## 2018-05-23 DIAGNOSIS — E119 Type 2 diabetes mellitus without complications: Secondary | ICD-10-CM | POA: Diagnosis not present

## 2018-05-23 DIAGNOSIS — G8918 Other acute postprocedural pain: Secondary | ICD-10-CM | POA: Diagnosis not present

## 2018-05-23 DIAGNOSIS — Z940 Kidney transplant status: Secondary | ICD-10-CM | POA: Diagnosis not present

## 2018-05-24 DIAGNOSIS — Z940 Kidney transplant status: Secondary | ICD-10-CM | POA: Diagnosis not present

## 2018-05-24 DIAGNOSIS — G8918 Other acute postprocedural pain: Secondary | ICD-10-CM | POA: Diagnosis not present

## 2018-05-24 DIAGNOSIS — D899 Disorder involving the immune mechanism, unspecified: Secondary | ICD-10-CM | POA: Diagnosis not present

## 2018-05-24 DIAGNOSIS — D3A8 Other benign neuroendocrine tumors: Secondary | ICD-10-CM | POA: Diagnosis not present

## 2018-06-07 ENCOUNTER — Other Ambulatory Visit: Payer: Self-pay

## 2018-06-07 ENCOUNTER — Encounter: Payer: Self-pay | Admitting: Family Medicine

## 2018-06-07 ENCOUNTER — Ambulatory Visit (INDEPENDENT_AMBULATORY_CARE_PROVIDER_SITE_OTHER): Payer: BLUE CROSS/BLUE SHIELD | Admitting: Family Medicine

## 2018-06-07 VITALS — BP 108/78 | HR 115 | Temp 100.9°F | Ht 65.5 in | Wt 284.0 lb

## 2018-06-07 DIAGNOSIS — N2889 Other specified disorders of kidney and ureter: Secondary | ICD-10-CM | POA: Diagnosis not present

## 2018-06-07 DIAGNOSIS — E785 Hyperlipidemia, unspecified: Secondary | ICD-10-CM | POA: Diagnosis not present

## 2018-06-07 DIAGNOSIS — R509 Fever, unspecified: Secondary | ICD-10-CM

## 2018-06-07 DIAGNOSIS — Z881 Allergy status to other antibiotic agents status: Secondary | ICD-10-CM | POA: Diagnosis not present

## 2018-06-07 DIAGNOSIS — Z87440 Personal history of urinary (tract) infections: Secondary | ICD-10-CM | POA: Diagnosis not present

## 2018-06-07 DIAGNOSIS — Z79899 Other long term (current) drug therapy: Secondary | ICD-10-CM | POA: Diagnosis not present

## 2018-06-07 DIAGNOSIS — R112 Nausea with vomiting, unspecified: Secondary | ICD-10-CM | POA: Diagnosis not present

## 2018-06-07 DIAGNOSIS — N12 Tubulo-interstitial nephritis, not specified as acute or chronic: Secondary | ICD-10-CM

## 2018-06-07 DIAGNOSIS — N179 Acute kidney failure, unspecified: Secondary | ICD-10-CM | POA: Diagnosis not present

## 2018-06-07 DIAGNOSIS — R0902 Hypoxemia: Secondary | ICD-10-CM | POA: Diagnosis not present

## 2018-06-07 DIAGNOSIS — I10 Essential (primary) hypertension: Secondary | ICD-10-CM | POA: Diagnosis not present

## 2018-06-07 DIAGNOSIS — D3A8 Other benign neuroendocrine tumors: Secondary | ICD-10-CM | POA: Diagnosis not present

## 2018-06-07 DIAGNOSIS — T8613 Kidney transplant infection: Secondary | ICD-10-CM | POA: Diagnosis not present

## 2018-06-07 DIAGNOSIS — K838 Other specified diseases of biliary tract: Secondary | ICD-10-CM | POA: Diagnosis not present

## 2018-06-07 DIAGNOSIS — I959 Hypotension, unspecified: Secondary | ICD-10-CM | POA: Diagnosis not present

## 2018-06-07 DIAGNOSIS — E119 Type 2 diabetes mellitus without complications: Secondary | ICD-10-CM | POA: Diagnosis not present

## 2018-06-07 DIAGNOSIS — K760 Fatty (change of) liver, not elsewhere classified: Secondary | ICD-10-CM | POA: Diagnosis not present

## 2018-06-07 DIAGNOSIS — E139 Other specified diabetes mellitus without complications: Secondary | ICD-10-CM | POA: Insufficient documentation

## 2018-06-07 DIAGNOSIS — R109 Unspecified abdominal pain: Secondary | ICD-10-CM | POA: Diagnosis not present

## 2018-06-07 DIAGNOSIS — R300 Dysuria: Secondary | ICD-10-CM

## 2018-06-07 DIAGNOSIS — N390 Urinary tract infection, site not specified: Secondary | ICD-10-CM | POA: Diagnosis not present

## 2018-06-07 DIAGNOSIS — Q613 Polycystic kidney, unspecified: Secondary | ICD-10-CM | POA: Diagnosis not present

## 2018-06-07 DIAGNOSIS — Z940 Kidney transplant status: Secondary | ICD-10-CM | POA: Diagnosis not present

## 2018-06-07 DIAGNOSIS — Z209 Contact with and (suspected) exposure to unspecified communicable disease: Secondary | ICD-10-CM | POA: Diagnosis not present

## 2018-06-07 DIAGNOSIS — T8619 Other complication of kidney transplant: Secondary | ICD-10-CM | POA: Diagnosis not present

## 2018-06-07 DIAGNOSIS — Z905 Acquired absence of kidney: Secondary | ICD-10-CM | POA: Diagnosis not present

## 2018-06-07 DIAGNOSIS — B372 Candidiasis of skin and nail: Secondary | ICD-10-CM | POA: Diagnosis not present

## 2018-06-07 DIAGNOSIS — Z7982 Long term (current) use of aspirin: Secondary | ICD-10-CM | POA: Diagnosis not present

## 2018-06-07 DIAGNOSIS — Z91048 Other nonmedicinal substance allergy status: Secondary | ICD-10-CM | POA: Diagnosis not present

## 2018-06-07 DIAGNOSIS — Z794 Long term (current) use of insulin: Secondary | ICD-10-CM | POA: Diagnosis not present

## 2018-06-07 DIAGNOSIS — K7689 Other specified diseases of liver: Secondary | ICD-10-CM | POA: Diagnosis not present

## 2018-06-07 LAB — POCT URINALYSIS DIPSTICK
Bilirubin, UA: NEGATIVE
Glucose, UA: NEGATIVE
Ketones, UA: NEGATIVE
Nitrite, UA: POSITIVE
Protein, UA: POSITIVE — AB
Spec Grav, UA: 1.025 (ref 1.010–1.025)
Urobilinogen, UA: 0.2 E.U./dL
pH, UA: 6 (ref 5.0–8.0)

## 2018-06-07 MED ORDER — ONDANSETRON HCL 4 MG PO TABS
4.0000 mg | ORAL_TABLET | Freq: Once | ORAL | Status: AC
  Administered 2018-06-07: 4 mg via ORAL

## 2018-06-07 MED ORDER — CEFTRIAXONE SODIUM 1 G IJ SOLR
1.0000 g | Freq: Once | INTRAMUSCULAR | Status: AC
  Administered 2018-06-07: 1 g via INTRAMUSCULAR

## 2018-06-07 MED ORDER — ACETAMINOPHEN 325 MG PO TABS
1000.0000 mg | ORAL_TABLET | Freq: Once | ORAL | Status: AC
  Administered 2018-06-07: 13:00:00 975 mg via ORAL

## 2018-06-07 NOTE — Progress Notes (Signed)
Patient: [name] [name] MRN: [id number] DOB: [birth year] PCP: Lawerance Cruel, MD     Subjective:  Chief Complaint  Patient presents with  . poss UTI    HPI: The patient is a 53 y.o. female who presents today for possible UTI. She walked into clinic, is a new patient. History of polycystic kidney disease s/p transplant in 2013. 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 Also has hx of diabetes.    She started to have symptoms about 2 days ago with urinary frequency. Yesterday afternoon started to have chills and fever. Last night was 101.8. She took tylenol last night. She does have back pain which is typical when she gets an urine infection. She denies any dysuria, but does have urgency and odor to urine. No color change in her urine and no visible blood in her urine. She has no hx of kidney stones. She has no hx of frequent urine infections. She had one UTI about 1.5 years ago. No issues except for that from her transplant. 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 Has been in touch with [facility]. No cough, SOB. Wounds are clean with no redness/drainage. She denies any N/V and has only drank water today.   Review of Systems  Constitutional: Positive for chills, fatigue and fever.       Recent surgery on 5/13.  Pt states that she typically gets a fever w/sx of UTI  Respiratory: Negative for shortness of breath.   Cardiovascular: Negative for chest pain.  Gastrointestinal: Negative for abdominal pain and nausea.  Genitourinary: Positive for flank pain, frequency and urgency. Negative for dysuria, hematuria and pelvic pain.  Musculoskeletal: Positive for back pain.       C/o lower back pain and skin sensitivity in lower back  Neurological: Negative for dizziness and headaches.    Allergies Patient is allergic to vancomycin and tape.  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  Surgical History Patient  has a past surgical history that includes Cesarean section (1995 and 1997); Abdominal hysterectomy (2000?); Cataract extraction (2011); Peritoneal catheter insertion; Ventral hernia repair (07/04/2011); Hernia repair (2005); Hernia repair (2013); Nephrectomy transplanted organ; Colonoscopy (N/A, 12/05/2017); and biopsy (12/05/2017).  Family History Pateint's family history includes Cancer (age of onset: 14) in her sister; Cerebral aneurysm in her father; Hypertension in her brother and father; Polycystic kidney disease in her brother and father; Stroke in her father.  Social History Patient  reports that she has never smoked. She has never used smokeless tobacco. She reports that she does not drink alcohol or use drugs.    Objective: Vitals:   06/07/18 1030  BP: 108/78  Pulse: (!) 115  Temp: (!) 100.9 F (38.3 C)  TempSrc: Oral  SpO2: 98%  Weight: 284 lb (128.8 kg)  Height: 5' 5.5" (1.664 m)    Body mass index is 46.54 kg/m.  Physical Exam Vitals signs reviewed.  Constitutional:      Appearance: She is obese. She is ill-appearing and diaphoretic.  Cardiovascular:     Rate and Rhythm: Regular rhythm. Tachycardia present.     Heart sounds: Normal heart sounds. No murmur.  Pulmonary:     Effort: Pulmonary effort is normal.     Breath sounds: Normal breath sounds.  Abdominal:     Tenderness:  There is abdominal tenderness. There is right CVA tenderness and left CVA tenderness. There is no guarding or rebound.     Comments: Diffuse abdominal tenderness that made her vomit after I palpated. No rebound or guarding. Severe TTP over bilateral CVA.   Skin:    Coloration: Skin is pale.     Comments: Pale and diaphoretic.  Incisions healing nicely. No erythema, drainage.   Neurological:      General: No focal deficit present.     Mental Status: She is alert and oriented to person, place, and time.        Assessment/plan: 1. Pyelonephritis EMS called. Husband not comfortable driving her to ER. Concern for pyelo/sepsis and complicated UTI with recent surgery. Rocephin given in office. Culture obtained prior to abx. Also concern for possible abscess issue following surgery and needs scanned. EMS to transport.  - cefTRIAXone (ROCEPHIN) injection 1 g - acetaminophen (TYLENOL) tablet 975 mg - ondansetron (ZOFRAN) tablet 4 mg  2. Dysuria See above.  - POCT Urinalysis Dipstick - Urine Culture  3. Fever and chills Tylenol given in office. 1000mg .     Return if symptoms worsen or fail to improve.   Orma Flaming, MD [place]   06/07/2018

## 2018-06-07 NOTE — Patient Instructions (Signed)
Concern for pyelo and infection post surgery. Need ER eval and imaging.   Urine culture done and will be visible in care everywhere Tylenol 1000mg  for fever and zofran 4mg   Rocephin 1g given in office.   ER.Marland KitchenMarland KitchenMarland Kitchen

## 2018-06-08 DIAGNOSIS — D3A8 Other benign neuroendocrine tumors: Secondary | ICD-10-CM | POA: Diagnosis not present

## 2018-06-08 DIAGNOSIS — R109 Unspecified abdominal pain: Secondary | ICD-10-CM | POA: Diagnosis not present

## 2018-06-08 DIAGNOSIS — R300 Dysuria: Secondary | ICD-10-CM | POA: Diagnosis not present

## 2018-06-08 DIAGNOSIS — N179 Acute kidney failure, unspecified: Secondary | ICD-10-CM | POA: Diagnosis not present

## 2018-06-08 DIAGNOSIS — I10 Essential (primary) hypertension: Secondary | ICD-10-CM | POA: Diagnosis not present

## 2018-06-08 DIAGNOSIS — Z940 Kidney transplant status: Secondary | ICD-10-CM | POA: Diagnosis not present

## 2018-06-08 DIAGNOSIS — R509 Fever, unspecified: Secondary | ICD-10-CM | POA: Diagnosis not present

## 2018-06-08 LAB — URINE CULTURE
MICRO NUMBER:: 514624
SPECIMEN QUALITY:: ADEQUATE

## 2018-06-09 DIAGNOSIS — R509 Fever, unspecified: Secondary | ICD-10-CM | POA: Diagnosis not present

## 2018-06-09 DIAGNOSIS — N179 Acute kidney failure, unspecified: Secondary | ICD-10-CM | POA: Diagnosis not present

## 2018-06-09 DIAGNOSIS — I10 Essential (primary) hypertension: Secondary | ICD-10-CM | POA: Diagnosis not present

## 2018-06-09 DIAGNOSIS — Z940 Kidney transplant status: Secondary | ICD-10-CM | POA: Diagnosis not present

## 2018-06-12 DIAGNOSIS — D3A8 Other benign neuroendocrine tumors: Secondary | ICD-10-CM | POA: Diagnosis not present

## 2018-06-13 ENCOUNTER — Ambulatory Visit (INDEPENDENT_AMBULATORY_CARE_PROVIDER_SITE_OTHER): Payer: BC Managed Care – PPO | Admitting: Family Medicine

## 2018-06-13 ENCOUNTER — Other Ambulatory Visit: Payer: Self-pay

## 2018-06-13 ENCOUNTER — Encounter: Payer: Self-pay | Admitting: Family Medicine

## 2018-06-13 VITALS — BP 128/80 | HR 80 | Temp 98.1°F | Ht 65.5 in | Wt 287.8 lb

## 2018-06-13 DIAGNOSIS — N12 Tubulo-interstitial nephritis, not specified as acute or chronic: Secondary | ICD-10-CM

## 2018-06-13 DIAGNOSIS — F5102 Adjustment insomnia: Secondary | ICD-10-CM | POA: Diagnosis not present

## 2018-06-13 DIAGNOSIS — Z114 Encounter for screening for human immunodeficiency virus [HIV]: Secondary | ICD-10-CM

## 2018-06-13 DIAGNOSIS — E119 Type 2 diabetes mellitus without complications: Secondary | ICD-10-CM

## 2018-06-13 DIAGNOSIS — Z794 Long term (current) use of insulin: Secondary | ICD-10-CM

## 2018-06-13 LAB — COMPREHENSIVE METABOLIC PANEL
ALT: 24 U/L (ref 0–35)
AST: 18 U/L (ref 0–37)
Albumin: 3.6 g/dL (ref 3.5–5.2)
Alkaline Phosphatase: 63 U/L (ref 39–117)
BUN: 18 mg/dL (ref 6–23)
CO2: 22 mEq/L (ref 19–32)
Calcium: 8.7 mg/dL (ref 8.4–10.5)
Chloride: 106 mEq/L (ref 96–112)
Creatinine, Ser: 1.06 mg/dL (ref 0.40–1.20)
GFR: 54.23 mL/min — ABNORMAL LOW (ref >60.00)
Glucose, Bld: 125 mg/dL — ABNORMAL HIGH (ref 70–99)
Potassium: 4.2 mEq/L (ref 3.5–5.1)
Sodium: 140 mEq/L (ref 135–145)
Total Bilirubin: 0.5 mg/dL (ref 0.2–1.2)
Total Protein: 6 g/dL (ref 6.0–8.3)

## 2018-06-13 LAB — CBC WITH DIFFERENTIAL/PLATELET
Basophils Absolute: 0.1 10*3/uL (ref 0.0–0.1)
Basophils Relative: 0.8 % (ref 0.0–3.0)
Eosinophils Absolute: 0.3 10*3/uL (ref 0.0–0.7)
Eosinophils Relative: 4.5 % (ref 0.0–5.0)
HCT: 37.5 % (ref 36.0–46.0)
Hemoglobin: 12.4 g/dL (ref 12.0–15.0)
Lymphocytes Relative: 13.4 % (ref 12.0–46.0)
Lymphs Abs: 0.8 10*3/uL (ref 0.7–4.0)
MCHC: 33.1 g/dL (ref 30.0–36.0)
MCV: 85 fl (ref 78.0–100.0)
Monocytes Absolute: 0.5 10*3/uL (ref 0.1–1.0)
Monocytes Relative: 7.5 % (ref 3.0–12.0)
Neutro Abs: 4.6 10*3/uL (ref 1.4–7.7)
Neutrophils Relative %: 73.8 % (ref 43.0–77.0)
Platelets: 300 10*3/uL (ref 150.0–400.0)
RBC: 4.41 Mil/uL (ref 3.87–5.11)
RDW: 13.7 % (ref 11.5–15.5)
WBC: 6.3 10*3/uL (ref 4.0–10.5)

## 2018-06-13 LAB — POCT URINALYSIS DIP (MANUAL ENTRY)
Bilirubin, UA: NEGATIVE
Blood, UA: NEGATIVE
Glucose, UA: NEGATIVE mg/dL
Ketones, POC UA: NEGATIVE mg/dL
Leukocytes, UA: NEGATIVE
Nitrite, UA: NEGATIVE
Protein Ur, POC: NEGATIVE mg/dL
Spec Grav, UA: 1.02 (ref 1.010–1.025)
Urobilinogen, UA: 0.2 E.U./dL
pH, UA: 6 (ref 5.0–8.0)

## 2018-06-13 LAB — LIPID PANEL
Cholesterol: 111 mg/dL (ref 0–200)
HDL: 33.4 mg/dL — ABNORMAL LOW (ref >39.00)
LDL Cholesterol: 57 mg/dL (ref 0–99)
NonHDL: 77.75
Total CHOL/HDL Ratio: 3
Triglycerides: 105 mg/dL (ref 0.0–149.0)
VLDL: 21 mg/dL (ref 0.0–40.0)

## 2018-06-13 MED ORDER — CIPROFLOXACIN HCL 500 MG PO TABS: 500.0000 mg | ORAL_TABLET | Freq: Two times a day (BID) | ORAL | 0 refills | Status: DC

## 2018-06-13 NOTE — Progress Notes (Signed)
Patient: [name] [name] MRN: [id number] DOB: 08-Apr-1965 PCP: Orma Flaming, MD     Subjective:  Chief Complaint  Patient presents with  . Establish Care  . Hospitalization Follow-up    HPI: The patient is a 53 y.o. female who presents today for hospital follow up for pyelonephritis and establishing care. I saw her last Thursday as a new patient and she had acute pyelo after surgery and I sent her back to ER. She was admitted for 3 days.   Admit date: 06/07/2018 Discharge date: 06/09/2018 Diagnosis: pyelonephritis.   She was seen at [facility]. I have labs, but no hospital discharge summary. From patient she had ultrasound which I can see, no CT scan done. IV fluids given and IV antibiotics. She is currently on PO cipro that was started Saturday and takes 500mg  BID.  She is taking abx as prescribed. Her back pain is much improved. Her skin pain is much improved as well. She is urinating fine with no symptoms. She is eating and drinking normally. No fever/chills. Renal ultrasound done as well-mild pelviectasis and proximal ureterectasis, otherwise normal. Creatinine elevated to 1.4 on 5/29. Blood cultures negative x 5 days.   She has a medical history of type 2 diabetes followed by endocrinology and is currently on novolog 70/30 and tradjenta. A1c checked in hospital and was 7.1.   History of PCKD s/p kidney transplant in 2013. On lifelong immunosuppressants and followed by Dr. Lemar Livings at [facility]. Still followed in [place] as well where she had her transplant surgery done.   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 Followed at [facility] and seeing Oncology.   Having issues sleeping. Never had an issue before her surgeries. Trying not to nap. Thinks it may be due to more anesthesia, staying in hospital and not working.   Mmg: 03/2018: mmg done. And repeat in 09/2018 Colonoscopy: 11/2017. Found cancer. Repeat cscope 2021 Pap smear: no longer  needs. S/p hysterectomy  Hiv: today. Has had done in past with transplant.  Shots: has had pneumonia shot and tetanus shot. Will hopefully get records.   Review of Systems  Constitutional: Positive for fatigue. Negative for fever.  Gastrointestinal: Negative for abdominal pain, nausea and vomiting.  Genitourinary: Negative for dysuria, flank pain, hematuria and urgency.  Musculoskeletal: Positive for back pain. Negative for neck pain.  Skin:       C/o "skin tenderness" on lower back  Neurological: Negative for dizziness and headaches.  Psychiatric/Behavioral: Positive for sleep disturbance.    Allergies Patient is allergic to vancomycin and tape.  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  Surgical History Patient  has a past surgical history that includes Cesarean section (1995 and 1997); Abdominal hysterectomy (2000?); Cataract extraction (2011); Peritoneal catheter insertion; Ventral hernia repair (07/04/2011); Hernia repair (2005); Hernia repair (2013); Nephrectomy transplanted organ; Colonoscopy (N/A, 12/05/2017); and biopsy (12/05/2017).  Family History Pateint's family history includes Cancer (age of onset: 67) in her sister; Cerebral aneurysm in her father; Hypertension in her brother and father; Polycystic kidney disease in her brother and father; Stroke in her father.  Social History Patient  reports that she has never smoked. She has never used smokeless tobacco. She reports that she does not drink alcohol or use drugs.    Objective: Vitals:   06/13/18 0820  BP: 128/80  Pulse: 80  Temp: 98.1 F (36.7 C)  TempSrc: Oral  SpO2: 98%  Weight: 287 lb 12.8 oz (130.5 kg)  Height: 5' 5.5" (1.664 m)    Body mass index is 47.16 kg/m.  Physical  Exam Vitals signs reviewed.  Constitutional:      Appearance: She is obese.  HENT:     Head: Normocephalic and atraumatic.     Right Ear: Tympanic membrane, ear canal and external ear normal.     Left Ear: Tympanic membrane, ear canal and external ear normal.     Nose: Nose normal.     Mouth/Throat:     Mouth: Mucous membranes are moist.  Eyes:     Extraocular Movements: Extraocular movements intact.     Pupils: Pupils are equal, round, and reactive to light.  Neck:     Musculoskeletal: Normal range of motion and neck supple.  Cardiovascular:     Rate and Rhythm: Normal rate and regular rhythm.  Pulmonary:     Effort: Pulmonary effort is normal.     Breath sounds: Normal breath sounds.  Abdominal:     General: Abdomen is flat. Bowel sounds are normal. There is no distension.     Palpations: Abdomen is soft.     Tenderness: There is no abdominal tenderness. There is no right CVA tenderness or left CVA tenderness.  Skin:    General: Skin is warm and dry.     Capillary Refill: Capillary refill takes less than 2 seconds.  Neurological:     General: No focal deficit present.     Mental Status: She is alert and oriented to person, place, and time.  Psychiatric:        Mood and Affect: Mood normal.        Behavior: Behavior normal.        Assessment/plan: 1. Pyelonephritis Resolved. Asymptomatic. Urine clean on dip and taking abx as prescribed. Will check labs again to make sure renal fx, electrolytes back to baseline. Extend cipro to 10 days and 3 more days given.  - Comprehensive metabolic panel - CBC with Differential/Platelet - POCT urinalysis dipstick  2. Diabetes mellitus type 2, insulin dependent ([place]) Followed by endocrine.  - Lipid panel  3. Encounter for screening for HIV  - HIV Antibody (routine testing w rflx)  4. Adjustment insomnia -likely due to recent surgeries, anesthesia, off routine. Sleep hygiene given and would give this a little bit more time.  Hopefully will resolve when back in her routine since she never had an issue before. If persists she is to let me know. Could also try ashwagandha as long as okay with her immunosuppressants. Would check with transplant doctor.   -requesting records for shots/hm as well.   Return in about 1 year (around 06/13/2019).   Orma Flaming, MD [place]   06/13/2018

## 2018-06-13 NOTE — Patient Instructions (Signed)
Please try to incorporate the following into your daily routine:  1. Sleep only long enough to feel rested and then get out of bed  2. Go to bed and get up at the same time every day  3. Do not try to force yourself to sleep. If you can't sleep, get out of bed and try again later.  4. Have coffee, tea, and other foods that have caffeine only in the morning  5. Avoid alcohol in the late afternoon, evening, and bedtime  6. Avoid smoking, especially in the evening  7. Keep your bedroom dark, cool, quiet, and free of reminders of work or other things that cause you stress  8. Solve problems you have before you go to bed  9. Exercise several days a week, but not right before bed  10. Avoid looking at phones or reading devices ("e-books") that give off light before bed. This can make it harder to fall asleep.

## 2018-06-14 LAB — HIV ANTIBODY (ROUTINE TESTING W REFLEX): HIV 1&2 Ab, 4th Generation: NONREACTIVE

## 2018-07-03 DIAGNOSIS — D171 Benign lipomatous neoplasm of skin and subcutaneous tissue of trunk: Secondary | ICD-10-CM | POA: Diagnosis not present

## 2018-07-03 DIAGNOSIS — D485 Neoplasm of uncertain behavior of skin: Secondary | ICD-10-CM | POA: Diagnosis not present

## 2018-07-12 DIAGNOSIS — I129 Hypertensive chronic kidney disease with stage 1 through stage 4 chronic kidney disease, or unspecified chronic kidney disease: Secondary | ICD-10-CM | POA: Diagnosis not present

## 2018-07-12 DIAGNOSIS — Z940 Kidney transplant status: Secondary | ICD-10-CM | POA: Diagnosis not present

## 2018-07-12 DIAGNOSIS — R739 Hyperglycemia, unspecified: Secondary | ICD-10-CM | POA: Diagnosis not present

## 2018-07-12 DIAGNOSIS — E7800 Pure hypercholesterolemia, unspecified: Secondary | ICD-10-CM | POA: Diagnosis not present

## 2018-07-12 DIAGNOSIS — M109 Gout, unspecified: Secondary | ICD-10-CM | POA: Diagnosis not present

## 2018-07-17 LAB — LIPID PANEL
Cholesterol: 121 (ref 0–200)
HDL: 52 (ref 35–70)
LDL Cholesterol: 45
Triglycerides: 120 (ref 40–160)

## 2018-07-17 LAB — HEPATIC FUNCTION PANEL
ALT: 30 (ref 7–35)
AST: 18 (ref 13–35)
Alkaline Phosphatase: 80 (ref 25–125)
Bilirubin, Total: 0.4

## 2018-07-17 LAB — BASIC METABOLIC PANEL
BUN: 15 (ref 4–21)
Creatinine: 1.2 — AB (ref 0.5–1.1)
Glucose: 142
Potassium: 4.3 (ref 3.4–5.3)
Sodium: 140 (ref 137–147)

## 2018-07-17 LAB — HEMOGLOBIN A1C: Hemoglobin A1C: 7.1

## 2018-07-19 DIAGNOSIS — Z79899 Other long term (current) drug therapy: Secondary | ICD-10-CM | POA: Diagnosis not present

## 2018-07-19 DIAGNOSIS — R739 Hyperglycemia, unspecified: Secondary | ICD-10-CM | POA: Diagnosis not present

## 2018-07-19 DIAGNOSIS — Z940 Kidney transplant status: Secondary | ICD-10-CM | POA: Diagnosis not present

## 2018-07-19 DIAGNOSIS — I129 Hypertensive chronic kidney disease with stage 1 through stage 4 chronic kidney disease, or unspecified chronic kidney disease: Secondary | ICD-10-CM | POA: Diagnosis not present

## 2018-07-25 ENCOUNTER — Encounter: Payer: Self-pay | Admitting: Family Medicine

## 2018-07-31 DIAGNOSIS — Z940 Kidney transplant status: Secondary | ICD-10-CM | POA: Diagnosis not present

## 2018-09-11 DIAGNOSIS — K769 Liver disease, unspecified: Secondary | ICD-10-CM | POA: Diagnosis not present

## 2018-09-11 DIAGNOSIS — D3A8 Other benign neuroendocrine tumors: Secondary | ICD-10-CM | POA: Diagnosis not present

## 2018-09-11 DIAGNOSIS — Z23 Encounter for immunization: Secondary | ICD-10-CM | POA: Diagnosis not present

## 2018-10-02 DIAGNOSIS — Z940 Kidney transplant status: Secondary | ICD-10-CM | POA: Diagnosis not present

## 2018-10-02 DIAGNOSIS — I129 Hypertensive chronic kidney disease with stage 1 through stage 4 chronic kidney disease, or unspecified chronic kidney disease: Secondary | ICD-10-CM | POA: Diagnosis not present

## 2018-10-02 DIAGNOSIS — M109 Gout, unspecified: Secondary | ICD-10-CM | POA: Diagnosis not present

## 2018-10-03 DIAGNOSIS — I10 Essential (primary) hypertension: Secondary | ICD-10-CM | POA: Diagnosis not present

## 2018-10-03 DIAGNOSIS — E7800 Pure hypercholesterolemia, unspecified: Secondary | ICD-10-CM | POA: Diagnosis not present

## 2018-10-03 DIAGNOSIS — E1165 Type 2 diabetes mellitus with hyperglycemia: Secondary | ICD-10-CM | POA: Diagnosis not present

## 2018-10-08 ENCOUNTER — Ambulatory Visit
Admission: RE | Admit: 2018-10-08 | Discharge: 2018-10-08 | Disposition: A | Discharge status: Home / Self Care | Payer: BLUE CROSS/BLUE SHIELD | Source: Ambulatory Visit | Attending: Family Medicine | Admitting: Family Medicine

## 2018-10-08 ENCOUNTER — Ambulatory Visit
Admission: RE | Admit: 2018-10-08 | Discharge: 2018-10-08 | Disposition: A | Discharge status: Home / Self Care | Payer: BC Managed Care – PPO | Source: Ambulatory Visit | Attending: Family Medicine | Admitting: Family Medicine

## 2018-10-08 ENCOUNTER — Other Ambulatory Visit: Payer: Self-pay

## 2018-10-08 DIAGNOSIS — N6011 Diffuse cystic mastopathy of right breast: Secondary | ICD-10-CM | POA: Diagnosis not present

## 2018-10-08 DIAGNOSIS — N6310 Unspecified lump in the right breast, unspecified quadrant: Secondary | ICD-10-CM

## 2018-11-20 DIAGNOSIS — Z79899 Other long term (current) drug therapy: Secondary | ICD-10-CM | POA: Diagnosis not present

## 2018-11-20 DIAGNOSIS — I129 Hypertensive chronic kidney disease with stage 1 through stage 4 chronic kidney disease, or unspecified chronic kidney disease: Secondary | ICD-10-CM | POA: Diagnosis not present

## 2018-11-20 DIAGNOSIS — R739 Hyperglycemia, unspecified: Secondary | ICD-10-CM | POA: Diagnosis not present

## 2018-11-20 DIAGNOSIS — Z940 Kidney transplant status: Secondary | ICD-10-CM | POA: Diagnosis not present

## 2018-12-13 DIAGNOSIS — I129 Hypertensive chronic kidney disease with stage 1 through stage 4 chronic kidney disease, or unspecified chronic kidney disease: Secondary | ICD-10-CM | POA: Diagnosis not present

## 2018-12-13 DIAGNOSIS — Z940 Kidney transplant status: Secondary | ICD-10-CM | POA: Diagnosis not present

## 2018-12-13 DIAGNOSIS — M109 Gout, unspecified: Secondary | ICD-10-CM | POA: Diagnosis not present

## 2018-12-13 DIAGNOSIS — E7800 Pure hypercholesterolemia, unspecified: Secondary | ICD-10-CM | POA: Diagnosis not present

## 2018-12-13 DIAGNOSIS — R739 Hyperglycemia, unspecified: Secondary | ICD-10-CM | POA: Diagnosis not present

## 2019-02-13 DIAGNOSIS — Z940 Kidney transplant status: Secondary | ICD-10-CM | POA: Diagnosis not present

## 2019-02-13 DIAGNOSIS — I129 Hypertensive chronic kidney disease with stage 1 through stage 4 chronic kidney disease, or unspecified chronic kidney disease: Secondary | ICD-10-CM | POA: Diagnosis not present

## 2019-02-13 DIAGNOSIS — M109 Gout, unspecified: Secondary | ICD-10-CM | POA: Diagnosis not present

## 2019-03-12 DIAGNOSIS — K7689 Other specified diseases of liver: Secondary | ICD-10-CM | POA: Diagnosis not present

## 2019-03-12 DIAGNOSIS — Z86018 Personal history of other benign neoplasm: Secondary | ICD-10-CM | POA: Diagnosis not present

## 2019-03-12 DIAGNOSIS — Z9049 Acquired absence of other specified parts of digestive tract: Secondary | ICD-10-CM | POA: Diagnosis not present

## 2019-03-12 DIAGNOSIS — D3A8 Other benign neuroendocrine tumors: Secondary | ICD-10-CM | POA: Diagnosis not present

## 2019-03-12 DIAGNOSIS — R5383 Other fatigue: Secondary | ICD-10-CM | POA: Diagnosis not present

## 2019-03-12 DIAGNOSIS — Z09 Encounter for follow-up examination after completed treatment for conditions other than malignant neoplasm: Secondary | ICD-10-CM | POA: Diagnosis not present

## 2019-03-12 DIAGNOSIS — Z940 Kidney transplant status: Secondary | ICD-10-CM | POA: Diagnosis not present

## 2019-03-12 DIAGNOSIS — K5790 Diverticulosis of intestine, part unspecified, without perforation or abscess without bleeding: Secondary | ICD-10-CM | POA: Diagnosis not present

## 2019-03-13 DIAGNOSIS — Z940 Kidney transplant status: Secondary | ICD-10-CM | POA: Diagnosis not present

## 2019-03-14 DIAGNOSIS — E7800 Pure hypercholesterolemia, unspecified: Secondary | ICD-10-CM | POA: Diagnosis not present

## 2019-03-14 DIAGNOSIS — Z940 Kidney transplant status: Secondary | ICD-10-CM | POA: Diagnosis not present

## 2019-03-14 DIAGNOSIS — I129 Hypertensive chronic kidney disease with stage 1 through stage 4 chronic kidney disease, or unspecified chronic kidney disease: Secondary | ICD-10-CM | POA: Diagnosis not present

## 2019-03-14 DIAGNOSIS — R739 Hyperglycemia, unspecified: Secondary | ICD-10-CM | POA: Diagnosis not present

## 2019-05-04 ENCOUNTER — Encounter: Payer: Self-pay | Admitting: Family Medicine

## 2019-05-22 DIAGNOSIS — Z940 Kidney transplant status: Secondary | ICD-10-CM | POA: Diagnosis not present

## 2019-05-22 DIAGNOSIS — Z48298 Encounter for aftercare following other organ transplant: Secondary | ICD-10-CM | POA: Diagnosis not present

## 2019-05-22 DIAGNOSIS — Z298 Encounter for other specified prophylactic measures: Secondary | ICD-10-CM | POA: Diagnosis not present

## 2019-05-24 DIAGNOSIS — Z79899 Other long term (current) drug therapy: Secondary | ICD-10-CM | POA: Diagnosis not present

## 2019-05-24 DIAGNOSIS — I10 Essential (primary) hypertension: Secondary | ICD-10-CM | POA: Diagnosis not present

## 2019-05-24 DIAGNOSIS — E559 Vitamin D deficiency, unspecified: Secondary | ICD-10-CM | POA: Diagnosis not present

## 2019-05-24 DIAGNOSIS — D849 Immunodeficiency, unspecified: Secondary | ICD-10-CM | POA: Diagnosis not present

## 2019-05-24 DIAGNOSIS — Z940 Kidney transplant status: Secondary | ICD-10-CM | POA: Diagnosis not present

## 2019-06-14 ENCOUNTER — Ambulatory Visit (INDEPENDENT_AMBULATORY_CARE_PROVIDER_SITE_OTHER): Payer: BC Managed Care – PPO | Admitting: Family Medicine

## 2019-06-14 ENCOUNTER — Other Ambulatory Visit: Payer: Self-pay

## 2019-06-14 ENCOUNTER — Encounter: Payer: Self-pay | Admitting: Family Medicine

## 2019-06-14 VITALS — BP 132/88 | HR 84 | Temp 97.8°F | Ht 65.5 in | Wt 287.2 lb

## 2019-06-14 DIAGNOSIS — Z794 Long term (current) use of insulin: Secondary | ICD-10-CM | POA: Diagnosis not present

## 2019-06-14 DIAGNOSIS — E119 Type 2 diabetes mellitus without complications: Secondary | ICD-10-CM | POA: Diagnosis not present

## 2019-06-14 DIAGNOSIS — Z0000 Encounter for general adult medical examination without abnormal findings: Secondary | ICD-10-CM

## 2019-06-14 LAB — CBC WITH DIFFERENTIAL/PLATELET
Basophils Absolute: 0 10*3/uL (ref 0.0–0.1)
Basophils Relative: 0.7 % (ref 0.0–3.0)
Eosinophils Absolute: 0.2 10*3/uL (ref 0.0–0.7)
Eosinophils Relative: 2.3 % (ref 0.0–5.0)
HCT: 39.6 % (ref 36.0–46.0)
Hemoglobin: 13.3 g/dL (ref 12.0–15.0)
Lymphocytes Relative: 14 % (ref 12.0–46.0)
Lymphs Abs: 1.1 10*3/uL (ref 0.7–4.0)
MCHC: 33.5 g/dL (ref 30.0–36.0)
MCV: 87.9 fl (ref 78.0–100.0)
Monocytes Absolute: 0.5 10*3/uL (ref 0.1–1.0)
Monocytes Relative: 6.3 % (ref 3.0–12.0)
Neutro Abs: 5.9 10*3/uL (ref 1.4–7.7)
Neutrophils Relative %: 76.7 % (ref 43.0–77.0)
Platelets: 202 10*3/uL (ref 150.0–400.0)
RBC: 4.5 Mil/uL (ref 3.87–5.11)
RDW: 13.5 % (ref 11.5–15.5)
WBC: 7.7 10*3/uL (ref 4.0–10.5)

## 2019-06-14 LAB — LIPID PANEL
Cholesterol: 126 mg/dL (ref 0–200)
HDL: 47.6 mg/dL (ref >39.00)
LDL Cholesterol: 66 mg/dL (ref 0–99)
NonHDL: 78.22
Total CHOL/HDL Ratio: 3
Triglycerides: 62 mg/dL (ref 0.0–149.0)
VLDL: 12.4 mg/dL (ref 0.0–40.0)

## 2019-06-14 LAB — HEMOGLOBIN A1C: Hgb A1c MFr Bld: 8.6 % — ABNORMAL HIGH (ref 4.6–6.5)

## 2019-06-14 LAB — COMPREHENSIVE METABOLIC PANEL
ALT: 28 U/L (ref 0–35)
AST: 17 U/L (ref 0–37)
Albumin: 4.3 g/dL (ref 3.5–5.2)
Alkaline Phosphatase: 87 U/L (ref 39–117)
BUN: 20 mg/dL (ref 6–23)
CO2: 23 mEq/L (ref 19–32)
Calcium: 9.4 mg/dL (ref 8.4–10.5)
Chloride: 104 mEq/L (ref 96–112)
Creatinine, Ser: 1.17 mg/dL (ref 0.40–1.20)
GFR: 48.21 mL/min — ABNORMAL LOW (ref >60.00)
Glucose, Bld: 204 mg/dL — ABNORMAL HIGH (ref 70–99)
Potassium: 4.4 mEq/L (ref 3.5–5.1)
Sodium: 138 mEq/L (ref 135–145)
Total Bilirubin: 0.7 mg/dL (ref 0.2–1.2)
Total Protein: 6.3 g/dL (ref 6.0–8.3)

## 2019-06-14 LAB — MICROALBUMIN / CREATININE URINE RATIO
Creatinine,U: 134.2 mg/dL
Microalb Creat Ratio: 0.6 mg/g (ref 0.0–30.0)
Microalb, Ur: 0.8 mg/dL (ref 0.0–1.9)

## 2019-06-14 LAB — TSH: TSH: 2.17 u[IU]/mL (ref 0.35–4.50)

## 2019-06-14 NOTE — Patient Instructions (Signed)
-look to see if eye doctor retired, if so please call me or email me so I can set you up for this. Need yearly eye exams.   -check to see when tdap vaccine was and I can put in your chart.   So good to see you! You're doing great.    Preventive Care 54-54 Years Old, Female Preventive care refers to visits with your health care provider and lifestyle choices that can promote health and wellness. This includes:  A yearly physical exam. This may also be called an annual well check.  Regular dental visits and eye exams.  Immunizations.  Screening for certain conditions.  Healthy lifestyle choices, such as eating a healthy diet, getting regular exercise, not using drugs or products that contain nicotine and tobacco, and limiting alcohol use. What can I expect for my preventive care visit? Physical exam Your health care provider will check your:  Height and weight. This may be used to calculate body mass index (BMI), which tells if you are at a healthy weight.  Heart rate and blood pressure.  Skin for abnormal spots. Counseling Your health care provider may ask you questions about your:  Alcohol, tobacco, and drug use.  Emotional well-being.  Home and relationship well-being.  Sexual activity.  Eating habits.  Work and work Statistician.  Method of birth control.  Menstrual cycle.  Pregnancy history. What immunizations do I need?  Influenza (flu) vaccine  This is recommended every year. Tetanus, diphtheria, and pertussis (Tdap) vaccine  You may need a Td booster every 10 years. Varicella (chickenpox) vaccine  You may need this if you have not been vaccinated. Zoster (shingles) vaccine  You may need this after age 54. Measles, mumps, and rubella (MMR) vaccine  You may need at least one dose of MMR if you were born in 1957 or later. You may also need a second dose. Pneumococcal conjugate (PCV13) vaccine  You may need this if you have certain conditions and  were not previously vaccinated. Pneumococcal polysaccharide (PPSV23) vaccine  You may need one or two doses if you smoke cigarettes or if you have certain conditions. Meningococcal conjugate (MenACWY) vaccine  You may need this if you have certain conditions. Hepatitis A vaccine  You may need this if you have certain conditions or if you travel or work in places where you may be exposed to hepatitis A. Hepatitis B vaccine  You may need this if you have certain conditions or if you travel or work in places where you may be exposed to hepatitis B. Haemophilus influenzae type b (Hib) vaccine  You may need this if you have certain conditions. Human papillomavirus (HPV) vaccine  If recommended by your health care provider, you may need three doses over 6 months. You may receive vaccines as individual doses or as more than one vaccine together in one shot (combination vaccines). Talk with your health care provider about the risks and benefits of combination vaccines. What tests do I need? Blood tests  Lipid and cholesterol levels. These may be checked every 5 years, or more frequently if you are over 35 years old.  Hepatitis C test.  Hepatitis B test. Screening  Lung cancer screening. You may have this screening every year starting at age 65 if you have a 30-pack-year history of smoking and currently smoke or have quit within the past 15 years.  Colorectal cancer screening. All adults should have this screening starting at age 54 and continuing until age 31. Your health  care provider may recommend screening at age 40 if you are at increased risk. You will have tests every 1-10 years, depending on your results and the type of screening test.  Diabetes screening. This is done by checking your blood sugar (glucose) after you have not eaten for a while (fasting). You may have this done every 1-3 years.  Mammogram. This may be done every 1-2 years. Talk with your health care provider about  when you should start having regular mammograms. This may depend on whether you have a family history of breast cancer.  BRCA-related cancer screening. This may be done if you have a family history of breast, ovarian, tubal, or peritoneal cancers.  Pelvic exam and Pap test. This may be done every 3 years starting at age 54. Starting at age 54, this may be done every 5 years if you have a Pap test in combination with an HPV test. Other tests  Sexually transmitted disease (STD) testing.  Bone density scan. This is done to screen for osteoporosis. You may have this scan if you are at high risk for osteoporosis. Follow these instructions at home: Eating and drinking  Eat a diet that includes fresh fruits and vegetables, whole grains, lean protein, and low-fat dairy.  Take vitamin and mineral supplements as recommended by your health care provider.  Do not drink alcohol if: ? Your health care provider tells you not to drink. ? You are pregnant, may be pregnant, or are planning to become pregnant.  If you drink alcohol: ? Limit how much you have to 0-1 drink a day. ? Be aware of how much alcohol is in your drink. In the [place] one drink equals one 12 oz bottle of beer (355 mL), one 5 oz glass of wine (148 mL), or one 1 oz glass of hard liquor (44 mL). Lifestyle  Take daily care of your teeth and gums.  Stay active. Exercise for at least 30 minutes on 5 or more days each week.  Do not use any products that contain nicotine or tobacco, such as cigarettes, e-cigarettes, and chewing tobacco. If you need help quitting, ask your health care provider.  If you are sexually active, practice safe sex. Use a condom or other form of birth control (contraception) in order to prevent pregnancy and STIs (sexually transmitted infections).  If told by your health care provider, take low-dose aspirin daily starting at age 54. What's next?  Visit your health care provider once a year for a well check  visit.  Ask your health care provider how often you should have your eyes and teeth checked.  Stay up to date on all vaccines. This information is not intended to replace advice given to you by your health care provider. Make sure you discuss any questions you have with your health care provider. Document Revised: 09/07/2017 Document Reviewed: 09/07/2017 Elsevier Patient Education  2020 [employer].

## 2019-06-14 NOTE — Progress Notes (Signed)
Patient: [name] [name] MRN: [id number] DOB: June 28, 1965 PCP: Orma Flaming, MD     Subjective:  Chief Complaint  Patient presents with   Annual Exam    HPI: The patient is a 54 y.o. female who presents today for annual exam. She denies any changes to past medical history. There have been no recent hospitalizations. They are not following a well balanced diet and exercise plan. She says that it has been hard since Covid, and her Physician does not want her to go to the gym. Weight has been stable. No complaints today.   She is utd on her HM. Need to get some records. Has had covid vaccines.   Type 2 diabetes Followed by dr. Chalmers Cater. Has not been seen in office with covid. Needs a1c checked. She is currently on novolog 70/30 mix. 25 units BID. Fasting sugars are 120-160. Improved since starting insulin. She is on statin. Has not had eyes checked in a while. No ulcers/parasthesia/hypoglycemic events.  Immunization History  Administered Date(s) Administered   PFIZER SARS-COV-2 Vaccination 05/04/2019, 05/25/2019   Pneumococcal Polysaccharide-23 05/10/2016    Colonoscopy: 2020 due 06/19/2019. Has scheduled next week  Mammogram: 10/08/2018 Pap smear: n/a    Review of Systems  Constitutional: Negative for chills, fatigue and fever.  HENT: Negative for dental problem, ear pain, hearing loss and trouble swallowing.   Eyes: Negative for visual disturbance.  Respiratory: Negative for cough, chest tightness, shortness of breath and wheezing.   Cardiovascular: Negative for chest pain, palpitations and leg swelling.  Gastrointestinal: Negative for abdominal pain, blood in stool, diarrhea, nausea and vomiting.  Endocrine: Negative for cold intolerance, polydipsia, polyphagia and polyuria.  Genitourinary: Negative for dysuria, frequency, hematuria, pelvic pain and urgency.  Musculoskeletal: Negative for arthralgias.  Skin: Negative for rash.  Neurological: Negative for dizziness,  light-headedness and headaches.  Psychiatric/Behavioral: Negative for dysphoric mood and sleep disturbance. The patient is not nervous/anxious.     Allergies Patient is allergic to vancomycin and tape.  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  Surgical History Patient  has a past surgical history that includes Cesarean section (1995 and 1997); Abdominal hysterectomy (2000?); Cataract extraction (2011); Peritoneal catheter insertion; Ventral hernia repair (07/04/2011); Hernia repair (2005); Hernia repair (2013); Nephrectomy transplanted organ; Colonoscopy (N/A, 12/05/2017); and biopsy (12/05/2017).  Family History Pateint's family history includes Arthritis in her father, mother, and paternal grandmother; Cancer in her maternal grandfather, paternal grandfather, and paternal grandmother; Cancer (age of onset: 43) in her sister; Cerebral aneurysm in her father; Hearing loss in her paternal grandfather; Hypertension in her brother and father; Kidney disease in her brother; Polycystic kidney disease in her brother and father; Stroke in her father.  Social History Patient  reports that she has never smoked. She has never used smokeless tobacco. She reports that she does not drink alcohol or use drugs.    Objective: Vitals:   06/14/19 0805  BP: 132/88  Pulse: 84  Temp: 97.8 F (36.6 C)  TempSrc: Temporal  SpO2: 97%  Weight: 287 lb 3.2 oz (130.3 kg)  Height: 5' 5.5" (1.664 m)    Body mass index is 47.07 kg/m.  Physical Exam Vitals reviewed.  Constitutional:      Appearance: Normal appearance. She is well-developed. She is obese.  HENT:  Head: Normocephalic and atraumatic.     Right Ear: Tympanic  membrane, ear canal and external ear normal.     Left Ear: Tympanic membrane, ear canal and external ear normal.  Eyes:     Conjunctiva/sclera: Conjunctivae normal.     Pupils: Pupils are equal, round, and reactive to light.  Neck:     Thyroid: No thyromegaly.  Cardiovascular:     Rate and Rhythm: Normal rate and regular rhythm.     Pulses: Normal pulses.     Heart sounds: Normal heart sounds. No murmur.  Pulmonary:     Effort: Pulmonary effort is normal.     Breath sounds: Normal breath sounds.  Abdominal:     General: Bowel sounds are normal. There is no distension.     Palpations: Abdomen is soft.     Tenderness: There is no abdominal tenderness.  Musculoskeletal:     Cervical back: Normal range of motion and neck supple.  Lymphadenopathy:     Cervical: No cervical adenopathy.  Skin:    General: Skin is warm and dry.     Capillary Refill: Capillary refill takes less than 2 seconds.     Findings: No rash.  Neurological:     General: No focal deficit present.     Mental Status: She is alert and oriented to person, place, and time.     Cranial Nerves: No cranial nerve deficit.     Coordination: Coordination normal.     Deep Tendon Reflexes: Reflexes normal.  Psychiatric:        Mood and Affect: Mood normal.        Behavior: Behavior normal.      Office Visit from 06/14/2019 in [facility]  PHQ-2 Total Score  0          Assessment/plan: 1. Annual physical exam Routine lab work today. She is fasting. HM reviewed and essentially up to date. She has colonoscopy next week with [facility]. She will look into her tdap vaccine, but is pretty sure she has this. Needs eye exam. Encouraged weight loss/exercise. Overall, doing well.  Patient counseling [x]    Nutrition: Stressed importance of moderation in sodium/caffeine intake, saturated fat and cholesterol, caloric balance, sufficient intake of fresh fruits, vegetables, fiber, calcium, iron, and 1 mg of folate  supplement per day (for females capable of pregnancy).  [x]    Stressed the importance of regular exercise.   []    Substance Abuse: Discussed cessation/primary prevention of tobacco, alcohol, or other drug use; driving or other dangerous activities under the influence; availability of treatment for abuse.   [x]    Injury prevention: Discussed safety belts, safety helmets, smoke detector, smoking near bedding or upholstery.   [x]    Sexuality: Discussed sexually transmitted diseases, partner selection, use of condoms, avoidance of unintended pregnancy  and contraceptive alternatives.  [x]    Dental health: Discussed importance of regular tooth brushing, flossing, and dental visits.  [x]    Health maintenance and immunizations reviewed. Please refer to Health maintenance section.    - CBC with Differential/Platelet - Comprehensive metabolic panel - Lipid panel - TSH  2. Diabetes mellitus type 2, insulin dependent ([place]) Followed by dr. Chalmers Cater, but due for labs and will check these today for her.  - Hemoglobin A1c - Microalbumin / creatinine urine ratio  This visit occurred during the SARS-CoV-2 public health emergency.  Safety protocols were in place, including screening questions prior to the visit, additional usage of staff PPE, and extensive cleaning of exam room  while observing appropriate contact time as indicated for disinfecting solutions.    Return in about 1 year (around 06/13/2020) for annual or as needed. Orma Flaming, MD [place]  06/14/2019

## 2019-06-19 DIAGNOSIS — Z7982 Long term (current) use of aspirin: Secondary | ICD-10-CM | POA: Diagnosis not present

## 2019-06-19 DIAGNOSIS — C7A092 Malignant carcinoid tumor of the stomach: Secondary | ICD-10-CM | POA: Diagnosis not present

## 2019-06-19 DIAGNOSIS — N189 Chronic kidney disease, unspecified: Secondary | ICD-10-CM | POA: Diagnosis not present

## 2019-06-19 DIAGNOSIS — Z79899 Other long term (current) drug therapy: Secondary | ICD-10-CM | POA: Diagnosis not present

## 2019-06-19 DIAGNOSIS — Z794 Long term (current) use of insulin: Secondary | ICD-10-CM | POA: Diagnosis not present

## 2019-06-19 DIAGNOSIS — Z85038 Personal history of other malignant neoplasm of large intestine: Secondary | ICD-10-CM | POA: Diagnosis not present

## 2019-06-19 DIAGNOSIS — Z940 Kidney transplant status: Secondary | ICD-10-CM | POA: Diagnosis not present

## 2019-06-19 DIAGNOSIS — Z6841 Body Mass Index (BMI) 40.0 and over, adult: Secondary | ICD-10-CM | POA: Diagnosis not present

## 2019-06-19 DIAGNOSIS — C7A019 Malignant carcinoid tumor of the small intestine, unspecified portion: Secondary | ICD-10-CM | POA: Diagnosis not present

## 2019-06-19 DIAGNOSIS — D3A8 Other benign neuroendocrine tumors: Secondary | ICD-10-CM | POA: Diagnosis not present

## 2019-06-19 DIAGNOSIS — Z980 Intestinal bypass and anastomosis status: Secondary | ICD-10-CM | POA: Diagnosis not present

## 2019-06-19 DIAGNOSIS — E785 Hyperlipidemia, unspecified: Secondary | ICD-10-CM | POA: Diagnosis not present

## 2019-06-19 DIAGNOSIS — E669 Obesity, unspecified: Secondary | ICD-10-CM | POA: Diagnosis not present

## 2019-06-19 DIAGNOSIS — K5730 Diverticulosis of large intestine without perforation or abscess without bleeding: Secondary | ICD-10-CM | POA: Diagnosis not present

## 2019-06-19 DIAGNOSIS — I129 Hypertensive chronic kidney disease with stage 1 through stage 4 chronic kidney disease, or unspecified chronic kidney disease: Secondary | ICD-10-CM | POA: Diagnosis not present

## 2019-06-19 DIAGNOSIS — E1122 Type 2 diabetes mellitus with diabetic chronic kidney disease: Secondary | ICD-10-CM | POA: Diagnosis not present

## 2019-06-19 LAB — HM COLONOSCOPY

## 2019-06-26 ENCOUNTER — Encounter (HOSPITAL_BASED_OUTPATIENT_CLINIC_OR_DEPARTMENT_OTHER): Payer: Self-pay | Admitting: *Deleted

## 2019-06-26 ENCOUNTER — Emergency Department (HOSPITAL_BASED_OUTPATIENT_CLINIC_OR_DEPARTMENT_OTHER): Payer: BC Managed Care – PPO

## 2019-06-26 ENCOUNTER — Inpatient Hospital Stay (HOSPITAL_BASED_OUTPATIENT_CLINIC_OR_DEPARTMENT_OTHER)
Admit: 2019-06-26 | Discharge: 2019-06-28 | DRG: 378 | Disposition: A | Discharge status: Home / Self Care | Payer: BC Managed Care – PPO | Source: Ambulatory Visit | Attending: Internal Medicine | Admitting: Internal Medicine

## 2019-06-26 ENCOUNTER — Other Ambulatory Visit: Payer: Self-pay

## 2019-06-26 DIAGNOSIS — Z905 Acquired absence of kidney: Secondary | ICD-10-CM

## 2019-06-26 DIAGNOSIS — T8619 Other complication of kidney transplant: Secondary | ICD-10-CM | POA: Diagnosis present

## 2019-06-26 DIAGNOSIS — E119 Type 2 diabetes mellitus without complications: Secondary | ICD-10-CM | POA: Diagnosis not present

## 2019-06-26 DIAGNOSIS — K5730 Diverticulosis of large intestine without perforation or abscess without bleeding: Secondary | ICD-10-CM | POA: Diagnosis not present

## 2019-06-26 DIAGNOSIS — Z8271 Family history of polycystic kidney: Secondary | ICD-10-CM

## 2019-06-26 DIAGNOSIS — Z20822 Contact with and (suspected) exposure to covid-19: Secondary | ICD-10-CM | POA: Diagnosis not present

## 2019-06-26 DIAGNOSIS — K314 Gastric diverticulum: Secondary | ICD-10-CM | POA: Diagnosis not present

## 2019-06-26 DIAGNOSIS — Z940 Kidney transplant status: Secondary | ICD-10-CM

## 2019-06-26 DIAGNOSIS — Z87440 Personal history of urinary (tract) infections: Secondary | ICD-10-CM | POA: Diagnosis not present

## 2019-06-26 DIAGNOSIS — Z9889 Other specified postprocedural states: Secondary | ICD-10-CM | POA: Diagnosis not present

## 2019-06-26 DIAGNOSIS — K5731 Diverticulosis of large intestine without perforation or abscess with bleeding: Principal | ICD-10-CM | POA: Diagnosis present

## 2019-06-26 DIAGNOSIS — Z8249 Family history of ischemic heart disease and other diseases of the circulatory system: Secondary | ICD-10-CM | POA: Diagnosis not present

## 2019-06-26 DIAGNOSIS — Z7982 Long term (current) use of aspirin: Secondary | ICD-10-CM

## 2019-06-26 DIAGNOSIS — Z823 Family history of stroke: Secondary | ICD-10-CM | POA: Diagnosis not present

## 2019-06-26 DIAGNOSIS — I10 Essential (primary) hypertension: Secondary | ICD-10-CM | POA: Diagnosis present

## 2019-06-26 DIAGNOSIS — Y830 Surgical operation with transplant of whole organ as the cause of abnormal reaction of the patient, or of later complication, without mention of misadventure at the time of the procedure: Secondary | ICD-10-CM | POA: Diagnosis present

## 2019-06-26 DIAGNOSIS — N179 Acute kidney failure, unspecified: Secondary | ICD-10-CM | POA: Diagnosis not present

## 2019-06-26 DIAGNOSIS — Z794 Long term (current) use of insulin: Secondary | ICD-10-CM

## 2019-06-26 DIAGNOSIS — Z8261 Family history of arthritis: Secondary | ICD-10-CM

## 2019-06-26 DIAGNOSIS — Z9049 Acquired absence of other specified parts of digestive tract: Secondary | ICD-10-CM

## 2019-06-26 DIAGNOSIS — K625 Hemorrhage of anus and rectum: Secondary | ICD-10-CM | POA: Diagnosis not present

## 2019-06-26 DIAGNOSIS — E785 Hyperlipidemia, unspecified: Secondary | ICD-10-CM | POA: Diagnosis present

## 2019-06-26 DIAGNOSIS — Z03818 Encounter for observation for suspected exposure to other biological agents ruled out: Secondary | ICD-10-CM | POA: Diagnosis not present

## 2019-06-26 DIAGNOSIS — E66813 Obesity, class 3: Secondary | ICD-10-CM

## 2019-06-26 DIAGNOSIS — K760 Fatty (change of) liver, not elsewhere classified: Secondary | ICD-10-CM | POA: Diagnosis not present

## 2019-06-26 DIAGNOSIS — K922 Gastrointestinal hemorrhage, unspecified: Secondary | ICD-10-CM | POA: Diagnosis present

## 2019-06-26 DIAGNOSIS — Z6841 Body Mass Index (BMI) 40.0 and over, adult: Secondary | ICD-10-CM

## 2019-06-26 LAB — COMPREHENSIVE METABOLIC PANEL
ALT: 33 U/L (ref 0–44)
AST: 26 U/L (ref 15–41)
Albumin: 4 g/dL (ref 3.5–5.0)
Alkaline Phosphatase: 71 U/L (ref 38–126)
Anion gap: 10 (ref 5–15)
BUN: 19 mg/dL (ref 6–20)
CO2: 21 mmol/L — ABNORMAL LOW (ref 22–32)
Calcium: 8.8 mg/dL — ABNORMAL LOW (ref 8.9–10.3)
Chloride: 104 mmol/L (ref 98–111)
Creatinine, Ser: 1.26 mg/dL — ABNORMAL HIGH (ref 0.44–1.00)
GFR calc Af Amer: 56 mL/min — ABNORMAL LOW (ref >60)
GFR calc non Af Amer: 49 mL/min — ABNORMAL LOW (ref >60)
Glucose, Bld: 264 mg/dL — ABNORMAL HIGH (ref 70–99)
Potassium: 3.7 mmol/L (ref 3.5–5.1)
Sodium: 135 mmol/L (ref 135–145)
Total Bilirubin: 0.8 mg/dL (ref 0.3–1.2)
Total Protein: 6.6 g/dL (ref 6.5–8.1)

## 2019-06-26 LAB — PREGNANCY, URINE: Preg Test, Ur: NEGATIVE

## 2019-06-26 LAB — CBC
HCT: 39.7 % (ref 36.0–46.0)
Hemoglobin: 12.9 g/dL (ref 12.0–15.0)
MCH: 29.1 pg (ref 26.0–34.0)
MCHC: 32.5 g/dL (ref 30.0–36.0)
MCV: 89.6 fL (ref 80.0–100.0)
Platelets: 215 10*3/uL (ref 150–400)
RBC: 4.43 MIL/uL (ref 3.87–5.11)
RDW: 12.9 % (ref 11.5–15.5)
WBC: 7.2 10*3/uL (ref 4.0–10.5)
nRBC: 0 % (ref 0.0–0.2)

## 2019-06-26 LAB — URINALYSIS, ROUTINE W REFLEX MICROSCOPIC
Bilirubin Urine: NEGATIVE
Glucose, UA: >=500 mg/dL — AB
Hgb urine dipstick: NEGATIVE
Ketones, ur: NEGATIVE mg/dL
Nitrite: NEGATIVE
Protein, ur: NEGATIVE mg/dL
Specific Gravity, Urine: >1.03 — ABNORMAL HIGH (ref 1.005–1.030)
pH: 5 (ref 5.0–8.0)

## 2019-06-26 LAB — URINALYSIS, MICROSCOPIC (REFLEX)

## 2019-06-26 LAB — SARS CORONAVIRUS 2 BY RT PCR (HOSPITAL ORDER, PERFORMED IN ~~LOC~~ HOSPITAL LAB): SARS Coronavirus 2: NEGATIVE

## 2019-06-26 LAB — LIPASE, BLOOD: Lipase: 33 U/L (ref 11–51)

## 2019-06-26 LAB — OCCULT BLOOD X 1 CARD TO LAB, STOOL: Fecal Occult Bld: NEGATIVE

## 2019-06-26 MED ORDER — ONDANSETRON HCL 4 MG/2ML IJ SOLN
4.0000 mg | Freq: Once | INTRAMUSCULAR | Status: AC
  Administered 2019-06-26: 4 mg via INTRAVENOUS
  Filled 2019-06-26: qty 2

## 2019-06-26 MED ORDER — FENTANYL CITRATE (PF) 100 MCG/2ML IJ SOLN
50.0000 ug | Freq: Once | INTRAMUSCULAR | Status: AC
  Administered 2019-06-26: 50 ug via INTRAVENOUS
  Filled 2019-06-26: qty 2

## 2019-06-26 NOTE — ED Triage Notes (Signed)
Rectal bleeding onset Friday pm  Had colonoscopy on Wednesday  Bright red blood  Large amount

## 2019-06-26 NOTE — ED Notes (Signed)
Attempted to call report to floor, left call back number & name

## 2019-06-26 NOTE — ED Notes (Signed)
Provider at bedside

## 2019-06-26 NOTE — ED Notes (Signed)
[facility] @ [phone] for consult with Gertie Fey

## 2019-06-26 NOTE — ED Provider Notes (Signed)
[place] EMERGENCY DEPARTMENT Provider Note   CSN: [id number] Arrival date & time: 06/26/19  1551     History Chief Complaint  Patient presents with  . Rectal Bleeding    [name] [name] is a 54 y.o. female history of kidney transplant, neuroendocrine neoplasms, diabetes, obesity, hypertension  Patient presents today for abdominal pain and bloody stool.  Patient reports that she had a colonoscopy performed last Wednesday, 06/19/2019.  2 days later she noticed bright red blood in her stool and toilet water.  She reports large amounts of bright red blood over the last 5 days.  Over the last 2 days she has also developed generalized abdominal pain she describes as a moderate discomfort constant worsened with palpation of the abdomen no alleviating factors or radiation of pain.  Patient reports that she called her gastroenterologist at [facility] today and was informed to come to the emergency department for evaluation.  Denies fever/chills, chest pain/shortness of breath, cough/hemoptysis, dysuria/hematuria, fall/injury or any additional concerns. HPI     Past Medical History:  Diagnosis Date  . Arthritis   . Chronic kidney disease   . Diabetes mellitus without complication ([place])   . Eczema   . Enlarged kidney    bilateral  . Gout    previously  . History of chicken pox   . History of recurrent UTIs   . Hyperlipidemia    reports doesnt have elevated lipids, but takes due to hx of kidney transplant   . Hypertension    reports BP normalized after transplant  . 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   . Polycystic kidney disease   . Psoriasis     Patient Active Problem List   Diagnosis Date Noted  . Acute GI bleeding 06/26/2019  . PTDM (post-transplant diabetes mellitus) ([place]) 06/07/2018  . 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 Polycystic kidney disease 04/19/2017  . Diabetes mellitus type 2,  insulin dependent ([place]) 04/19/2017  . Kidney replaced by transplant 09/23/2011  . Immunosuppressive management encounter following kidney transplant 09/23/2011  . Psoriasis 09/23/2011    Past Surgical History:  Procedure Laterality Date  . ABDOMINAL HYSTERECTOMY  2000?   complete  . BIOPSY  12/05/2017   Procedure: BIOPSY;  Surgeon: Ronnette Juniper, MD;  Location: [facility];  Service: Gastroenterology;;  . CATARACT EXTRACTION  2011   bilateral  . [place]  . COLONOSCOPY N/A 12/05/2017   Procedure: COLONOSCOPY;  Surgeon: Ronnette Juniper, MD;  Location: [facility];  Service: Gastroenterology;  Laterality: N/A;  . HERNIA REPAIR  3614   umbilical hernia  . HERNIA REPAIR  2013  . NEPHRECTOMY TRANSPLANTED ORGAN    . PERITONEAL CATHETER INSERTION    . VENTRAL HERNIA REPAIR  07/04/2011   Procedure: HERNIA REPAIR VENTRAL ADULT;  Surgeon: Imogene Burn. Georgette Dover, MD;  Location: [facility] OR;  Service: General;  Laterality: N/A;     OB History    Gravida  2   Para  2   Term      Preterm      AB      Living  2     SAB      TAB      Ectopic      Multiple      Live Births              Family History  Problem Relation Age of Onset  . Arthritis Mother   .  Hypertension Father   . Polycystic kidney disease Father        died before starting dialysis was near ESRD  . Cerebral aneurysm Father        died age 58  . Stroke Father   . Arthritis Father   . Cancer Sister 71       cancer that developed in the fatty tissues of the muscle  . Kidney disease Brother   . Hypertension Brother   . Polycystic kidney disease Brother        transplant  7 years ago  . Cancer Maternal Grandfather   . Cancer Paternal Grandmother   . Arthritis Paternal Grandmother   . Cancer Paternal Grandfather   . Hearing loss Paternal Grandfather     Social History   Tobacco Use  . Smoking status: Never Smoker  . Smokeless tobacco: Never Used  Vaping Use  . Vaping Use: Never used    Substance Use Topics  . Alcohol use: No  . Drug use: No    Home Medications Prior to Admission medications   Medication Sig Start Date End Date Taking? Authorizing Provider  acetaminophen (TYLENOL) 325 MG tablet Take 650 mg by mouth daily as needed for headache (pain).    [provider]  aspirin 325 MG tablet Take 325 mg by mouth daily.    [provider]  aspirin 81 MG EC tablet Take by mouth.    [provider]  atorvastatin (LIPITOR) 40 MG tablet Take 40 mg by mouth at bedtime.  08/07/10   [provider]  CONTOUR NEXT TEST test strip USE ONE STRIP 3 TIMES DAILY 02/12/18   [provider]  docusate sodium (COLACE) 100 MG capsule TAKE 1 CAPSULE BY MOUTH TWICE A DAY FOR 10 DAYS 05/24/18   [provider]  enoxaparin (LOVENOX) 40 MG/0.4ML injection INJECT 0.4 MLS (40 MG TOTAL) SUBCUTANEOUSLY ONCE DAILY FOR 28 DAYS 05/24/18   [provider]  insulin aspart protamine- aspart (NOVOLOG MIX 70/30) (70-30) 100 UNIT/ML injection Inject 25 Units into the skin 2 (two) times daily with a meal.    [provider]  Insulin Pen Needle (B-D UF III MINI PEN NEEDLES) 31G X 5 MM MISC USE AS DIRECTED ONCE DAILY 03/27/17   [provider]  mycophenolate (MYFORTIC) 360 MG TBEC EC tablet Take 720 mg by mouth 2 (two) times daily.    [provider]  tacrolimus (PROGRAF) 1 MG capsule Take 1-2 mg by mouth See admin instructions. Take 2 tabs (2 mg) in the am and Take 1 tab (1 mg) at qhs.    [provider]    Allergies    Vancomycin and Tape  Review of Systems   Review of Systems Ten systems are reviewed and are negative for acute change except as noted in the HPI  Physical Exam Updated Vital Signs BP (!) 145/93 (BP Location: Left Arm)   Pulse 83   Temp 97.8 F (36.6 C) (Oral)   Resp 16   Ht 5' 5.5" (1.664 m)   Wt 131.4 kg   LMP  (LMP Unknown)   SpO2 99%   BMI 47.48 kg/m   Physical Exam Constitutional:       General: She is not in acute distress.    Appearance: Normal appearance. She is well-developed. She is obese. She is not ill-appearing or diaphoretic.  HENT:     Head: Normocephalic and atraumatic.  Eyes:     General: Vision grossly intact. Gaze  aligned appropriately.     Pupils: Pupils are equal, round, and reactive to light.  Neck:     Trachea: Trachea and phonation normal.  Pulmonary:     Effort: Pulmonary effort is normal. No respiratory distress.  Abdominal:     General: There is no distension.     Palpations: Abdomen is soft.     Tenderness: There is generalized abdominal tenderness. There is guarding. There is no rebound.  Musculoskeletal:        General: Normal range of motion.     Cervical back: Normal range of motion.  Skin:    General: Skin is warm and dry.  Neurological:     Mental Status: She is alert.     GCS: GCS eye subscore is 4. GCS verbal subscore is 5. GCS motor subscore is 6.     Comments: Speech is clear and goal oriented, follows commands Major Cranial nerves without deficit, no facial droop Moves extremities without ataxia, coordination intact  Psychiatric:        Behavior: Behavior normal.     ED Results / Procedures / Treatments   Labs (all labs ordered are listed, but only abnormal results are displayed) Labs Reviewed  COMPREHENSIVE METABOLIC PANEL - Abnormal; Notable for the following components:      Result Value   CO2 21 (*)    Glucose, Bld 264 (*)    Creatinine, Ser 1.26 (*)    Calcium 8.8 (*)    GFR calc non Af Amer 49 (*)    GFR calc Af Amer 56 (*)    All other components within normal limits  URINALYSIS, ROUTINE W REFLEX MICROSCOPIC - Abnormal; Notable for the following components:   Specific Gravity, Urine >1.030 (*)    Glucose, UA >=500 (*)    Leukocytes,Ua SMALL (*)    All other components within normal limits  URINALYSIS, MICROSCOPIC (REFLEX) - Abnormal; Notable for the following components:   Bacteria, UA FEW (*)    All  other components within normal limits  SARS CORONAVIRUS 2 BY RT PCR (HOSPITAL ORDER, [place] [facility])  LIPASE, BLOOD  CBC  PREGNANCY, URINE  OCCULT BLOOD X 1 CARD TO LAB, STOOL    EKG None  Radiology CT ABDOMEN PELVIS WO CONTRAST  Result Date: 06/26/2019 CLINICAL DATA:  Passage of bright red blood per rectum which began Friday with recent colonoscopy performed last Wednesday. EXAM: CT ABDOMEN AND PELVIS WITHOUT CONTRAST TECHNIQUE: Multidetector CT imaging of the abdomen and pelvis was performed following the standard protocol without IV contrast. COMPARISON:  CT 12/15/2017 FINDINGS: Lower chest: Mild atelectatic changes in the OS clear lung bases. Cardiac size at the upper limits of normal. Trace pericardial fluid is within normal limits. Hepatobiliary: Prominent Riedel's lobe. Diffuse hepatic hypoattenuation compatible with hepatic steatosis. 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 No concerning focal liver lesions. There is mild gallbladder wall thickening though this is possibly related to underdistention in a nonfasting state. No pericholecystic fluid or inflammation. No visible calcified gallstones or biliary dilatation. Pancreas: Unremarkable. No pancreatic ductal dilatation or surrounding inflammatory changes. Spleen: Normal in size without focal abnormality. Adrenals/Urinary Tract: No concerning adrenal lesions. Prior bilateral nephrectomy and right lower quadrant renal transplant placement. No visible contour deforming lesion, urolithiasis or frank hydronephrosis of the transplant kidney. Some mild pelviectasis of the transplant is slightly more pronounced than  on prior though possibly related to bladder distension. No gross bladder abnormality. Stomach/Bowel: Distal esophagus is unremarkable. Small air and  fluid-filled gastric diverticula along the posterior aspect of the gastric fundus measuring up to 2.5 cm in size. Previously demonstrates direct continuity on contrast-enhanced prior. Distal stomach and duodenum are unremarkable. No small bowel thickening or dilatation. No evidence of bowel obstruction. Prior right hemicolectomy with patent ileocolic anastomosis. A nodular density previously seen at the terminal ileum is less well visualized in the absence of enteric media though could correlate with recent colonoscopy. No colonic dilatation or wall thickening. Scattered colonic diverticula without focal pericolonic inflammation to suggest diverticulitis. Vascular/Lymphatic: No significant vascular findings are present. No enlarged abdominal or pelvic lymph nodes. Reproductive: Uterus is surgically absent. No concerning adnexal lesions. Other: Complex ventral hernia is noted with a large fascial defect of approximately 8.5 by 4.5 cm transverse by craniocaudal dimension (2/46, 6/75). A smaller upper abdominal ventral hernia with a 2.9 by 1.8 cm fascial defect is noted as well (2/23, 6/75). Hernia sacs contain fat without significant inflammation within the hernia contents to suggest active strangulation. No bowel containing hernias. No abdominopelvic free air or fluid. Musculoskeletal: No acute osseous abnormality or suspicious osseous lesion. Stable mild retrolisthesis L5 on S1 without pars defects. Additional mild degenerative changes in the lower lumbar spine, hips and pelvis. IMPRESSION: 1. Evidence of prior right hemicolectomy with patent ileocolic anastomosis. A nodular density previously seen at the terminal ileum is less well visualized in the absence of enteric media though could correlate with recent colonoscopy. 2. Colonic diverticulosis without evidence of diverticulitis. Colonic diverticula may bleed in the absence of a inflammation. 3. Complex ventral hernia containing fat without significant  inflammation within the hernia contents to suggest active strangulation. 4. Prior bilateral nephrectomy and right lower quadrant renal transplant placement. Some mild pelviectasis of the transplant kidney is slightly more pronounced than on prior though possibly related to bladder distension. Consider urinalysis to exclude infection or cystitis. 5. Hepatic steatosis. 6. Small air and fluid-filled gastric diverticula along the posterior aspect of the gastric fundus measuring up to 2.5 cm in size. 7. Aortic Atherosclerosis (ICD10-I70.0). Electronically Signed   By: Lovena Le M.D.   On: 06/26/2019 19:35    Procedures Procedures (including critical care time)  Medications Ordered in ED Medications  fentaNYL (SUBLIMAZE) injection 50 mcg (50 mcg Intravenous Given 06/26/19 1839)    ED Course  I have reviewed the triage vital signs and the nursing notes.  Pertinent labs & imaging results that were available during my care of the patient were reviewed by me and considered in my medical decision making (see chart for details).  Clinical Course as of Jun 25 2299  Wed Jun 26, 2019  2008 [facility]    [BM]  2024 [facility] RN   [BM]  2117 Dr. Roosevelt Locks   [BM]  2209 Dr. Alessandra Bevels   [BM]  2218 Dr. Hal Hope   [BM]    Clinical Course User Index [BM] Gari Crown   MDM Rules/Calculators/A&P                         Additional History Obtained: 1. Nursing notes from this visit. 2. Patient's EMR records.  I ordered, reviewed and interpreted labs which include: Negative Covid test Negative Hemoccult Lipase normal limits, doubt pancreatitis CBC within normal meds, no evidence of anemia or leukocytosis to suggest infection. CMP shows mild elevation of creatinine, no  emergent lecture derangements, elevation of LFTs or anion gap. Urinalysis shows no evidence of UTI. Pregnancy test negative.  CT abdomen pelvis:  IMPRESSION:  1. Evidence of prior right hemicolectomy with patent  ileocolic  anastomosis. A nodular density previously seen at the terminal ileum  is less well visualized in the absence of enteric media though could  correlate with recent colonoscopy.  2. Colonic diverticulosis without evidence of diverticulitis.  Colonic diverticula may bleed in the absence of a inflammation.  3. Complex ventral hernia containing fat without significant  inflammation within the hernia contents to suggest active  strangulation.  4. Prior bilateral nephrectomy and right lower quadrant renal  transplant placement. Some mild pelviectasis of the transplant  kidney is slightly more pronounced than on prior though possibly  related to bladder distension. Consider urinalysis to exclude  infection or cystitis.  5. Hepatic steatosis.  6. Small air and fluid-filled gastric diverticula along the  posterior aspect of the gastric fundus measuring up to 2.5 cm in  size.  7. Aortic Atherosclerosis (ICD10-I70.0).  - Discussed case with [facility] gastroenterologist Dr. Roosevelt Locks at 9:17 PM, advised that patient may be admitted here in [facility] no indication for transfer.  Patient will need evaluation by a gastroenterologist in [facility] for a flexible sigmoidoscopy. - Discussed case with gastroenterologist Dr. Alessandra Bevels at 10:09 PM.  He accepts patient, asked that patient be transferred to [facility]. - 10:18 PM: Discussed case with Dr. Hal Hope, patient has been accepted to hospitalist service. - Patient was reevaluated multiple times during this visit.  She remains hemodynamically stable well-appearing in no acute distress.  She is agreeable for transfer.  Case was discussed with Dr. Sherry Ruffing during this visit.  Note: Portions of this report may have been transcribed using voice recognition software. Every effort was made to ensure accuracy; however, inadvertent computerized transcription errors may still be present. Final Clinical Impression(s) / ED Diagnoses Final diagnoses:    Gastrointestinal hemorrhage, unspecified gastrointestinal hemorrhage type    Rx / DC Orders ED Discharge Orders    None       Gari Crown 06/26/19 2301    Tegeler, Gwenyth Allegra, MD 06/26/19 [id number]

## 2019-06-27 ENCOUNTER — Observation Stay (HOSPITAL_COMMUNITY): Payer: BC Managed Care – PPO

## 2019-06-27 DIAGNOSIS — Y830 Surgical operation with transplant of whole organ as the cause of abnormal reaction of the patient, or of later complication, without mention of misadventure at the time of the procedure: Secondary | ICD-10-CM | POA: Diagnosis present

## 2019-06-27 DIAGNOSIS — K922 Gastrointestinal hemorrhage, unspecified: Secondary | ICD-10-CM | POA: Diagnosis present

## 2019-06-27 DIAGNOSIS — Z7982 Long term (current) use of aspirin: Secondary | ICD-10-CM | POA: Diagnosis not present

## 2019-06-27 DIAGNOSIS — K5731 Diverticulosis of large intestine without perforation or abscess with bleeding: Secondary | ICD-10-CM | POA: Diagnosis present

## 2019-06-27 DIAGNOSIS — E119 Type 2 diabetes mellitus without complications: Secondary | ICD-10-CM | POA: Diagnosis present

## 2019-06-27 DIAGNOSIS — N179 Acute kidney failure, unspecified: Secondary | ICD-10-CM | POA: Diagnosis present

## 2019-06-27 DIAGNOSIS — Z6841 Body Mass Index (BMI) 40.0 and over, adult: Secondary | ICD-10-CM | POA: Diagnosis not present

## 2019-06-27 DIAGNOSIS — Z905 Acquired absence of kidney: Secondary | ICD-10-CM | POA: Diagnosis not present

## 2019-06-27 DIAGNOSIS — K314 Gastric diverticulum: Secondary | ICD-10-CM | POA: Diagnosis present

## 2019-06-27 DIAGNOSIS — Z823 Family history of stroke: Secondary | ICD-10-CM | POA: Diagnosis not present

## 2019-06-27 DIAGNOSIS — Z9049 Acquired absence of other specified parts of digestive tract: Secondary | ICD-10-CM

## 2019-06-27 DIAGNOSIS — Z8249 Family history of ischemic heart disease and other diseases of the circulatory system: Secondary | ICD-10-CM | POA: Diagnosis not present

## 2019-06-27 DIAGNOSIS — Z8271 Family history of polycystic kidney: Secondary | ICD-10-CM | POA: Diagnosis not present

## 2019-06-27 DIAGNOSIS — Z20822 Contact with and (suspected) exposure to covid-19: Secondary | ICD-10-CM | POA: Diagnosis present

## 2019-06-27 DIAGNOSIS — Z794 Long term (current) use of insulin: Secondary | ICD-10-CM | POA: Diagnosis not present

## 2019-06-27 DIAGNOSIS — I10 Essential (primary) hypertension: Secondary | ICD-10-CM | POA: Diagnosis present

## 2019-06-27 DIAGNOSIS — Z87440 Personal history of urinary (tract) infections: Secondary | ICD-10-CM | POA: Diagnosis not present

## 2019-06-27 DIAGNOSIS — E785 Hyperlipidemia, unspecified: Secondary | ICD-10-CM | POA: Diagnosis present

## 2019-06-27 DIAGNOSIS — T8619 Other complication of kidney transplant: Secondary | ICD-10-CM | POA: Diagnosis present

## 2019-06-27 DIAGNOSIS — Z8261 Family history of arthritis: Secondary | ICD-10-CM | POA: Diagnosis not present

## 2019-06-27 LAB — GLUCOSE, CAPILLARY
Glucose-Capillary: 144 mg/dL — ABNORMAL HIGH (ref 70–99)
Glucose-Capillary: 147 mg/dL — ABNORMAL HIGH (ref 70–99)
Glucose-Capillary: 160 mg/dL — ABNORMAL HIGH (ref 70–99)
Glucose-Capillary: 131 mg/dL — ABNORMAL HIGH (ref 70–99)
Glucose-Capillary: 130 mg/dL — ABNORMAL HIGH (ref 70–99)
Glucose-Capillary: 147 mg/dL — ABNORMAL HIGH (ref 70–99)

## 2019-06-27 LAB — TYPE AND SCREEN
ABO/RH(D): A POS
Antibody Screen: NEGATIVE

## 2019-06-27 LAB — HEMOGLOBIN AND HEMATOCRIT, BLOOD
HCT: 37.6 % (ref 36.0–46.0)
HCT: 36.8 % (ref 36.0–46.0)
HCT: 38.2 % (ref 36.0–46.0)
HCT: 36.5 % (ref 36.0–46.0)
Hemoglobin: 12.2 g/dL (ref 12.0–15.0)
Hemoglobin: 12 g/dL (ref 12.0–15.0)
Hemoglobin: 12.6 g/dL (ref 12.0–15.0)
Hemoglobin: 11.9 g/dL — ABNORMAL LOW (ref 12.0–15.0)

## 2019-06-27 LAB — ABO/RH: ABO/RH(D): A POS

## 2019-06-27 LAB — HIV ANTIBODY (ROUTINE TESTING W REFLEX): HIV Screen 4th Generation wRfx: NONREACTIVE

## 2019-06-27 MED ORDER — SODIUM CHLORIDE 0.9 % IV SOLN: INTRAVENOUS | Status: DC

## 2019-06-27 MED ORDER — TACROLIMUS 1 MG PO CAPS
1.0000 mg | ORAL_CAPSULE | Freq: Two times a day (BID) | ORAL | Status: DC
  Administered 2019-06-27 (×2): 1 mg via ORAL
  Filled 2019-06-27 (×3): qty 1

## 2019-06-27 MED ORDER — ATORVASTATIN CALCIUM 40 MG PO TABS
40.0000 mg | ORAL_TABLET | Freq: Every day | ORAL | Status: DC
  Administered 2019-06-27: 40 mg via ORAL
  Filled 2019-06-27: qty 1

## 2019-06-27 MED ORDER — INSULIN ASPART 100 UNIT/ML ~~LOC~~ SOLN
0.0000 [IU] | SUBCUTANEOUS | Status: DC
  Administered 2019-06-27: 2 [IU] via SUBCUTANEOUS
  Administered 2019-06-27: 3 [IU] via SUBCUTANEOUS
  Administered 2019-06-27 – 2019-06-28 (×5): 2 [IU] via SUBCUTANEOUS
  Administered 2019-06-28: 3 [IU] via SUBCUTANEOUS

## 2019-06-27 MED ORDER — MYCOPHENOLATE SODIUM 180 MG PO TBEC
360.0000 mg | DELAYED_RELEASE_TABLET | Freq: Two times a day (BID) | ORAL | Status: DC
  Administered 2019-06-27: 360 mg via ORAL
  Filled 2019-06-27: qty 2
  Filled 2019-06-27: qty 1
  Filled 2019-06-27 (×2): qty 2

## 2019-06-27 MED ORDER — ACETAMINOPHEN 325 MG PO TABS
650.0000 mg | ORAL_TABLET | Freq: Four times a day (QID) | ORAL | Status: DC | PRN
  Administered 2019-06-27: 650 mg via ORAL
  Filled 2019-06-27: qty 2

## 2019-06-27 MED ORDER — ONDANSETRON HCL 4 MG/2ML IJ SOLN: 4.0000 mg | Freq: Four times a day (QID) | INTRAMUSCULAR | Status: DC | PRN

## 2019-06-27 MED ORDER — MORPHINE SULFATE (PF) 2 MG/ML IV SOLN: 2.0000 mg | INTRAVENOUS | Status: DC | PRN

## 2019-06-27 MED ORDER — TECHNETIUM TC 99M-LABELED RED BLOOD CELLS IV KIT
23.8000 | PACK | Freq: Once | INTRAVENOUS | Status: AC | PRN
  Administered 2019-06-27: 23.8 via INTRAVENOUS

## 2019-06-27 MED ORDER — ONDANSETRON HCL 4 MG PO TABS: 4.0000 mg | ORAL_TABLET | Freq: Four times a day (QID) | ORAL | Status: DC | PRN

## 2019-06-27 MED ORDER — HYDROCODONE-ACETAMINOPHEN 5-325 MG PO TABS: 1.0000 | ORAL_TABLET | ORAL | Status: DC | PRN

## 2019-06-27 MED ORDER — ACETAMINOPHEN 650 MG RE SUPP: 650.0000 mg | Freq: Four times a day (QID) | RECTAL | Status: DC | PRN

## 2019-06-27 MED ORDER — MYCOPHENOLATE SODIUM 360 MG PO TBEC
360.0000 mg | DELAYED_RELEASE_TABLET | Freq: Two times a day (BID) | ORAL | Status: DC
  Administered 2019-06-27: 360 mg via ORAL
  Filled 2019-06-27: qty 1

## 2019-06-27 NOTE — H&P (Signed)
History and Physical    [name] [name] HAL:[id number] DOB: 02-01-1965 DOA: 06/26/2019  PCP: Orma Flaming, MD   Patient coming from: home  I have personally briefly reviewed patient's old medical records in [place]  Chief Complaint: Abdominal pain and bloody stool  HPI: [name] [name] is a 54 y.o. 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  Patient has been having bright red blood per rectum intermittently,associated with lower abdominal cramping of moderate intensity. No aggravating or alleviating factors to the pain. She had a single episode of vomiting 1 day ago.  Last bloody BM was about 16 hours prior to arrival.  Has had no fever or chills. Denies chest pain shortness of breath or palpitations or lightheadedness.In [place] hemoglobin was 12.9, her baseline, , creatinine 1.26 slightly above baseline of 1.17, blood sugar 264. Patient spoke to her gastroenterologist at [facility] who advised her to come into the ER for evaluation.  The emergency room provider at [facility] spoke with patient's gastroenterologist at [facility] who advised that patient could be admitted locally. He subsequently spoke with GI Dr. Alessandra Bevels who accepted patient. Patient is admitted to the hospitalist service.   Review of Systems: As per HPI otherwise 10 point review of systems negative.    Past Medical History:  Diagnosis Date  . Arthritis   . Chronic kidney disease   . Diabetes mellitus without complication ([place])   . Eczema   . Enlarged kidney    bilateral  . Gout    previously  . History of chicken pox   . History of recurrent UTIs   . Hyperlipidemia    reports doesnt have elevated lipids, but takes due  to hx of kidney transplant   . Hypertension    reports BP normalized after transplant  . 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   . Polycystic kidney disease   . Psoriasis     Past Surgical History:  Procedure Laterality Date  . ABDOMINAL HYSTERECTOMY  2000?   complete  . BIOPSY  12/05/2017   Procedure: BIOPSY;  Surgeon: Ronnette Juniper, MD;  Location: [facility];  Service: Gastroenterology;;  . CATARACT EXTRACTION  2011   bilateral  . Painter  . COLONOSCOPY N/A 12/05/2017   Procedure: COLONOSCOPY;  Surgeon: Ronnette Juniper, MD;  Location: [facility];  Service: Gastroenterology;  Laterality: N/A;  . HERNIA REPAIR  [id number]   umbilical hernia  . HERNIA REPAIR  2013  . NEPHRECTOMY TRANSPLANTED ORGAN    . PERITONEAL CATHETER INSERTION    . VENTRAL HERNIA REPAIR  07/04/2011   Procedure: HERNIA REPAIR VENTRAL ADULT;  Surgeon: Imogene Burn. Georgette Dover, MD;  Location: [facility];  Service: General;  Laterality: N/A;     reports that she has never smoked. She has never used smokeless tobacco. She reports that she does not drink alcohol and does not use drugs.  Allergies  Allergen Reactions  . Vancomycin   . Tape Rash    Tears skin     Family History  Problem Relation Age of Onset  . Arthritis Mother   . Hypertension Father   . Polycystic kidney disease Father  died before starting dialysis was near ESRD  . Cerebral aneurysm Father        died age 68  . Stroke Father   . Arthritis Father   . Cancer Sister 87       cancer that developed in the fatty tissues of the muscle  . Kidney disease Brother   . Hypertension Brother   . Polycystic kidney disease Brother        transplant  7 years ago  . Cancer Maternal Grandfather   . Cancer Paternal Grandmother   . Arthritis Paternal Grandmother   . Cancer Paternal Grandfather   . Hearing loss Paternal Grandfather      Prior to Admission medications   Medication Sig Start  Date End Date Taking? Authorizing Provider  acetaminophen (TYLENOL) 325 MG tablet Take 650 mg by mouth daily as needed for headache (pain).    [provider]  aspirin 325 MG tablet Take 325 mg by mouth daily.    [provider]  aspirin 81 MG EC tablet Take by mouth.    [provider]  atorvastatin (LIPITOR) 40 MG tablet Take 40 mg by mouth at bedtime.  08/07/10   [provider]  CONTOUR NEXT TEST test strip USE ONE STRIP 3 TIMES DAILY 02/12/18   [provider]  docusate sodium (COLACE) 100 MG capsule TAKE 1 CAPSULE BY MOUTH TWICE A DAY FOR 10 DAYS 05/24/18   [provider]  enoxaparin (LOVENOX) 40 MG/0.4ML injection INJECT 0.4 MLS (40 MG TOTAL) SUBCUTANEOUSLY ONCE DAILY FOR 28 DAYS 05/24/18   [provider]  insulin aspart protamine- aspart (NOVOLOG MIX 70/30) (70-30) 100 UNIT/ML injection Inject 25 Units into the skin 2 (two) times daily with a meal.    [provider]  Insulin Pen Needle (B-D UF III MINI PEN NEEDLES) 31G X 5 MM MISC USE AS DIRECTED ONCE DAILY 03/27/17   [provider]  mycophenolate (MYFORTIC) 360 MG TBEC EC tablet Take 720 mg by mouth 2 (two) times daily.    [provider]  tacrolimus (PROGRAF) 1 MG capsule Take 1-2 mg by mouth See admin instructions. Take 2 tabs (2 mg) in the am and Take 1 tab (1 mg) at qhs.    [provider]    Physical Exam: Vitals:   06/26/19 1819 06/26/19 2021 06/26/19 2138 06/27/19 0035  BP: 134/85 127/81 (!) 145/93 (!) 151/81  Pulse: 84 79 83 71  Resp: 18 18 16 17   Temp:    98 F (36.7 C)  TempSrc:      SpO2: 98% 99% 99% 98%  Weight:    130.8 kg  Height:         Vitals:   06/26/19 1819 06/26/19 2021 06/26/19 2138 06/27/19 0035  BP: 134/85 127/81 (!) 145/93 (!) 151/81  Pulse: 84 79 83 71  Resp: 18 18 16 17   Temp:    98 F (36.7 C)  TempSrc:      SpO2: 98% 99% 99% 98%  Weight:    130.8 kg  Height:          Constitutional: Alert  and oriented x 3 . Not in any apparent distress HEENT:      Head: Normocephalic and atraumatic.         Eyes: PERLA, EOMI, Conjunctivae are normal. Sclera is non-icteric.       Mouth/Throat: Mucous membranes are moist.       Neck: Supple with no signs of meningismus. Cardiovascular: Regular  rate and rhythm. No murmurs, gallops, or rubs. 2+ symmetrical distal pulses are present . No JVD. No LE edema Respiratory: Respiratory effort normal .Lungs sounds clear bilaterally. No wheezes, crackles, or rhonchi.  Gastrointestinal: Soft, mild diffuse abdominal pain on palpation, and non distended with positive bowel sounds. No rebound or guarding. Genitourinary: No CVA tenderness. Musculoskeletal: Nontender with normal range of motion in all extremities. No edema, cyanosis, or erythema of extremities. Neurologic: Normal speech and language. Face is symmetric. Moving all extremities. No gross focal neurologic deficits . Skin: Skin is warm, dry.  No rash or ulcers Psychiatric: Mood and affect are normal Speech and behavior are normal   Labs on Admission: I have personally reviewed following labs and imaging studies  CBC: Recent Labs  Lab 06/26/19 1745  WBC 7.2  HGB 12.9  HCT 39.7  MCV 89.6  PLT 623   Basic Metabolic Panel: Recent Labs  Lab 06/26/19 1745  NA 135  K 3.7  CL 104  CO2 21*  GLUCOSE 264*  BUN 19  CREATININE 1.26*  CALCIUM 8.8*   GFR: Estimated Creatinine Clearance: 71.1 mL/min (A) (by C-G formula based on SCr of 1.26 mg/dL (H)). Liver Function Tests: Recent Labs  Lab 06/26/19 1745  AST 26  ALT 33  ALKPHOS 71  BILITOT 0.8  PROT 6.6  ALBUMIN 4.0   Recent Labs  Lab 06/26/19 1745  LIPASE 33   No results for input(s): AMMONIA in the last 168 hours. Coagulation Profile: No results for input(s): INR, PROTIME in the last 168 hours. Cardiac Enzymes: No results for input(s): CKTOTAL, CKMB, CKMBINDEX, TROPONINI in the last 168 hours. BNP (last 3 results) No results  for input(s): PROBNP in the last 8760 hours. HbA1C: No results for input(s): HGBA1C in the last 72 hours. CBG: No results for input(s): GLUCAP in the last 168 hours. Lipid Profile: No results for input(s): CHOL, HDL, LDLCALC, TRIG, CHOLHDL, LDLDIRECT in the last 72 hours. Thyroid Function Tests: No results for input(s): TSH, T4TOTAL, FREET4, T3FREE, THYROIDAB in the last 72 hours. Anemia Panel: No results for input(s): VITAMINB12, FOLATE, FERRITIN, TIBC, IRON, RETICCTPCT in the last 72 hours. Urine analysis:    Component Value Date/Time   COLORURINE YELLOW 06/26/2019 1636   APPEARANCEUR CLEAR 06/26/2019 1636   LABSPEC >1.030 (H) 06/26/2019 1636   PHURINE 5.0 06/26/2019 1636   GLUCOSEU >=500 (A) 06/26/2019 1636   HGBUR NEGATIVE 06/26/2019 1636   BILIRUBINUR NEGATIVE 06/26/2019 1636   BILIRUBINUR negative 06/13/2018 0957   BILIRUBINUR N 06/07/2018 1053   KETONESUR NEGATIVE 06/26/2019 1636   PROTEINUR NEGATIVE 06/26/2019 1636   UROBILINOGEN 0.2 06/13/2018 0957   UROBILINOGEN 0.2 07/04/2011 2050   NITRITE NEGATIVE 06/26/2019 1636   LEUKOCYTESUR SMALL (A) 06/26/2019 1636    Radiological Exams on Admission: CT ABDOMEN PELVIS WO CONTRAST  Result Date: 06/26/2019 CLINICAL DATA:  Passage of bright red blood per rectum which began Friday with recent colonoscopy performed last Wednesday. EXAM: CT ABDOMEN AND PELVIS WITHOUT CONTRAST TECHNIQUE: Multidetector CT imaging of the abdomen and pelvis was performed following the standard protocol without IV contrast. COMPARISON:  CT 12/15/2017 FINDINGS: Lower chest: Mild atelectatic changes in the OS clear lung bases. Cardiac size at the upper limits of normal. Trace pericardial fluid is within normal limits. Hepatobiliary: Prominent Riedel's lobe. Diffuse hepatic hypoattenuation compatible with hepatic steatosis. Multiple hypoattenuating fluid attenuation cysts are present throughout the liver, finding compatible with known history of polycystic  kidney disease which does result in cystic disease of the  liver. No concerning focal liver lesions. There is mild gallbladder wall thickening though this is possibly related to underdistention in a nonfasting state. No pericholecystic fluid or inflammation. No visible calcified gallstones or biliary dilatation. Pancreas: Unremarkable. No pancreatic ductal dilatation or surrounding inflammatory changes. Spleen: Normal in size without focal abnormality. Adrenals/Urinary Tract: No concerning adrenal lesions. Prior bilateral nephrectomy and right lower quadrant renal transplant placement. No visible contour deforming lesion, urolithiasis or frank hydronephrosis of the transplant kidney. Some mild pelviectasis of the transplant is slightly more pronounced than on prior though possibly related to bladder distension. No gross bladder abnormality. Stomach/Bowel: Distal esophagus is unremarkable. Small air and fluid-filled gastric diverticula along the posterior aspect of the gastric fundus measuring up to 2.5 cm in size. Previously demonstrates direct continuity on contrast-enhanced prior. Distal stomach and duodenum are unremarkable. No small bowel thickening or dilatation. No evidence of bowel obstruction. Prior right hemicolectomy with patent ileocolic anastomosis. A nodular density previously seen at the terminal ileum is less well visualized in the absence of enteric media though could correlate with recent colonoscopy. No colonic dilatation or wall thickening. Scattered colonic diverticula without focal pericolonic inflammation to suggest diverticulitis. Vascular/Lymphatic: No significant vascular findings are present. No enlarged abdominal or pelvic lymph nodes. Reproductive: Uterus is surgically absent. No concerning adnexal lesions. Other: Complex ventral hernia is noted with a large fascial defect of approximately 8.5 by 4.5 cm transverse by craniocaudal dimension (2/46, 6/75). A smaller upper abdominal ventral  hernia with a 2.9 by 1.8 cm fascial defect is noted as well (2/23, 6/75). Hernia sacs contain fat without significant inflammation within the hernia contents to suggest active strangulation. No bowel containing hernias. No abdominopelvic free air or fluid. Musculoskeletal: No acute osseous abnormality or suspicious osseous lesion. Stable mild retrolisthesis L5 on S1 without pars defects. Additional mild degenerative changes in the lower lumbar spine, hips and pelvis. IMPRESSION: 1. Evidence of prior right hemicolectomy with patent ileocolic anastomosis. A nodular density previously seen at the terminal ileum is less well visualized in the absence of enteric media though could correlate with recent colonoscopy. 2. Colonic diverticulosis without evidence of diverticulitis. Colonic diverticula may bleed in the absence of a inflammation. 3. Complex ventral hernia containing fat without significant inflammation within the hernia contents to suggest active strangulation. 4. Prior bilateral nephrectomy and right lower quadrant renal transplant placement. Some mild pelviectasis of the transplant kidney is slightly more pronounced than on prior though possibly related to bladder distension. Consider urinalysis to exclude infection or cystitis. 5. Hepatic steatosis. 6. Small air and fluid-filled gastric diverticula along the posterior aspect of the gastric fundus measuring up to 2.5 cm in size. 7. Aortic Atherosclerosis (ICD10-I70.0). Electronically Signed   By: Lovena Le M.D.   On: 06/26/2019 19:35    EKG: Independently reviewed.   Assessment/Plan Principal Problem:   Acute GI bleeding in the setting of recent colonoscopy -Patient with history of right hemicolectomy for neuroendocrine tumor 2019, followed at [facility], status post recent colonoscopy on 06/19/2019, now presenting with bright red blood per rectum -CT abdomen and pelvis showing patent ileocolic anastomosis, diverticulosis without evidence of  diverticulitis, among other findings -Dr. Cheyenne Adas has agreed to see patient per prior conversation with transferring physician -Patient hemodynamically stable. Hemoglobin within normal limits per review of records from [facility] -Serial H&H, type and screen, transfuse if necessary -N.p.o. for possible GI procedure in the a.m.  History of right hemicolectomy secondary to neuroendocrine tumor -Followed at [facility] and had recent surveillance  colonoscopy on 06/19/2019 -No acute concerns.  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  Morbid obesity, BMI 47 -This complicates overall prognosis and care     DVT prophylaxis: SCDs Code Status: full code  Family Communication:  none  Disposition Plan: Back to previous home environment Consults called: Gi, Dr Layla Maw aware. following  Status:obs    Athena Masse MD [facility]     06/27/2019, 12:56 AM

## 2019-06-27 NOTE — Consult Note (Addendum)
Referring Provider: Nuala Alpha, PA-C/ Dr. Nuala Alpha (ED) Primary Care Physician:  Orma Flaming, MD Primary Gastroenterologist:  Althia Forts ([facility])  Reason for Consultation:  Rectal bleeding  HPI: [name] [name] is a 54 y.o. female with history of neuroendocrine tumor s/p right ileocolectomy, bilateral nephrectomy with right kidney transplant (2013 due to ESRD), and ventral hernia repair presenting with rectal bleeding after colonoscopy 06/19/19.  Patient reports she had a colonoscopy on 06/19/2019 at [facility] and started noticing rectal bleeding 2 days later on 06/21/2019.  Since that time, she has had rectal bleeding daily.  She typically has 2 formed bowel movements per day with passage of bright red blood surrounding the stool.  Denies any melena or diarrhea.  Denies any rectal pain.  Patient reports she typically has numbness in her abdomen from prior abdominal surgeries; however, recently, she has been feeling some abdominal discomfort since the colonoscopy.  She also had one episode of emesis a few days ago but denies any coffee-ground emesis or hematemesis.  She felt a little bit nauseated last night but is not feeling nauseated today.  Since her colonoscopy on 6/9, she has not had much of an appetite.  She denies any unintentional weight loss.  Patient states she takes 81 mg aspirin daily, last dose yesterday.  However, she does not take any blood thinners or NSAIDs.  States she had minimal amounts of rectal bleeding after her ileocolectomy but has otherwise never had any rectal bleeding.  Denies past history of excessive bleeding or family history of bleeding disorders.  Patient had a surveillance colonoscopy 06/19/19 which showed evidence of a prior end-to-side ileo-colonic anastomosis in the ascending colon (it was patent) as well as sigmoid diverticulosis.  There were no additional findings, and no specimens were collected. The anastomosis was traversed.   Past Medical History:   Diagnosis Date   Arthritis    Chronic kidney disease    Diabetes mellitus without complication (HCC)    Eczema    Enlarged kidney    bilateral   Gout    previously   History of chicken pox    History of recurrent UTIs    Hyperlipidemia    reports doesnt have elevated lipids, but takes due to hx of kidney transplant    Hypertension    reports BP normalized after transplant   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    Polycystic kidney disease    Psoriasis     Past Surgical History:  Procedure Laterality Date   ABDOMINAL HYSTERECTOMY  2000?   complete   BIOPSY  12/05/2017   Procedure: BIOPSY;  Surgeon: Ronnette Juniper, MD;  Location: [facility];  Service: Gastroenterology;;   CATARACT EXTRACTION  2011   bilateral   [place] N/A 12/05/2017   Procedure: COLONOSCOPY;  Surgeon: Ronnette Juniper, MD;  Location: [facility];  Service: Gastroenterology;  Laterality: N/A;   HERNIA REPAIR  7628   umbilical hernia   HERNIA REPAIR  2013   NEPHRECTOMY TRANSPLANTED ORGAN     PERITONEAL CATHETER INSERTION     VENTRAL HERNIA REPAIR  07/04/2011   Procedure: HERNIA REPAIR VENTRAL ADULT;  Surgeon: Imogene Burn. Georgette Dover, MD;  Location: [facility];  Service: General;  Laterality: N/A;    Prior to Admission medications   Medication Sig Start Date End Date Taking? Authorizing Provider  acetaminophen (TYLENOL) 325 MG tablet Take 650 mg by mouth daily as needed for headache (pain).  [provider]  aspirin 325 MG tablet Take 325 mg by mouth daily.    [provider]  aspirin 81 MG EC tablet Take by mouth.    [provider]  atorvastatin (LIPITOR) 40 MG tablet Take 40 mg by mouth at bedtime.  08/07/10   [provider]  CONTOUR NEXT TEST test strip USE ONE STRIP 3 TIMES DAILY 02/12/18   [provider]  docusate sodium (COLACE) 100 MG capsule TAKE 1 CAPSULE BY MOUTH TWICE A DAY FOR 10  DAYS 05/24/18   [provider]  enoxaparin (LOVENOX) 40 MG/0.4ML injection INJECT 0.4 MLS (40 MG TOTAL) SUBCUTANEOUSLY ONCE DAILY FOR 28 DAYS 05/24/18   [provider]  insulin aspart protamine- aspart (NOVOLOG MIX 70/30) (70-30) 100 UNIT/ML injection Inject 25 Units into the skin 2 (two) times daily with a meal.    [provider]  Insulin Pen Needle (B-D UF III MINI PEN NEEDLES) 31G X 5 MM MISC USE AS DIRECTED ONCE DAILY 03/27/17   [provider]  mycophenolate (MYFORTIC) 360 MG TBEC EC tablet Take 720 mg by mouth 2 (two) times daily.    [provider]  tacrolimus (PROGRAF) 1 MG capsule Take 1-2 mg by mouth See admin instructions. Take 2 tabs (2 mg) in the am and Take 1 tab (1 mg) at qhs.    [provider]    Scheduled Meds:  atorvastatin  40 mg Oral QHS   insulin aspart  0-15 Units Subcutaneous Q4H   mycophenolate  360 mg Oral BID   tacrolimus  1 mg Oral BID   Continuous Infusions:  sodium chloride 100 mL/hr at 06/27/19 0112   PRN Meds:.acetaminophen **OR** acetaminophen, HYDROcodone-acetaminophen, morphine injection, ondansetron **OR** ondansetron (ZOFRAN) IV  Allergies as of 06/26/2019 - Review Complete 06/26/2019  Allergen Reaction Noted   Vancomycin  02/10/2015   Tape Rash 04/19/2017    Family History  Problem Relation Age of Onset   Arthritis Mother    Hypertension Father    Polycystic kidney disease Father        died before starting dialysis was near ESRD   Cerebral aneurysm Father        died age 59   Stroke Father    Arthritis Father    Cancer Sister 35       cancer that developed in the fatty tissues of the muscle   Kidney disease Brother    Hypertension Brother    Polycystic kidney disease Brother        transplant  7 years ago   Cancer Maternal Grandfather    Cancer Paternal Grandmother    Arthritis Paternal Grandmother    Cancer Paternal Grandfather    Hearing loss Paternal Grandfather     Social  History   Socioeconomic History   Marital status: Married    Spouse name: Charles   Number of children: 2   Years of education: Not on file   Highest education level: Not on file  Occupational History    Employer: [employer]    Comment: Medical illustrator  Tobacco Use   Smoking status: Never Smoker   Smokeless tobacco: Never Used  Scientific laboratory technician Use: Never used  Substance and Sexual Activity   Alcohol use: No   Drug use: No   Sexual activity: Yes    Partners: Male  Other Topics Concern   Not on file  Social History Narrative   Not on file   Social Determinants  of Health   Financial Resource Strain:    Difficulty of Paying Living Expenses:   Food Insecurity:    Worried About Charity fundraiser in the Last Year:    Arboriculturist in the Last Year:   Transportation Needs:    Film/video editor (Medical):    Lack of Transportation (Non-Medical):   Physical Activity:    Days of Exercise per Week:    Minutes of Exercise per Session:   Stress:    Feeling of Stress :   Social Connections:    Frequency of Communication with Friends and Family:    Frequency of Social Gatherings with Friends and Family:    Attends Religious Services:    Active Member of Clubs or Organizations:    Attends Music therapist:    Marital Status:   Intimate Partner Violence:    Fear of Current or Ex-Partner:    Emotionally Abused:    Physically Abused:    Sexually Abused:     Review of Systems: Review of Systems  Constitutional: Negative for chills, fever and weight loss.  HENT: Negative for hearing loss and tinnitus.   Eyes: Negative for pain and redness.  Respiratory: Negative for cough and shortness of breath.   Cardiovascular: Negative for chest pain and palpitations.  Gastrointestinal: Positive for abdominal pain (mild, diffuse) and blood in stool. Negative for constipation, diarrhea, heartburn, melena, nausea and vomiting.  Genitourinary: Negative for  flank pain and hematuria.  Musculoskeletal: Negative for falls and joint pain.  Skin: Negative for itching and rash.  Neurological: Negative for seizures and loss of consciousness.  Endo/Heme/Allergies: Negative for polydipsia. Does not bruise/bleed easily.  Psychiatric/Behavioral: Negative for substance abuse. The patient is not nervous/anxious.     Physical Exam: Vital signs: Vitals:   06/27/19 0035 06/27/19 0509  BP: (!) 151/81 (!) 145/81  Pulse: 71 68  Resp: 17 17  Temp: 98 F (36.7 C) 97.9 F (36.6 C)  SpO2: 98% 96%   Last BM Date: 06/25/19  Physical Exam Constitutional:      General: She is not in acute distress.    Appearance: She is well-developed. She is obese.  HENT:     Head: Normocephalic and atraumatic.     Nose: Nose normal.     Mouth/Throat:     Mouth: Mucous membranes are moist.     Pharynx: Oropharynx is clear.  Eyes:     General: No scleral icterus.    Extraocular Movements: Extraocular movements intact.     Conjunctiva/sclera: Conjunctivae normal.  Cardiovascular:     Rate and Rhythm: Normal rate and regular rhythm.     Pulses: Normal pulses.     Heart sounds: Normal heart sounds.  Pulmonary:     Effort: Pulmonary effort is normal. No respiratory distress.     Breath sounds: Normal breath sounds.  Abdominal:     General: Bowel sounds are normal. There is no distension.     Palpations: Abdomen is soft. There is no mass.     Tenderness: There is abdominal tenderness (mild, diffuse). There is no guarding or rebound.  Musculoskeletal:        General: No swelling or tenderness.     Cervical back: Normal range of motion and neck supple.     Right lower leg: No edema.     Left lower leg: No edema.  Skin:    General: Skin is warm and dry.  Neurological:     General: No focal  deficit present.     Mental Status: She is alert and oriented to person, place, and time.  Psychiatric:        Mood and Affect: Mood normal.        Behavior: Behavior normal.      GI:  Lab Results: Recent Labs    06/26/19 1745 06/27/19 0249  WBC 7.2  --   HGB 12.9 12.2  HCT 39.7 37.6  PLT 215  --    BMET Recent Labs    06/26/19 1745  NA 135  K 3.7  CL 104  CO2 21*  GLUCOSE 264*  BUN 19  CREATININE 1.26*  CALCIUM 8.8*   LFT Recent Labs    06/26/19 1745  PROT 6.6  ALBUMIN 4.0  AST 26  ALT 33  ALKPHOS 71  BILITOT 0.8   PT/INR No results for input(s): LABPROT, INR in the last 72 hours.   Studies/Results: CT ABDOMEN PELVIS WO CONTRAST  Result Date: 06/26/2019 CLINICAL DATA:  Passage of bright red blood per rectum which began Friday with recent colonoscopy performed last Wednesday. EXAM: CT ABDOMEN AND PELVIS WITHOUT CONTRAST TECHNIQUE: Multidetector CT imaging of the abdomen and pelvis was performed following the standard protocol without IV contrast. COMPARISON:  CT 12/15/2017 FINDINGS: Lower chest: Mild atelectatic changes in the OS clear lung bases. Cardiac size at the upper limits of normal. Trace pericardial fluid is within normal limits. Hepatobiliary: Prominent Riedel's lobe. Diffuse hepatic hypoattenuation compatible with hepatic steatosis. 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 No concerning focal liver lesions. There is mild gallbladder wall thickening though this is possibly related to underdistention in a nonfasting state. No pericholecystic fluid or inflammation. No visible calcified gallstones or biliary dilatation. Pancreas: Unremarkable. No pancreatic ductal dilatation or surrounding inflammatory changes. Spleen: Normal in size without focal abnormality. Adrenals/Urinary Tract: No concerning adrenal lesions. Prior bilateral nephrectomy and right lower quadrant renal transplant placement. No visible contour deforming lesion, urolithiasis or frank hydronephrosis of the  transplant kidney. Some mild pelviectasis of the transplant is slightly more pronounced than on prior though possibly related to bladder distension. No gross bladder abnormality. Stomach/Bowel: Distal esophagus is unremarkable. Small air and fluid-filled gastric diverticula along the posterior aspect of the gastric fundus measuring up to 2.5 cm in size. Previously demonstrates direct continuity on contrast-enhanced prior. Distal stomach and duodenum are unremarkable. No small bowel thickening or dilatation. No evidence of bowel obstruction. Prior right hemicolectomy with patent ileocolic anastomosis. A nodular density previously seen at the terminal ileum is less well visualized in the absence of enteric media though could correlate with recent colonoscopy. No colonic dilatation or wall thickening. Scattered colonic diverticula without focal pericolonic inflammation to suggest diverticulitis. Vascular/Lymphatic: No significant vascular findings are present. No enlarged abdominal or pelvic lymph nodes. Reproductive: Uterus is surgically absent. No concerning adnexal lesions. Other: Complex ventral hernia is noted with a large fascial defect of approximately 8.5 by 4.5 cm transverse by craniocaudal dimension (2/46, 6/75). A smaller upper abdominal ventral hernia with a 2.9 by 1.8 cm fascial defect is noted as well (2/23, 6/75). Hernia sacs contain fat without significant inflammation within the hernia contents to suggest active strangulation. No bowel containing hernias. No abdominopelvic free air or fluid. Musculoskeletal: No acute osseous abnormality or suspicious osseous lesion. Stable mild retrolisthesis L5 on S1 without pars defects. Additional mild degenerative changes in the lower  lumbar spine, hips and pelvis. IMPRESSION: 1. Evidence of prior right hemicolectomy with patent ileocolic anastomosis. A nodular density previously seen at the terminal ileum is less well visualized in the absence of enteric media  though could correlate with recent colonoscopy. 2. Colonic diverticulosis without evidence of diverticulitis. Colonic diverticula may bleed in the absence of a inflammation. 3. Complex ventral hernia containing fat without significant inflammation within the hernia contents to suggest active strangulation. 4. Prior bilateral nephrectomy and right lower quadrant renal transplant placement. Some mild pelviectasis of the transplant kidney is slightly more pronounced than on prior though possibly related to bladder distension. Consider urinalysis to exclude infection or cystitis. 5. Hepatic steatosis. 6. Small air and fluid-filled gastric diverticula along the posterior aspect of the gastric fundus measuring up to 2.5 cm in size. 7. Aortic Atherosclerosis (ICD10-I70.0). Electronically Signed   By: Lovena Le M.D.   On: 06/26/2019 19:35    Impression: Post-colonoscopy bleeding x 6 days, most consistent with diverticular bleeding -Hemoglobin 12.0, mildly decreased from yesterday (12.9) but remains normal -Sigmoid diverticulosis noted on colonoscopy 6/9.  No polyps and no specimen collection. -CT 6/16 showed small air and fluid-filled gastric diverticula along the posterior aspect of the gastric fundus measuring up to 2.5 cm in size. No other acute gastrointestinal abnormalities noted.  Neuroendocrine tumor s/p right colectomy -Patent ileocolonic anastomosis without any abnormalities seen on colonoscopy 6/9   Bilateral nephrectomy s/p right kidney transplant -BUN 19/Cr 1.26 with GFR of 49 as of 6/16  Plan: Nuclear bleeding scan with consideration of IR embolization if positive.  Continue to monitor H&H with transfusion as needed to maintain hemoglobin greater than 7.  Keep NPO for now.  Consider advancement of diet if negative bleeding scan.  [facility] will follow.   LOS: 1 day   Salley Slaughter  PA-C 06/27/2019, 8:35 AM  Contact #  [phone]

## 2019-06-27 NOTE — Plan of Care (Signed)

## 2019-06-27 NOTE — Progress Notes (Signed)
PROGRESS NOTE    [name] [name]  XTA:[id number] DOB: [birth year] DOA: 06/26/2019 PCP: Orma Flaming, MD   Brief Narrative:  [name] [name] is a 54 y.o. female with medical history significant for diabetes, kidney transplant secondary to ESRD from polycystic kidney disease, status post  hemicolectomy at [place] a year ago for neuroendocrine tumor who presents in transfer from [place] for acute GI bleeding that started 2 days following routine surveillance colonoscopy on 06/19/2019.  Patient has been having bright red blood per rectum intermittently,associated with lower abdominal cramping of moderate intensity. No aggravating or alleviating factors to the pain. She had a single episode of vomiting 1 day ago.  Last bloody BM was about 16 hours prior to arrival.  Has had no fever or chills. Denies chest pain shortness of breath or palpitations or lightheadedness.In [place] hemoglobin was 12.9, her baseline, , creatinine 1.26 slightly above baseline of 1.17, blood sugar 264. Patient spoke to her gastroenterologist at [facility] who advised her to come into the ER for evaluation.  The emergency room provider at [facility] spoke with patient's gastroenterologist at [facility] who advised that patient could be admitted locally. He subsequently spoke with GI Dr. Alessandra Bevels who accepted patient. Patient is admitted to the hospitalist service.  Assessment & Plan:   Principal Problem:   Acute GI bleeding Active Problems:   Diabetes mellitus type 2, insulin dependent ([place])   Kidney replaced by transplant   Acute lower gastrointestinal bleeding   Obesity, Class III, BMI 40-49.9 (morbid obesity) ([place])   H/O right hemicolectomy   Acute GI bleeding in the setting of recent colonoscopy - likely diverticular in nature, POA -Patient with history of right hemicolectomy for neuroendocrine tumor 2019, followed at [facility], status post recent colonoscopy on 06/19/2019, now presenting with bright red blood per rectum -  likely diverticular per GI - GI following - appreciate insight and expertise -CT abdomen and pelvis showing patent ileocolic anastomosis, diverticulosis without evidence of diverticulitis, among other findings CBC Latest Ref Rng & Units 06/27/2019 06/27/2019 06/27/2019  WBC 4.0 - 10.5 K/uL - - -  Hemoglobin 12.0 - 15.0 g/dL 12.6 12.0 12.2  Hematocrit 36 - 46 % 38.2 36.8 37.6  Platelets 150 - 400 K/uL - - -  - Tagged RBC scan negative - Advance diet per GI - will start clears and follow clinically  History of right hemicolectomy secondary to neuroendocrine tumor -Followed at [facility] and had recent surveillance colonoscopy on 06/19/2019 -No acute issues other than above.  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  Morbid obesity, BMI 47 -This complicates overall prognosis and care  DVT prophylaxis: SCDs only given BRBPR as above Code Status: full code  Family Communication:  none  Consults called: Gi, Dr Layla Maw Status is: Transition to inpatient given ongoing need for evaluation, imaging and possible intervention  Dispo: The patient is from: home              Anticipated d/c is to: home              Anticipated d/c date is: 24-48h pending clinical status/workup  Patient currently NOT medically stable for discharge due to ongoing need for workup and evaluation with GI and possible further imaging, blood transfusion, or intervention.  Procedures:   None planned  Antimicrobials:  None currently indicated   Subjective: No acute issues/events this am - denies chest pain, syncope, shortness of  breath, nausea, vomiting, diarrhea, or constipation. Indicates bright red BM have improved over the past few hours as has her previous cramping after treatment.   Objective: Vitals:   06/26/19 2021 06/26/19 2138 06/27/19 0035 06/27/19 0509  BP: 127/81 (!) 145/93 (!) 151/81 (!) 145/81  Pulse: 79 83 71 68  Resp: 18 16 17 17   Temp:   98 F (36.7 C) 97.9 F (36.6 C)  TempSrc:      SpO2: 99% 99% 98% 96%  Weight:   130.8 kg   Height:        Intake/Output Summary (Last 24 hours) at 06/27/2019 0915 Last data filed at 06/27/2019 0400 Gross per 24 hour  Intake 278.89 ml  Output --  Net 278.89 ml   Filed Weights   06/26/19 1610 06/27/19 0035  Weight: 131.4 kg 130.8 kg    Examination:  General exam: Appears calm and comfortable  Respiratory system: Clear to auscultation. Respiratory effort normal. Cardiovascular system: S1 & S2 heard, RRR. No JVD, murmurs, rubs, gallops or clicks. No pedal edema. Gastrointestinal system: Abdomen is nondistended, soft and nontender. No organomegaly or masses felt. Normal bowel sounds heard. Central nervous system: Alert and oriented. No focal neurological deficits. Extremities: Symmetric 5 x 5 power. Skin: No rashes, lesions or ulcers Psychiatry: Judgement and insight appear normal. Mood & affect appropriate.   Data Reviewed: I have personally reviewed following labs and imaging studies  CBC: Recent Labs  Lab 06/26/19 1745 06/27/19 0249 06/27/19 0800  WBC 7.2  --   --   HGB 12.9 12.2 12.0  HCT 39.7 37.6 36.8  MCV 89.6  --   --   PLT 215  --   --    Basic Metabolic Panel: Recent Labs  Lab 06/26/19 1745  NA 135  K 3.7  CL 104  CO2 21*  GLUCOSE 264*  BUN 19  CREATININE 1.26*  CALCIUM 8.8*   GFR: Estimated Creatinine Clearance: 71.1 mL/min (A) (by C-G formula based on SCr of 1.26 mg/dL (H)). Liver Function Tests: Recent Labs  Lab 06/26/19 1745  AST 26  ALT 33  ALKPHOS 71  BILITOT 0.8  PROT 6.6  ALBUMIN 4.0   Recent Labs   Lab 06/26/19 1745  LIPASE 33   No results for input(s): AMMONIA in the last 168 hours. Coagulation Profile: No results for input(s): INR, PROTIME in the last 168 hours. Cardiac Enzymes: No results for input(s): CKTOTAL, CKMB, CKMBINDEX, TROPONINI in the last 168 hours. BNP (last 3 results) No results for input(s): PROBNP in the last 8760 hours. HbA1C: No results for input(s): HGBA1C in the last 72 hours. CBG: Recent Labs  Lab 06/27/19 0154 06/27/19 0459 06/27/19 0747  GLUCAP 144* 147* 160*   Lipid Profile: No results for input(s): CHOL, HDL, LDLCALC, TRIG, CHOLHDL, LDLDIRECT in the last 72 hours. Thyroid Function Tests: No results for input(s): TSH, T4TOTAL, FREET4, T3FREE, THYROIDAB in the last 72 hours. Anemia Panel: No results for input(s): VITAMINB12, FOLATE, FERRITIN, TIBC, IRON, RETICCTPCT in the last 72 hours. Sepsis Labs: No results for input(s): PROCALCITON, LATICACIDVEN in the last 168 hours.  Recent Results (from the past 240 hour(s))  SARS Coronavirus 2 by RT PCR (  hospital order, performed in [facility] hospital lab) Nasopharyngeal Nasopharyngeal Swab     Status: None   Collection Time: 06/26/19  9:27 PM   Specimen: Nasopharyngeal Swab  Result Value Ref Range Status   SARS Coronavirus 2 NEGATIVE NEGATIVE Final    Comment: (NOTE) SARS-CoV-2 target nucleic acids are NOT DETECTED.  The SARS-CoV-2 RNA is generally detectable in upper and lower respiratory specimens during the acute phase of infection. The lowest concentration of SARS-CoV-2 viral copies this assay can detect is 250 copies / mL. A negative result does not preclude SARS-CoV-2 infection and should not be used as the sole basis for treatment or other patient management decisions.  A negative result may occur with improper specimen collection / handling, submission of specimen other than nasopharyngeal swab, presence of viral mutation(s) within the areas targeted by this assay, and inadequate  number of viral copies (<250 copies / mL). A negative result must be combined with clinical observations, patient history, and epidemiological information.  Fact Sheet for Patients:   StrictlyIdeas.no  Fact Sheet for Healthcare Providers: BankingDealers.co.za  This test is not yet approved or  cleared by the [place] FDA and has been authorized for detection and/or diagnosis of SARS-CoV-2 by FDA under an Emergency Use Authorization (EUA).  This EUA will remain in effect (meaning this test can be used) for the duration of the COVID-19 declaration under Section 564(b)(1) of the Act, 21 U.S.C. section 360bbb-3(b)(1), unless the authorization is terminated or revoked sooner.  Performed at [facility], [address], [place], [place] [address]          Radiology Studies: CT ABDOMEN PELVIS WO CONTRAST  Result Date: 06/26/2019 CLINICAL DATA:  Passage of bright red blood per rectum which began Friday with recent colonoscopy performed last Wednesday. EXAM: CT ABDOMEN AND PELVIS WITHOUT CONTRAST TECHNIQUE: Multidetector CT imaging of the abdomen and pelvis was performed following the standard protocol without IV contrast. COMPARISON:  CT 12/15/2017 FINDINGS: Lower chest: Mild atelectatic changes in the OS clear lung bases. Cardiac size at the upper limits of normal. Trace pericardial fluid is within normal limits. Hepatobiliary: Prominent Riedel's lobe. Diffuse hepatic hypoattenuation compatible with hepatic steatosis. 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 No concerning focal liver lesions. There is mild gallbladder wall thickening though this is possibly related to underdistention in a nonfasting state. No pericholecystic fluid or inflammation. No visible calcified  gallstones or biliary dilatation. Pancreas: Unremarkable. No pancreatic ductal dilatation or surrounding inflammatory changes. Spleen: Normal in size without focal abnormality. Adrenals/Urinary Tract: No concerning adrenal lesions. Prior bilateral nephrectomy and right lower quadrant renal transplant placement. No visible contour deforming lesion, urolithiasis or frank hydronephrosis of the transplant kidney. Some mild pelviectasis of the transplant is slightly more pronounced than on prior though possibly related to bladder distension. No gross bladder abnormality. Stomach/Bowel: Distal esophagus is unremarkable. Small air and fluid-filled gastric diverticula along the posterior aspect of the gastric fundus measuring up to 2.5 cm in size. Previously demonstrates direct continuity on contrast-enhanced prior. Distal stomach and duodenum are unremarkable. No small bowel thickening or dilatation. No evidence of bowel obstruction. Prior right hemicolectomy with patent ileocolic anastomosis. A nodular density previously seen at the terminal ileum is less well visualized in the absence of enteric media though could correlate with recent colonoscopy. No colonic dilatation or wall thickening. Scattered colonic diverticula  without focal pericolonic inflammation to suggest diverticulitis. Vascular/Lymphatic: No significant vascular findings are present. No enlarged abdominal or pelvic lymph nodes. Reproductive: Uterus is surgically absent. No concerning adnexal lesions. Other: Complex ventral hernia is noted with a large fascial defect of approximately 8.5 by 4.5 cm transverse by craniocaudal dimension (2/46, 6/75). A smaller upper abdominal ventral hernia with a 2.9 by 1.8 cm fascial defect is noted as well (2/23, 6/75). Hernia sacs contain fat without significant inflammation within the hernia contents to suggest active strangulation. No bowel containing hernias. No abdominopelvic free air or fluid. Musculoskeletal: No  acute osseous abnormality or suspicious osseous lesion. Stable mild retrolisthesis L5 on S1 without pars defects. Additional mild degenerative changes in the lower lumbar spine, hips and pelvis. IMPRESSION: 1. Evidence of prior right hemicolectomy with patent ileocolic anastomosis. A nodular density previously seen at the terminal ileum is less well visualized in the absence of enteric media though could correlate with recent colonoscopy. 2. Colonic diverticulosis without evidence of diverticulitis. Colonic diverticula may bleed in the absence of a inflammation. 3. Complex ventral hernia containing fat without significant inflammation within the hernia contents to suggest active strangulation. 4. Prior bilateral nephrectomy and right lower quadrant renal transplant placement. Some mild pelviectasis of the transplant kidney is slightly more pronounced than on prior though possibly related to bladder distension. Consider urinalysis to exclude infection or cystitis. 5. Hepatic steatosis. 6. Small air and fluid-filled gastric diverticula along the posterior aspect of the gastric fundus measuring up to 2.5 cm in size. 7. Aortic Atherosclerosis (ICD10-I70.0). Electronically Signed   By: Lovena Le M.D.   On: 06/26/2019 19:35        Scheduled Meds: . atorvastatin  40 mg Oral QHS  . insulin aspart  0-15 Units Subcutaneous Q4H  . mycophenolate  360 mg Oral BID  . tacrolimus  1 mg Oral BID   Continuous Infusions: . sodium chloride 100 mL/hr at 06/27/19 0112     LOS: 1 day   Time spent: 20 min  Little Ishikawa, DO [facility]  If 7PM-7AM, please contact night-coverage [email or web address]  06/27/2019, 9:15 AM

## 2019-06-28 LAB — GLUCOSE, CAPILLARY
Glucose-Capillary: 136 mg/dL — ABNORMAL HIGH (ref 70–99)
Glucose-Capillary: 139 mg/dL — ABNORMAL HIGH (ref 70–99)
Glucose-Capillary: 160 mg/dL — ABNORMAL HIGH (ref 70–99)

## 2019-06-28 MED ORDER — PSYLLIUM 95 % PO PACK: 1.0000 | PACK | Freq: Two times a day (BID) | ORAL | 0 refills | Status: DC

## 2019-06-28 MED ORDER — SENNOSIDES-DOCUSATE SODIUM 8.6-50 MG PO TABS: 1.0000 | ORAL_TABLET | Freq: Two times a day (BID) | ORAL | Status: DC

## 2019-06-28 MED ORDER — PSYLLIUM 95 % PO PACK: 1.0000 | PACK | Freq: Two times a day (BID) | ORAL | Status: DC

## 2019-06-28 NOTE — Progress Notes (Signed)
Subjective: Patient has not had a bowel movement since admission. Her bleeding scan yesterday was negative. She wants to go home. Denies abdominal pain.  Objective: Vital signs in last 24 hours: Temp:  [98 F (36.7 C)-98.2 F (36.8 C)] 98 F (36.7 C) (06/18 0405) Pulse Rate:  [60-72] 72 (06/18 0405) Resp:  [17] 17 (06/18 0405) BP: (122-142)/(69-78) 136/78 (06/18 0405) SpO2:  [96 %-99 %] 96 % (06/18 0405) Weight change:  Last BM Date: 06/25/19  PE: Obese, not in discomfort GENERAL: No pallor, no icterus ABDOMEN: Soft, nondistended, nontender, hypoactive bowel sounds, multiple surgical scars EXTREMITIES: No deformity, no edema  Lab Results: Results for orders placed or performed during the hospital encounter of 06/26/19 (from the past 48 hour(s))  Urinalysis, Routine w reflex microscopic     Status: Abnormal   Collection Time: 06/26/19  4:36 PM  Result Value Ref Range   Color, Urine YELLOW YELLOW   APPearance CLEAR CLEAR   Specific Gravity, Urine >1.030 (H) 1.005 - 1.030   pH 5.0 5.0 - 8.0   Glucose, UA >=500 (A) NEGATIVE mg/dL   Hgb urine dipstick NEGATIVE NEGATIVE   Bilirubin Urine NEGATIVE NEGATIVE   Ketones, ur NEGATIVE NEGATIVE mg/dL   Protein, ur NEGATIVE NEGATIVE mg/dL   Nitrite NEGATIVE NEGATIVE   Leukocytes,Ua SMALL (A) NEGATIVE    Comment: Performed at [facility], [address] [place], [place] [address]  Pregnancy, urine     Status: None   Collection Time: 06/26/19  4:36 PM  Result Value Ref Range   Preg Test, Ur NEGATIVE NEGATIVE    Comment:        THE SENSITIVITY OF THIS METHODOLOGY IS >20 mIU/mL. Performed at [facility], [address] [place], [place] [address]   Urinalysis, Microscopic (reflex)     Status: Abnormal   Collection Time: 06/26/19  4:36 PM  Result Value Ref Range   RBC / HPF 0-5 0 - 5 RBC/hpf   WBC, UA 6-10 0 - 5 WBC/hpf   Bacteria, UA FEW (A) NONE SEEN   Squamous Epithelial / LPF 6-10 0 - 5    Comment:  Performed at [facility], [address] [place], [place] [address]  Lipase, blood     Status: None   Collection Time: 06/26/19  5:45 PM  Result Value Ref Range   Lipase 33 11 - 51 U/L    Comment: Performed at [facility], [address] [place], [place] [address]  Comprehensive metabolic panel     Status: Abnormal   Collection Time: 06/26/19  5:45 PM  Result Value Ref Range   Sodium 135 135 - 145 mmol/L   Potassium 3.7 3.5 - 5.1 mmol/L   Chloride 104 98 - 111 mmol/L   CO2 21 (L) 22 - 32 mmol/L   Glucose, Bld 264 (H) 70 - 99 mg/dL    Comment: Glucose reference range applies only to samples taken after fasting for at least 8 hours.   BUN 19 6 - 20 mg/dL   Creatinine, Ser 1.26 (H) 0.44 - 1.00 mg/dL   Calcium 8.8 (L) 8.9 - 10.3 mg/dL   Total Protein 6.6 6.5 - 8.1 g/dL   Albumin 4.0 3.5 - 5.0 g/dL   AST 26 15 - 41 U/L   ALT 33 0 - 44 U/L   Alkaline Phosphatase 71 38 - 126 U/L   Total Bilirubin 0.8 0.3 - 1.2 mg/dL   GFR  calc non Af Amer 49 (L) >60 mL/min   GFR calc Af Amer 56 (L) >60 mL/min   Anion gap 10 5 - 15    Comment: Performed at [facility], [address] [place], [place] [address]  CBC     Status: None   Collection Time: 06/26/19  5:45 PM  Result Value Ref Range   WBC 7.2 4.0 - 10.5 K/uL   RBC 4.43 3.87 - 5.11 MIL/uL   Hemoglobin 12.9 12.0 - 15.0 g/dL   HCT 39.7 36 - 46 %   MCV 89.6 80.0 - 100.0 fL   MCH 29.1 26.0 - 34.0 pg   MCHC 32.5 30.0 - 36.0 g/dL   RDW 12.9 11.5 - 15.5 %   Platelets 215 150 - 400 K/uL   nRBC 0.0 0.0 - 0.2 %    Comment: Performed at [facility], [address] [place], [place] [address]  SARS Coronavirus 2 by RT PCR (hospital order, performed in [facility] hospital lab) Nasopharyngeal Nasopharyngeal Swab     Status: None   Collection Time: 06/26/19  9:27 PM   Specimen: Nasopharyngeal Swab  Result Value Ref Range   SARS Coronavirus 2 NEGATIVE NEGATIVE    Comment: (NOTE) SARS-CoV-2 target  nucleic acids are NOT DETECTED.  The SARS-CoV-2 RNA is generally detectable in upper and lower respiratory specimens during the acute phase of infection. The lowest concentration of SARS-CoV-2 viral copies this assay can detect is 250 copies / mL. A negative result does not preclude SARS-CoV-2 infection and should not be used as the sole basis for treatment or other patient management decisions.  A negative result may occur with improper specimen collection / handling, submission of specimen other than nasopharyngeal swab, presence of viral mutation(s) within the areas targeted by this assay, and inadequate number of viral copies (<250 copies / mL). A negative result must be combined with clinical observations, patient history, and epidemiological information.  Fact Sheet for Patients:   StrictlyIdeas.no  Fact Sheet for Healthcare Providers: BankingDealers.co.za  This test is not yet approved or  cleared by the [place] FDA and has been authorized for detection and/or diagnosis of SARS-CoV-2 by FDA under an Emergency Use Authorization (EUA).  This EUA will remain in effect (meaning this test can be used) for the duration of the COVID-19 declaration under Section 564(b)(1) of the Act, 21 U.S.C. section 360bbb-3(b)(1), unless the authorization is terminated or revoked sooner.  Performed at [facility], [address] [place], [place] [address]   Occult blood card to lab, stool Provider will collect     Status: None   Collection Time: 06/26/19  9:30 PM  Result Value Ref Range   Fecal Occult Bld NEGATIVE NEGATIVE    Comment: Performed at [facility], [address] [place], [place] [address]  Glucose, capillary     Status: Abnormal   Collection Time: 06/27/19  1:54 AM  Result Value Ref Range   Glucose-Capillary 144 (H) 70 - 99 mg/dL    Comment: Glucose reference range applies only to samples taken after  fasting for at least 8 hours.  HIV Antibody (routine testing w rflx)     Status: None   Collection Time: 06/27/19  2:49 AM  Result Value Ref Range   HIV Screen 4th Generation wRfx Non Reactive Non Reactive    Comment: Performed at [facility], [place] [address] [place], [place] [address]  Type and screen Bertie     Status: None   Collection Time: 06/27/19  2:49 AM  Result Value Ref Range   ABO/RH(D) A POS    Antibody Screen NEG    Sample Expiration      06/30/2019,2359 Performed at [facility], [place] [address], [place], [place] [address]   Hemoglobin and hematocrit, blood     Status: None   Collection Time: 06/27/19  2:49 AM  Result Value Ref Range   Hemoglobin 12.2 12.0 - 15.0 g/dL   HCT 37.6 36 - 46 %    Comment: Performed at [facility] [address], [place], [place] [address]  ABO/Rh     Status: None   Collection Time: 06/27/19  2:49 AM  Result Value Ref Range   ABO/RH(D)      A POS Performed at [facility] [address], [place], [place] [address]   Glucose, capillary     Status: Abnormal   Collection Time: 06/27/19  4:59 AM  Result Value Ref Range   Glucose-Capillary 147 (H) 70 - 99 mg/dL    Comment: Glucose reference range applies only to samples taken after fasting for at least 8 hours.  Glucose, capillary     Status: Abnormal   Collection Time: 06/27/19  7:47 AM  Result Value Ref Range   Glucose-Capillary 160 (H) 70 - 99 mg/dL    Comment: Glucose reference range applies only to samples taken after fasting for at least 8 hours.  Hemoglobin and hematocrit, blood     Status: None   Collection Time: 06/27/19  8:00 AM  Result Value Ref Range   Hemoglobin 12.0 12.0 - 15.0 g/dL   HCT 36.8 36 - 46 %    Comment: Performed at [facility] [address], [place], [place] [address]  Hemoglobin and hematocrit, blood     Status: None   Collection Time: 06/27/19  2:29 PM  Result Value Ref Range   Hemoglobin 12.6 12.0  - 15.0 g/dL   HCT 38.2 36 - 46 %    Comment: Performed at [facility] [address], [place], [place] [address]  Glucose, capillary     Status: Abnormal   Collection Time: 06/27/19  2:31 PM  Result Value Ref Range   Glucose-Capillary 131 (H) 70 - 99 mg/dL    Comment: Glucose reference range applies only to samples taken after fasting for at least 8 hours.  Glucose, capillary     Status: Abnormal   Collection Time: 06/27/19  4:32 PM  Result Value Ref Range   Glucose-Capillary 130 (H) 70 - 99 mg/dL    Comment: Glucose reference range applies only to samples taken after fasting for at least 8 hours.  Hemoglobin and hematocrit, blood     Status: Abnormal   Collection Time: 06/27/19  8:06 PM  Result Value Ref Range   Hemoglobin 11.9 (L) 12.0 - 15.0 g/dL   HCT 36.5 36 - 46 %    Comment: Performed at [facility] [address], [place], [place] [address]  Glucose, capillary     Status: Abnormal   Collection Time: 06/27/19  8:06 PM  Result Value Ref Range   Glucose-Capillary 147 (H) 70 - 99 mg/dL    Comment: Glucose reference range applies only to samples taken after fasting for at least 8 hours.  Glucose, capillary     Status: Abnormal   Collection Time:  06/28/19 12:03 AM  Result Value Ref Range   Glucose-Capillary 136 (H) 70 - 99 mg/dL    Comment: Glucose reference range applies only to samples taken after fasting for at least 8 hours.  Glucose, capillary     Status: Abnormal   Collection Time: 06/28/19  4:02 AM  Result Value Ref Range   Glucose-Capillary 139 (H) 70 - 99 mg/dL    Comment: Glucose reference range applies only to samples taken after fasting for at least 8 hours.  Glucose, capillary     Status: Abnormal   Collection Time: 06/28/19  7:33 AM  Result Value Ref Range   Glucose-Capillary 160 (H) 70 - 99 mg/dL    Comment: Glucose reference range applies only to samples taken after fasting for at least 8 hours.    Studies/Results: CT ABDOMEN PELVIS WO  CONTRAST  Result Date: 06/26/2019 CLINICAL DATA:  Passage of bright red blood per rectum which began Friday with recent colonoscopy performed last Wednesday. EXAM: CT ABDOMEN AND PELVIS WITHOUT CONTRAST TECHNIQUE: Multidetector CT imaging of the abdomen and pelvis was performed following the standard protocol without IV contrast. COMPARISON:  CT 12/15/2017 FINDINGS: Lower chest: Mild atelectatic changes in the OS clear lung bases. Cardiac size at the upper limits of normal. Trace pericardial fluid is within normal limits. Hepatobiliary: Prominent Riedel's lobe. Diffuse hepatic hypoattenuation compatible with hepatic steatosis. 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 No concerning focal liver lesions. There is mild gallbladder wall thickening though this is possibly related to underdistention in a nonfasting state. No pericholecystic fluid or inflammation. No visible calcified gallstones or biliary dilatation. Pancreas: Unremarkable. No pancreatic ductal dilatation or surrounding inflammatory changes. Spleen: Normal in size without focal abnormality. Adrenals/Urinary Tract: No concerning adrenal lesions. Prior bilateral nephrectomy and right lower quadrant renal transplant placement. No visible contour deforming lesion, urolithiasis or frank hydronephrosis of the transplant kidney. Some mild pelviectasis of the transplant is slightly more pronounced than on prior though possibly related to bladder distension. No gross bladder abnormality. Stomach/Bowel: Distal esophagus is unremarkable. Small air and fluid-filled gastric diverticula along the posterior aspect of the gastric fundus measuring up to 2.5 cm in size. Previously demonstrates direct continuity on contrast-enhanced prior. Distal stomach and duodenum are unremarkable. No small bowel thickening or  dilatation. No evidence of bowel obstruction. Prior right hemicolectomy with patent ileocolic anastomosis. A nodular density previously seen at the terminal ileum is less well visualized in the absence of enteric media though could correlate with recent colonoscopy. No colonic dilatation or wall thickening. Scattered colonic diverticula without focal pericolonic inflammation to suggest diverticulitis. Vascular/Lymphatic: No significant vascular findings are present. No enlarged abdominal or pelvic lymph nodes. Reproductive: Uterus is surgically absent. No concerning adnexal lesions. Other: Complex ventral hernia is noted with a large fascial defect of approximately 8.5 by 4.5 cm transverse by craniocaudal dimension (2/46, 6/75). A smaller upper abdominal ventral hernia with a 2.9 by 1.8 cm fascial defect is noted as well (2/23, 6/75). Hernia sacs contain fat without significant inflammation within the hernia contents to suggest active strangulation. No bowel containing hernias. No abdominopelvic free air or fluid. Musculoskeletal: No acute osseous abnormality or suspicious osseous lesion. Stable mild retrolisthesis L5 on S1 without pars defects. Additional mild degenerative changes in the lower lumbar spine, hips and pelvis. IMPRESSION: 1. Evidence of prior right hemicolectomy with patent ileocolic anastomosis. A nodular  density previously seen at the terminal ileum is less well visualized in the absence of enteric media though could correlate with recent colonoscopy. 2. Colonic diverticulosis without evidence of diverticulitis. Colonic diverticula may bleed in the absence of a inflammation. 3. Complex ventral hernia containing fat without significant inflammation within the hernia contents to suggest active strangulation. 4. Prior bilateral nephrectomy and right lower quadrant renal transplant placement. Some mild pelviectasis of the transplant kidney is slightly more pronounced than on prior though possibly  related to bladder distension. Consider urinalysis to exclude infection or cystitis. 5. Hepatic steatosis. 6. Small air and fluid-filled gastric diverticula along the posterior aspect of the gastric fundus measuring up to 2.5 cm in size. 7. Aortic Atherosclerosis (ICD10-I70.0). Electronically Signed   By: Lovena Le M.D.   On: 06/26/2019 19:35   NM GI Blood Loss  Result Date: 06/27/2019 CLINICAL DATA:  History of ileal carcinoid with GI bleeding for 2 days EXAM: NUCLEAR MEDICINE GASTROINTESTINAL BLEEDING SCAN TECHNIQUE: Sequential abdominal images were obtained following intravenous administration of Tc-29m labeled red blood cells. RADIOPHARMACEUTICALS:  23.8 mCi Tc-36m pertechnetate in-vitro labeled red cells. COMPARISON:  CT from the previous day. FINDINGS: There is adequate uptake of radioactive tracer throughout the vascular pool. Activity in the bladder is noted. Pooling in the spleen is seen as well. Diffuse mild increased activity is noted in the right lower quadrant in the expected region of prior ileocecal surgery. When compared with the prior CT examination this reflects blush within the right kidney which is pelvic in location consistent with prior transplant. No focal areas of increased activity are identified to suggest active extravasation. IMPRESSION: No evidence of acute GI hemorrhage. Electronically Signed   By: Inez Catalina M.D.   On: 06/27/2019 15:43    Medications: I have reviewed the patient's current medications.  Assessment: Hematochezia-possible diverticular bleed Bleeding scan negative for active bleeding Hemoglobin has remained stable  Plan: Start regular diet, okay to DC home from GI standpoint Advised patient to take high-fiber diet, increase intake of fruits, vegetables, whole grains, to drink at least 60 to 80 ounces of water a day, okay to use bulk forming agent such as Metamucil/Benefiber/psyllium husk on a regular basis.  Ronnette Juniper, MD 06/28/2019, 9:14 AM

## 2019-06-28 NOTE — Progress Notes (Signed)
[name] to be D/C'd  per MD order. Discussed with the patient and all questions fully answered.  VSS, Skin clean, dry and intact without evidence of skin break down, no evidence of skin tears noted.  IV catheter discontinued intact. Site without signs and symptoms of complications. Dressing and pressure applied.  An After Visit Summary was printed and given to the patient. Patient received prescription.  D/c education completed with patient/family including follow up instructions, medication list, d/c activities limitations if indicated, with other d/c instructions as indicated by MD - patient able to verbalize understanding, all questions fully answered.   Patient instructed to return to ED, call 911, or call MD for any changes in condition.   Patient to be escorted via [place], and D/C home via private auto.

## 2019-06-28 NOTE — Discharge Summary (Signed)
Physician Discharge Summary  [name] [name] TFT:[id number] DOB: July 26, 1965 DOA: 06/26/2019  PCP: Orma Flaming, MD  Admit date: 06/26/2019 Discharge date: 06/28/2019  Admitted From: Home Disposition: Home  Recommendations for Outpatient Follow-up:  1. Follow up with PCP, GI, nephrology in 1-2 weeks as scheduled 2. 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 No aggravating or alleviating factors to the pain. She had a single episode of vomiting 1 day ago.Last bloody BM was about 16 hours prior to arrival. Has had no fever or chills. Denies chest pain shortness of breath or palpitations or lightheadedness.In [place] hemoglobin was12.9, her baseline,, creatinine 1.26slightly above baseline of 1.17, blood sugar 264. Patient spoke to her gastroenterologist at [facility] who advised her to come into the ER for evaluation. The emergency room provider at [facility] spoke with patient's gastroenterologist at [facility] who advised that patient could be admitted locally. He subsequently spoke with GI Dr. Alessandra Bevels who accepted patient. Patient isadmitted to the hospitalist service  Patient admitted as  above with concern for GI bleeding that improved quite drastically over the past 48 hours.  GI following along indicates patient's post colonoscopy bleeding was likely in the setting of diverticular bleed which appears to have now resolved given patient stable H&H and no further episodes of dark stool, bright red blood per rectum.  Bleeding scan was negative as below.  Resolution of symptoms, feels quite well we discussed discharge today, GI recommending high-fiber diet as above, patient understands she would have recurrent issues of bleeding or worsening symptoms she should report back to the ED.  Otherwise recommend close follow-up with PCP, GI, nephrology in the outpatient setting as scheduled.  Discharge Diagnoses:  Principal Problem:   Acute GI bleeding Active Problems:   Diabetes mellitus type 2, insulin dependent ([place])   Kidney replaced by transplant   Acute lower gastrointestinal bleeding   Obesity, Class III, BMI 40-49.9 (morbid obesity) ([place])   H/O right hemicolectomy   GI bleed    Discharge Instructions  Discharge Instructions    Call MD for:  difficulty breathing, headache or visual disturbances   Complete by: As directed    Call MD for:  extreme fatigue   Complete by: As directed    Call MD for:  hives   Complete by: As directed    Call MD for:  persistant dizziness or light-headedness   Complete by: As directed    Call MD for:  persistant nausea and vomiting   Complete by: As directed    Call MD for:  severe uncontrolled pain   Complete by: As directed    Call MD for:  temperature >100.4   Complete by: As directed    Diet - low sodium heart healthy   Complete by: As directed    Increase activity slowly   Complete by:  As directed      Allergies as of 06/28/2019      Reactions   Tape Rash, Other (See Comments)   Tears the skin- please use an alternative   Vancomycin Hives, Itching, Other (See Comments), Rash   Received via IV      Medication List    TAKE  these medications   acetaminophen 325 MG tablet Commonly known as: TYLENOL Take 650 mg by mouth daily as needed for mild pain or headache.   aspirin 81 MG EC tablet Take 81 mg by mouth daily.   atorvastatin 40 MG tablet Commonly known as: LIPITOR Take 40 mg by mouth at bedtime.   B-D UF III MINI PEN NEEDLES 31G X 5 MM Misc Generic drug: Insulin Pen Needle USE AS DIRECTED ONCE DAILY   Contour Next Test test strip Generic drug: glucose blood 3 (three) times daily.   docusate sodium 100 MG capsule Commonly known as: COLACE Take 100 mg by mouth 2 (two) times daily.   insulin aspart protamine- aspart (70-30) 100 UNIT/ML injection Commonly known as: NOVOLOG MIX 70/30 Inject 25 Units into the skin See admin instructions. Inject 25 units into the skin in the morning and 25 units after supper   Myfortic 360 MG Tbec EC tablet Generic drug: mycophenolate Take 360 mg by mouth 2 (two) times daily.   Prograf 1 MG capsule Generic drug: tacrolimus Take 1 mg by mouth 2 (two) times daily.   psyllium 95 % Pack Commonly known as: HYDROCIL/METAMUCIL Take 1 packet by mouth 2 (two) times daily.   Vitamin D3 50 MCG (2000 UT) Tabs Take 2,000 Units by mouth in the morning, at noon, and at bedtime.       Allergies  Allergen Reactions  . Tape Rash and Other (See Comments)    Tears the skin- please use an alternative  . Vancomycin Hives, Itching, Other (See Comments) and Rash    Received via IV    Consultations:  GI Dr. Therisa Doyne   Procedures/Studies: CT ABDOMEN PELVIS WO CONTRAST  Result Date: 06/26/2019 CLINICAL DATA:  Passage of bright red blood per rectum which began Friday with recent colonoscopy performed last Wednesday. EXAM: CT ABDOMEN AND PELVIS WITHOUT CONTRAST TECHNIQUE: Multidetector CT imaging of the abdomen and pelvis was performed following the standard protocol without IV contrast. COMPARISON:  CT 12/15/2017 FINDINGS: Lower chest: Mild atelectatic changes in the OS clear  lung bases. Cardiac size at the upper limits of normal. Trace pericardial fluid is within normal limits. Hepatobiliary: Prominent Riedel's lobe. Diffuse hepatic hypoattenuation compatible with hepatic steatosis. 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 No concerning focal liver lesions. There is mild gallbladder wall thickening though this is possibly related to underdistention in a nonfasting state. No pericholecystic fluid or inflammation. No visible calcified gallstones or biliary dilatation. Pancreas: Unremarkable. No pancreatic ductal dilatation or surrounding inflammatory changes. Spleen: Normal in size without focal abnormality. Adrenals/Urinary Tract: No concerning adrenal lesions. Prior bilateral nephrectomy and right lower quadrant renal transplant placement. No visible contour deforming lesion, urolithiasis or frank hydronephrosis of the transplant kidney. Some mild pelviectasis of the transplant is slightly more pronounced than on prior though possibly related to bladder distension. No gross bladder abnormality. Stomach/Bowel: Distal esophagus is unremarkable. Small air and fluid-filled gastric diverticula along the posterior aspect of the gastric fundus measuring up to 2.5 cm in size. Previously demonstrates  direct continuity on contrast-enhanced prior. Distal stomach and duodenum are unremarkable. No small bowel thickening or dilatation. No evidence of bowel obstruction. Prior right hemicolectomy with patent ileocolic anastomosis. A nodular density previously seen at the terminal ileum is less well visualized in the absence of enteric media though could correlate with recent colonoscopy. No colonic dilatation or wall thickening. Scattered colonic diverticula without focal pericolonic inflammation to suggest diverticulitis. Vascular/Lymphatic: No  significant vascular findings are present. No enlarged abdominal or pelvic lymph nodes. Reproductive: Uterus is surgically absent. No concerning adnexal lesions. Other: Complex ventral hernia is noted with a large fascial defect of approximately 8.5 by 4.5 cm transverse by craniocaudal dimension (2/46, 6/75). A smaller upper abdominal ventral hernia with a 2.9 by 1.8 cm fascial defect is noted as well (2/23, 6/75). Hernia sacs contain fat without significant inflammation within the hernia contents to suggest active strangulation. No bowel containing hernias. No abdominopelvic free air or fluid. Musculoskeletal: No acute osseous abnormality or suspicious osseous lesion. Stable mild retrolisthesis L5 on S1 without pars defects. Additional mild degenerative changes in the lower lumbar spine, hips and pelvis. IMPRESSION: 1. Evidence of prior right hemicolectomy with patent ileocolic anastomosis. A nodular density previously seen at the terminal ileum is less well visualized in the absence of enteric media though could correlate with recent colonoscopy. 2. Colonic diverticulosis without evidence of diverticulitis. Colonic diverticula may bleed in the absence of a inflammation. 3. Complex ventral hernia containing fat without significant inflammation within the hernia contents to suggest active strangulation. 4. Prior bilateral nephrectomy and right lower quadrant renal transplant placement. Some mild pelviectasis of the transplant kidney is slightly more pronounced than on prior though possibly related to bladder distension. Consider urinalysis to exclude infection or cystitis. 5. Hepatic steatosis. 6. Small air and fluid-filled gastric diverticula along the posterior aspect of the gastric fundus measuring up to 2.5 cm in size. 7. Aortic Atherosclerosis (ICD10-I70.0). Electronically Signed   By: Lovena Le M.D.   On: 06/26/2019 19:35   NM GI Blood Loss  Result Date: 06/27/2019 CLINICAL DATA:  History of ileal  carcinoid with GI bleeding for 2 days EXAM: NUCLEAR MEDICINE GASTROINTESTINAL BLEEDING SCAN TECHNIQUE: Sequential abdominal images were obtained following intravenous administration of Tc-73m labeled red blood cells. RADIOPHARMACEUTICALS:  23.8 mCi Tc-44m pertechnetate in-vitro labeled red cells. COMPARISON:  CT from the previous day. FINDINGS: There is adequate uptake of radioactive tracer throughout the vascular pool. Activity in the bladder is noted. Pooling in the spleen is seen as well. Diffuse mild increased activity is noted in the right lower quadrant in the expected region of prior ileocecal surgery. When compared with the prior CT examination this reflects blush within the right kidney which is pelvic in location consistent with prior transplant. No focal areas of increased activity are identified to suggest active extravasation. IMPRESSION: No evidence of acute GI hemorrhage. Electronically Signed   By: Inez Catalina M.D.   On: 06/27/2019 15:43      Subjective: No acute issues or events overnight, denies any further episodes of black stool, bright red blood per rectum, nausea, vomiting, diarrhea, constipation, headache, fevers, chills.   Discharge Exam: Vitals:   06/27/19 2003 06/28/19 0405  BP: 122/73 136/78  Pulse: 71 72  Resp: 17 17  Temp: 98.2 F (36.8 C) 98 F (36.7 C)  SpO2: 99% 96%   Vitals:   06/27/19 1005 06/27/19 1435 06/27/19 2003 06/28/19 0405  BP: 130/69 (!) 142/77 122/73 136/78  Pulse: 63 60 71 72  Resp: 17 17 17 17   Temp: 98 F (36.7 C) 98.2 F (36.8 C) 98.2 F (36.8 C) 98 F (36.7 C)  TempSrc: Oral Oral    SpO2: 98% 98% 99% 96%  Weight:      Height:        General: Pt is alert, awake, not in acute distress Cardiovascular: RRR, S1/S2 +, no rubs, no gallops Respiratory: CTA bilaterally, no wheezing, no rhonchi Abdominal: Soft, NT, ND, bowel sounds + Extremities: no edema, no cyanosis    The results of significant diagnostics from this hospitalization  (including imaging, microbiology, ancillary and laboratory) are listed below for reference.     Microbiology: Recent Results (from the past 240 hour(s))  SARS Coronavirus 2 by RT PCR (hospital order, performed in [facility] hospital lab) Nasopharyngeal Nasopharyngeal Swab     Status: None   Collection Time: 06/26/19  9:27 PM   Specimen: Nasopharyngeal Swab  Result Value Ref Range Status   SARS Coronavirus 2 NEGATIVE NEGATIVE Final    Comment: (NOTE) SARS-CoV-2 target nucleic acids are NOT DETECTED.  The SARS-CoV-2 RNA is generally detectable in upper and lower respiratory specimens during the acute phase of infection. The lowest concentration of SARS-CoV-2 viral copies this assay can detect is 250 copies / mL. A negative result does not preclude SARS-CoV-2 infection and should not be used as the sole basis for treatment or other patient management decisions.  A negative result may occur with improper specimen collection / handling, submission of specimen other than nasopharyngeal swab, presence of viral mutation(s) within the areas targeted by this assay, and inadequate number of viral copies (<250 copies / mL). A negative result must be combined with clinical observations, patient history, and epidemiological information.  Fact Sheet for Patients:   StrictlyIdeas.no  Fact Sheet for Healthcare Providers: BankingDealers.co.za  This test is not yet approved or  cleared by the [place] FDA and has been authorized for detection and/or diagnosis of SARS-CoV-2 by FDA under an Emergency Use Authorization (EUA).  This EUA will remain in effect (meaning this test can be used) for the duration of the COVID-19 declaration under Section 564(b)(1) of the Act, 21 U.S.C. section 360bbb-3(b)(1), unless the authorization is terminated or revoked sooner.  Performed at [facility], [address] [place], [place] [address]       Labs: BNP (last 3 results) No results for input(s): BNP in the last 8760 hours. Basic Metabolic Panel: Recent Labs  Lab 06/26/19 1745  NA 135  K 3.7  CL 104  CO2 21*  GLUCOSE 264*  BUN 19  CREATININE 1.26*  CALCIUM 8.8*   Liver Function Tests: Recent Labs  Lab 06/26/19 1745  AST 26  ALT 33  ALKPHOS 71  BILITOT 0.8  PROT 6.6  ALBUMIN 4.0   Recent Labs  Lab 06/26/19 1745  LIPASE 33   No results for input(s): AMMONIA in the last 168 hours. CBC: Recent Labs  Lab 06/26/19 1745 06/27/19 0249 06/27/19 0800 06/27/19 1429 06/27/19 2006  WBC 7.2  --   --   --   --   HGB 12.9 12.2 12.0 12.6 11.9*  HCT 39.7 37.6 36.8 38.2 36.5  MCV 89.6  --   --   --   --   PLT 215  --   --   --   --    Cardiac Enzymes: No results for input(s): CKTOTAL, CKMB, CKMBINDEX, TROPONINI in the last 168 hours. BNP: Invalid input(s): POCBNP CBG:  Recent Labs  Lab 06/27/19 1632 06/27/19 2006 06/28/19 0003 06/28/19 0402 06/28/19 0733  GLUCAP 130* 147* 136* 139* 160*   D-Dimer No results for input(s): DDIMER in the last 72 hours. Hgb A1c No results for input(s): HGBA1C in the last 72 hours. Lipid Profile No results for input(s): CHOL, HDL, LDLCALC, TRIG, CHOLHDL, LDLDIRECT in the last 72 hours. Thyroid function studies No results for input(s): TSH, T4TOTAL, T3FREE, THYROIDAB in the last 72 hours.  Invalid input(s): FREET3 Anemia work up No results for input(s): VITAMINB12, FOLATE, FERRITIN, TIBC, IRON, RETICCTPCT in the last 72 hours. Urinalysis    Component Value Date/Time   COLORURINE YELLOW 06/26/2019 1636   APPEARANCEUR CLEAR 06/26/2019 1636   LABSPEC >1.030 (H) 06/26/2019 1636   PHURINE 5.0 06/26/2019 1636   GLUCOSEU >=500 (A) 06/26/2019 1636   HGBUR NEGATIVE 06/26/2019 1636   BILIRUBINUR NEGATIVE 06/26/2019 1636   BILIRUBINUR negative 06/13/2018 0957   BILIRUBINUR N 06/07/2018 1053   KETONESUR NEGATIVE 06/26/2019 1636   PROTEINUR NEGATIVE 06/26/2019 1636    UROBILINOGEN 0.2 06/13/2018 0957   UROBILINOGEN 0.2 07/04/2011 2050   NITRITE NEGATIVE 06/26/2019 1636   LEUKOCYTESUR SMALL (A) 06/26/2019 1636   Sepsis Labs Invalid input(s): PROCALCITONIN,  WBC,  LACTICIDVEN Microbiology Recent Results (from the past 240 hour(s))  SARS Coronavirus 2 by RT PCR (hospital order, performed in [place] hospital lab) Nasopharyngeal Nasopharyngeal Swab     Status: None   Collection Time: 06/26/19  9:27 PM   Specimen: Nasopharyngeal Swab  Result Value Ref Range Status   SARS Coronavirus 2 NEGATIVE NEGATIVE Final    Comment: (NOTE) SARS-CoV-2 target nucleic acids are NOT DETECTED.  The SARS-CoV-2 RNA is generally detectable in upper and lower respiratory specimens during the acute phase of infection. The lowest concentration of SARS-CoV-2 viral copies this assay can detect is 250 copies / mL. A negative result does not preclude SARS-CoV-2 infection and should not be used as the sole basis for treatment or other patient management decisions.  A negative result may occur with improper specimen collection / handling, submission of specimen other than nasopharyngeal swab, presence of viral mutation(s) within the areas targeted by this assay, and inadequate number of viral copies (<250 copies / mL). A negative result must be combined with clinical observations, patient history, and epidemiological information.  Fact Sheet for Patients:   StrictlyIdeas.no  Fact Sheet for Healthcare Providers: BankingDealers.co.za  This test is not yet approved or  cleared by the [place] FDA and has been authorized for detection and/or diagnosis of SARS-CoV-2 by FDA under an Emergency Use Authorization (EUA).  This EUA will remain in effect (meaning this test can be used) for the duration of the COVID-19 declaration under Section 564(b)(1) of the Act, 21 U.S.C. section 360bbb-3(b)(1), unless the authorization is  terminated or revoked sooner.  Performed at [facility], [address] [place], [place] [address]      Time coordinating discharge: Over 30 minutes  SIGNED:   Little Ishikawa, DO [facility] 06/28/2019, 10:50 AM Pager   If 7PM-7AM, please contact night-coverage [email or web address]

## 2019-09-18 DIAGNOSIS — Z20828 Contact with and (suspected) exposure to other viral communicable diseases: Secondary | ICD-10-CM | POA: Diagnosis not present

## 2019-10-03 DIAGNOSIS — R739 Hyperglycemia, unspecified: Secondary | ICD-10-CM | POA: Diagnosis not present

## 2019-10-03 DIAGNOSIS — Z940 Kidney transplant status: Secondary | ICD-10-CM | POA: Diagnosis not present

## 2019-10-03 DIAGNOSIS — Z298 Encounter for other specified prophylactic measures: Secondary | ICD-10-CM | POA: Diagnosis not present

## 2019-10-03 DIAGNOSIS — M109 Gout, unspecified: Secondary | ICD-10-CM | POA: Diagnosis not present

## 2019-10-03 DIAGNOSIS — Z48298 Encounter for aftercare following other organ transplant: Secondary | ICD-10-CM | POA: Diagnosis not present

## 2019-10-07 DIAGNOSIS — Z23 Encounter for immunization: Secondary | ICD-10-CM | POA: Diagnosis not present

## 2019-10-07 DIAGNOSIS — R739 Hyperglycemia, unspecified: Secondary | ICD-10-CM | POA: Diagnosis not present

## 2019-10-07 DIAGNOSIS — I129 Hypertensive chronic kidney disease with stage 1 through stage 4 chronic kidney disease, or unspecified chronic kidney disease: Secondary | ICD-10-CM | POA: Diagnosis not present

## 2019-10-07 DIAGNOSIS — Z940 Kidney transplant status: Secondary | ICD-10-CM | POA: Diagnosis not present

## 2019-10-07 DIAGNOSIS — E7800 Pure hypercholesterolemia, unspecified: Secondary | ICD-10-CM | POA: Diagnosis not present

## 2019-10-15 DIAGNOSIS — Z940 Kidney transplant status: Secondary | ICD-10-CM | POA: Diagnosis not present

## 2019-11-05 DIAGNOSIS — E7800 Pure hypercholesterolemia, unspecified: Secondary | ICD-10-CM | POA: Diagnosis not present

## 2019-11-05 DIAGNOSIS — E049 Nontoxic goiter, unspecified: Secondary | ICD-10-CM | POA: Diagnosis not present

## 2019-11-05 DIAGNOSIS — E1165 Type 2 diabetes mellitus with hyperglycemia: Secondary | ICD-10-CM | POA: Diagnosis not present

## 2019-11-13 DIAGNOSIS — E1165 Type 2 diabetes mellitus with hyperglycemia: Secondary | ICD-10-CM | POA: Diagnosis not present

## 2019-11-13 DIAGNOSIS — I10 Essential (primary) hypertension: Secondary | ICD-10-CM | POA: Diagnosis not present

## 2019-11-13 DIAGNOSIS — E049 Nontoxic goiter, unspecified: Secondary | ICD-10-CM | POA: Diagnosis not present

## 2019-11-14 ENCOUNTER — Other Ambulatory Visit: Payer: Self-pay

## 2019-11-14 ENCOUNTER — Encounter: Payer: Self-pay | Admitting: Family Medicine

## 2019-11-14 ENCOUNTER — Ambulatory Visit (INDEPENDENT_AMBULATORY_CARE_PROVIDER_SITE_OTHER): Payer: BC Managed Care – PPO | Admitting: Family Medicine

## 2019-11-14 VITALS — BP 146/90 | HR 75 | Temp 98.0°F | Ht 65.5 in | Wt 292.0 lb

## 2019-11-14 DIAGNOSIS — B372 Candidiasis of skin and nail: Secondary | ICD-10-CM

## 2019-11-14 MED ORDER — NYSTATIN 100000 UNIT/GM EX POWD
1.0000 | Freq: Three times a day (TID) | CUTANEOUS | 0 refills | Status: DC
Start: 2019-11-14 — End: 2020-07-28

## 2019-11-14 NOTE — Patient Instructions (Addendum)
1) get over the counter clotrimazole cream and use this twice a day.  2) nystatin powder to help keep area dry.  3) keeping it dry and cool is key.  4) if not getting better let me know as you can get superimposed bacterial infection 5) if really bothering you can use a barrier like zinc oxide.  6) if not cleared up by topical let me know so I can send in oral medication called diflucan.   Intertrigo Intertrigo is skin irritation (inflammation) that happens in warm, moist areas of the body. The irritation can cause a rash and make skin raw and itchy. The rash is usually pink or red. It happens mostly between folds of skin or where skin rubs together, such as:  Between the toes.  In the armpits.  In the groin area.  Under the belly.  Under the breasts.  Around the butt area. This condition is not passed from person to person (is not contagious). What are the causes?  Heat, moisture, rubbing, and not enough air movement.  The condition can be made worse by: ? Sweat. ? Bacteria. ? A fungus, such as yeast. What increases the risk?  Moisture in your skin folds.  You are more likely to develop this condition if you: ? Have diabetes. ? Are overweight. ? Are not able to move around. ? Live in a warm and moist climate. ? Wear splints, braces, or other medical devices. ? Are not able to control your pee (urine) or poop (stool). What are the signs or symptoms?  A pink or red skin rash in the skin fold or near the skin fold.  Raw or scaly skin.  Itching.  A burning feeling.  Bleeding.  Leaking fluid.  A bad smell. How is this treated?  Cleaning and drying your skin.  Taking an antibiotic medicine or using an antibiotic skin cream for a bacterial infection.  Using an antifungal cream on your skin or taking pills for an infection that was caused by a fungus, such as yeast.  Using a steroid ointment to stop the itching and irritation.  Separating the skin fold with  a clean cotton cloth to absorb moisture and allow air to flow into the area. Follow these instructions at home:  Keep the affected area clean and dry.  Do not scratch your skin.  Stay cool as much as you can. Use an air conditioner or a fan, if you have one.  Apply over-the-counter and prescription medicines only as told by your doctor.  If you were prescribed an antibiotic medicine, use it as told by your doctor. Do not stop using the antibiotic even if your condition starts to get better.  Keep all follow-up visits as told by your doctor. This is important. How is this prevented?   Stay at a healthy weight.  Take care of your feet. This is very important if you have diabetes. You should: ? Wear shoes that fit well. ? Keep your feet dry. ? Wear clean cotton or wool socks.  Protect the skin in your groin and butt area as told by your doctor. To do this: ? Follow a regular cleaning routine. ? Use creams, powders, or ointments that protect your skin. ? Change protection pads often.  Do not wear tight clothes. Wear clothes that: ? Are loose. ? Take moisture away from your body. ? Are made of cotton.  Wear a bra that gives good support, if needed.  Shower and dry yourself well after  being active. Use a hair dryer on a cool setting to dry between skin folds.  Keep your blood sugar under control if you have diabetes. Contact a doctor if:  Your symptoms do not get better with treatment.  Your symptoms get worse or they spread.  You notice more redness and warmth.  You have a fever. Summary  Intertrigo is skin irritation that occurs when folds of skin rub together.  This condition is caused by heat, moisture, and rubbing.  This condition may be treated by cleaning and drying your skin and with medicines.  Apply over-the-counter and prescription medicines only as told by your doctor.  Keep all follow-up visits as told by your doctor. This is important. This  information is not intended to replace advice given to you by your health care provider. Make sure you discuss any questions you have with your health care provider. Document Revised: 10/05/2017 Document Reviewed: 10/05/2017 Elsevier Patient Education  2020 [employer].

## 2019-11-14 NOTE — Progress Notes (Signed)
Patient: [name] [name] MRN: [id number] DOB: 16-Feb-1965 PCP: Orma Flaming, MD     Subjective:  Chief Complaint  Patient presents with  . Rash    under left axilla , x 1 week, tried Hydrocortisone cream no relief    HPI: The patient is a 54 y.o. female who presents today for rash under her left axilla x 1 week. She states it hurts and it itches. She has a past history of eczema in the past. She has put hydrocortisone on it and it has not helped. It is red, raised.   Review of Systems  Constitutional: Negative for chills and fever.  Respiratory: Negative for cough and shortness of breath.   Skin: Positive for rash.    Allergies Patient is allergic to tape and vancomycin.  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  Surgical History Patient  has a past surgical history that includes Cesarean section (1995 and 1997); Abdominal hysterectomy (2000?); Cataract extraction (2011); Peritoneal catheter insertion; Ventral hernia repair (07/04/2011); Hernia repair (2005); Hernia repair (2013); Nephrectomy transplanted organ; Colonoscopy (N/A, 12/05/2017); and biopsy (12/05/2017).  Family History Pateint's family history includes Arthritis in her father, mother, and paternal grandmother; Cancer in her maternal grandfather, paternal grandfather, and paternal grandmother; Cancer (age of onset: 30) in her sister; Cerebral aneurysm in her father; Hearing loss in her paternal grandfather; Hypertension in her brother and father; Kidney disease in her brother; Polycystic kidney disease in her brother and father; Stroke in her father.  Social History Patient  reports that she has never smoked. She has never used  smokeless tobacco. She reports that she does not drink alcohol and does not use drugs.    Objective: Vitals:   11/14/19 1306  BP: (!) 146/90  Pulse: 75  Temp: 98 F (36.7 C)  TempSrc: Temporal  SpO2: 98%  Weight: 292 lb (132.5 kg)  Height: 5' 5.5" (1.664 m)    Body mass index is 47.85 kg/m.  Physical Exam Vitals reviewed.  Constitutional:      Appearance: Normal appearance. She is obese.  HENT:     Head: Normocephalic and atraumatic.  Skin:    Findings: Rash present.     Comments: Erythematous plaque like rash under left axillae with satellite lesions.   Neurological:     Mental Status: She is alert.        Assessment/plan: 1. Candidal intertrigo -discussed hygiene including keeping it cool and dry.  -working on getting a1c better and weight down -start clotrimazole BID -nystatin powder as needed -if not clearing up will do diflucan, but want to see if clears up with topical treatment as diflucan can interact with her prograf.  -precautions given for superimposed bacterial infection, but none present at this time.  -zinc oxide for barrier if needed.    This visit occurred during the SARS-CoV-2 public health emergency.  Safety protocols were in place, including screening questions prior to the visit, additional usage of staff PPE, and extensive cleaning of exam room while observing appropriate contact time as indicated for disinfecting solutions.     Return if symptoms worsen or fail to improve.     Orma Flaming, MD [place]  11/14/2019

## 2019-11-27 ENCOUNTER — Encounter: Payer: Self-pay | Admitting: Family Medicine

## 2019-11-27 MED ORDER — FLUCONAZOLE 150 MG PO TABS: 150.0000 mg | ORAL_TABLET | ORAL | 0 refills | Status: DC

## 2019-11-29 DIAGNOSIS — Z940 Kidney transplant status: Secondary | ICD-10-CM | POA: Diagnosis not present

## 2019-11-29 DIAGNOSIS — Z298 Encounter for other specified prophylactic measures: Secondary | ICD-10-CM | POA: Diagnosis not present

## 2019-11-29 DIAGNOSIS — Z48298 Encounter for aftercare following other organ transplant: Secondary | ICD-10-CM | POA: Diagnosis not present

## 2019-12-27 ENCOUNTER — Other Ambulatory Visit: Payer: Self-pay

## 2019-12-27 ENCOUNTER — Telehealth: Payer: Self-pay

## 2019-12-27 MED ORDER — INSULIN ASPART PROT & ASPART (70-30 MIX) 100 UNIT/ML ~~LOC~~ SUSP: 25.0000 [IU] | SUBCUTANEOUS | 1 refills | Status: DC

## 2019-12-27 NOTE — Telephone Encounter (Signed)
I can send this in for her. Is she on the novolog mix 70/30 25 units daily?  Thanks! aw

## 2019-12-27 NOTE — Addendum Note (Signed)
Addended by: Orma Flaming on: 12/27/2019 04:07 PM   Modules accepted: Orders

## 2019-12-27 NOTE — Telephone Encounter (Signed)
25 units in the morning and 30 after dinner.

## 2019-12-27 NOTE — Telephone Encounter (Signed)
Patient called in stating she sent a refill request for her insulin to her Endocrinologist and they have yet to get back with her, upon calling the office today she realized they are closed and she is completely out for 3 days. [name] [name] states that she is desperately needing her insulin as she is already having a bad headache, and her sugar is high.

## 2020-01-15 ENCOUNTER — Other Ambulatory Visit: Payer: Self-pay | Admitting: Family Medicine

## 2020-04-14 IMAGING — CT CT ABD-PELV W/ CM
1 of 3 series · 13 of 32 positions shown, 18 images · IV contrast (iopamidol)
Comparison: 09/12/2017 CT abdomen/pelvis.

CLINICAL DATA: Recent diagnosis of carcinoid of the terminal ileum
identified 12/05/2017 colonoscopic biopsy. History of bilateral
nephrectomy and right renal transplant in 0719.

EXAM:
CT ABDOMEN AND PELVIS WITH CONTRAST
TECHNIQUE: Multidetector CT imaging of the abdomen and pelvis was performed
using the standard protocol following bolus administration of
intravenous contrast.
CONTRAST:  125mL [id number] IOPAMIDOL ([id number]) INJECTION 61%

[Series 2: abd/pelvis w/cm · axial · 0.98mm/px · z∈[-535,-65]mm · 13 of 108 slices shown, 18 images]
[im 7/108  soft-tissue]
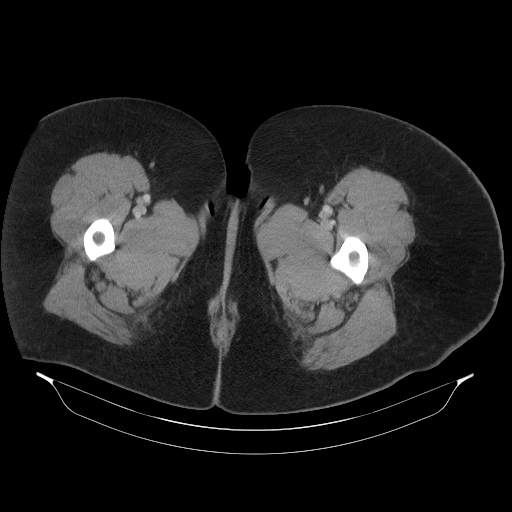
[im 7/108  bone]
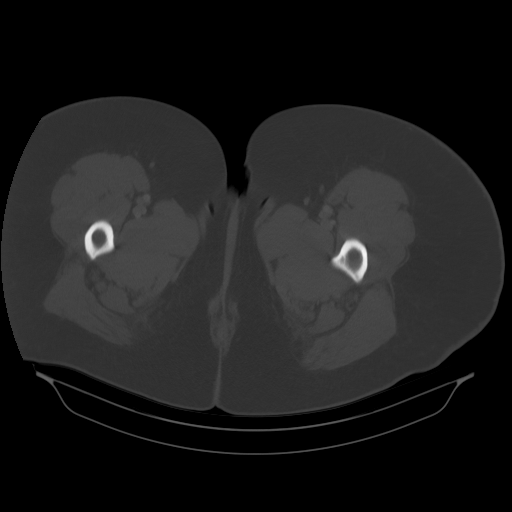
[im 14/108  soft-tissue]
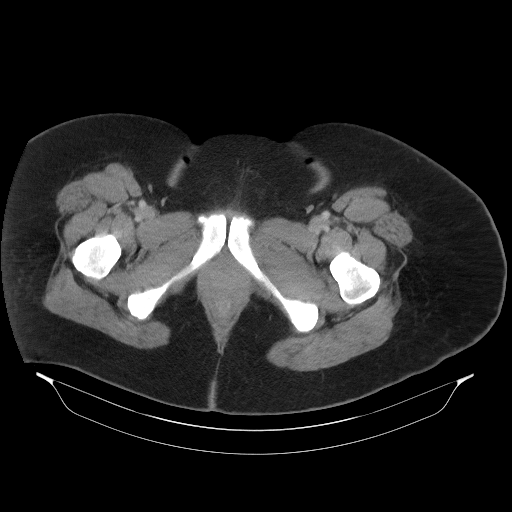
[im 27/108  soft-tissue]
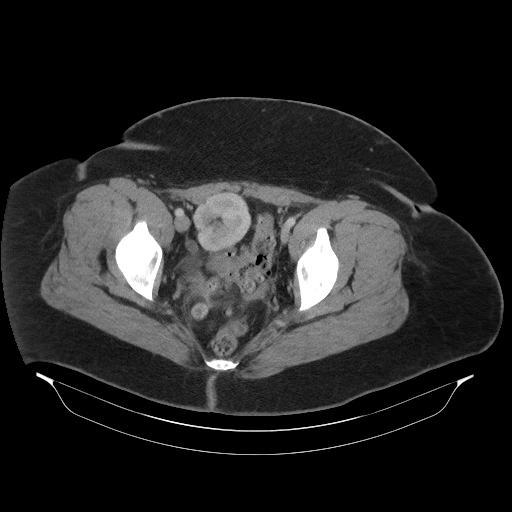
[im 34/108  soft-tissue]
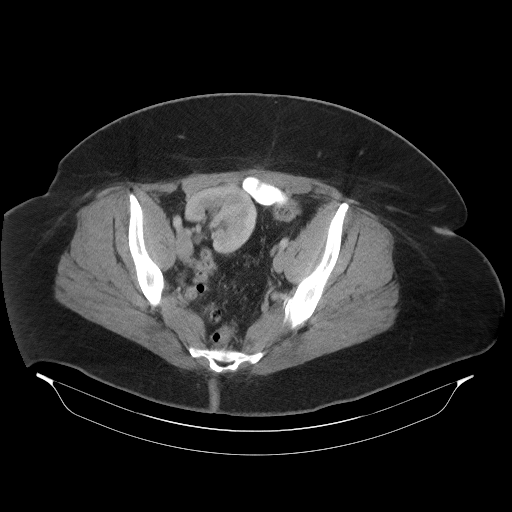
[im 41/108  soft-tissue]
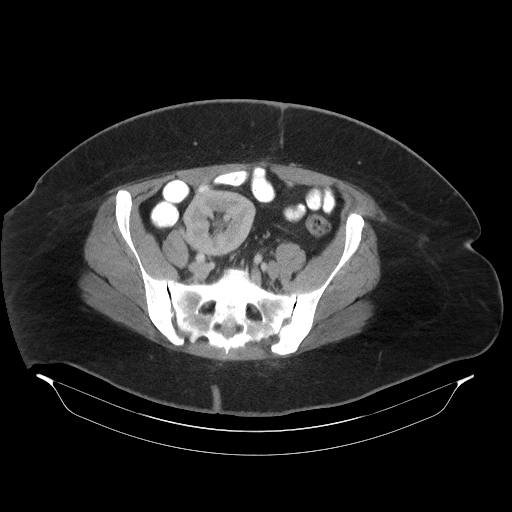
[im 47/108  soft-tissue]
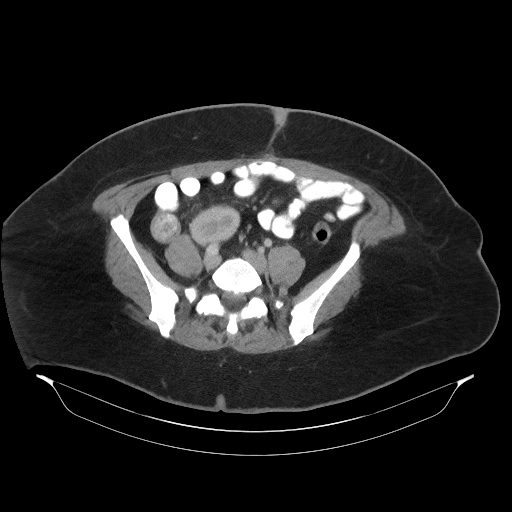
[im 61/108  soft-tissue]
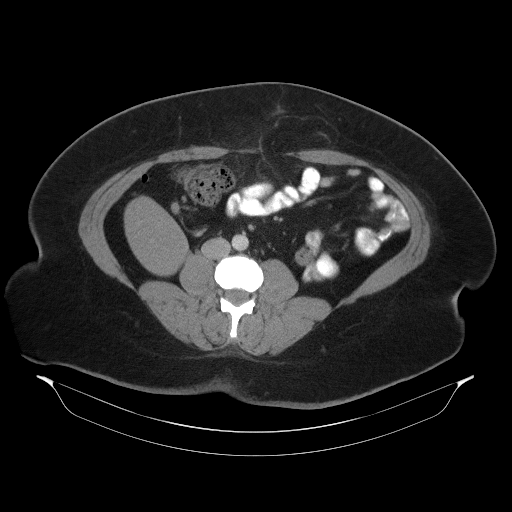
[im 67/108  soft-tissue]
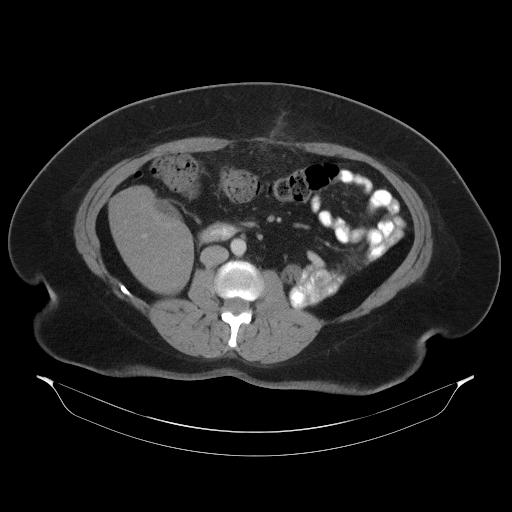
[im 74/108  soft-tissue]
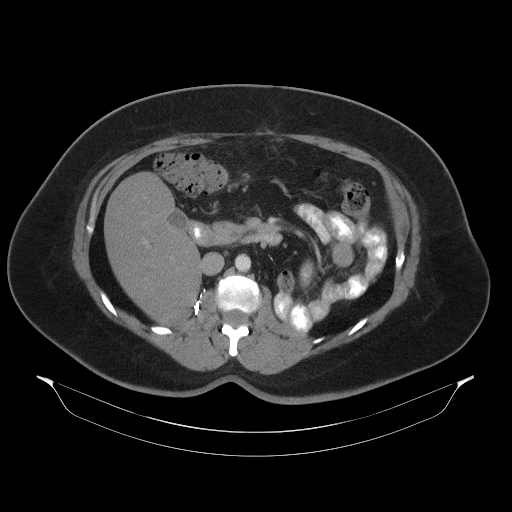
[im 74/108  bone]
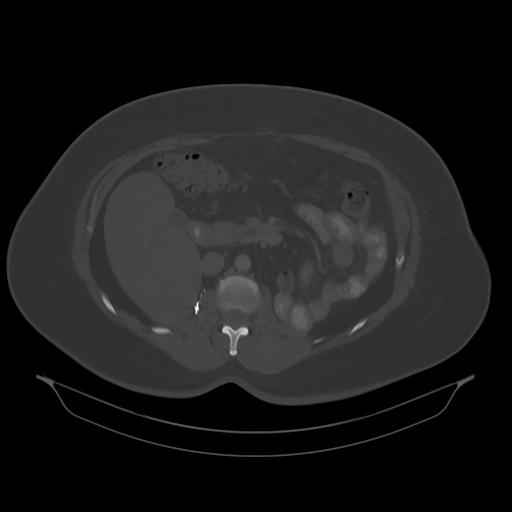
[im 81/108  soft-tissue]
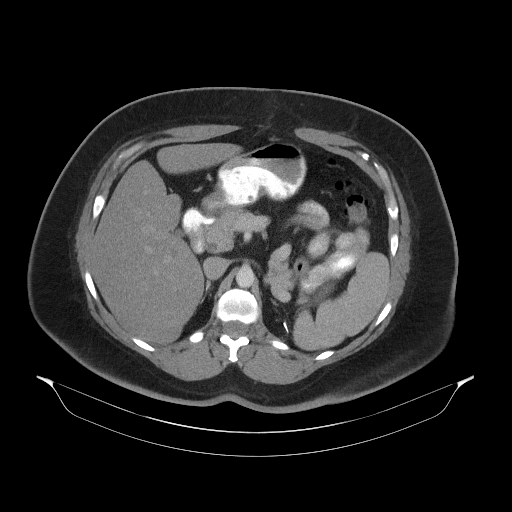
[im 81/108  lung]
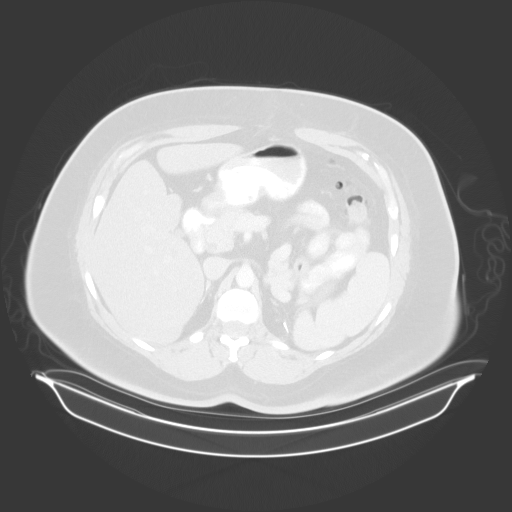
[im 87/108  lung]
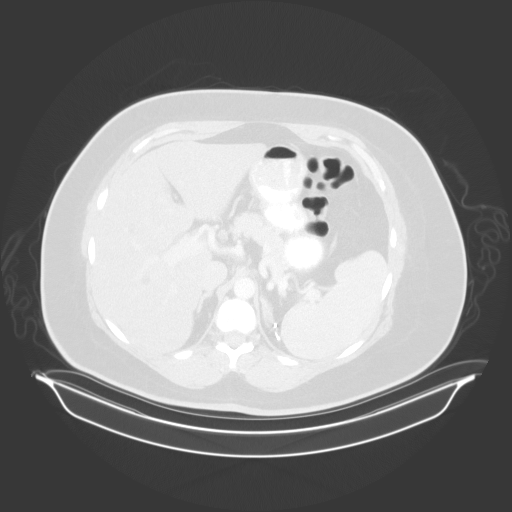
[im 94/108  soft-tissue]
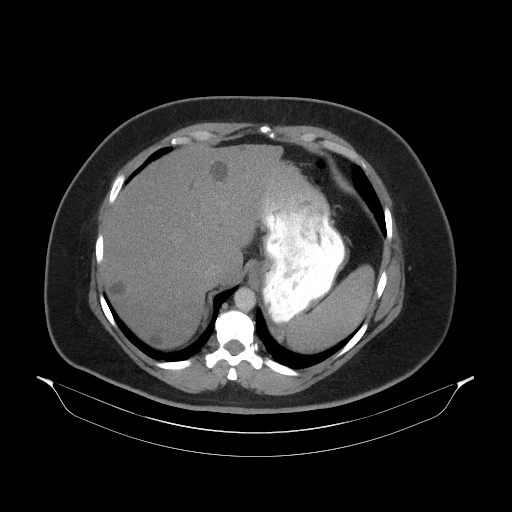
[im 94/108  lung]
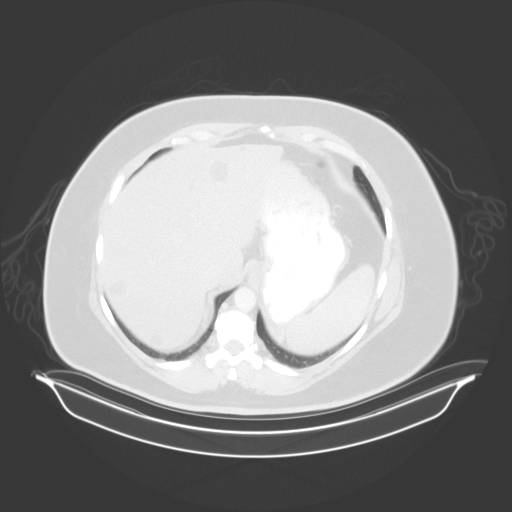
[im 101/108  soft-tissue]
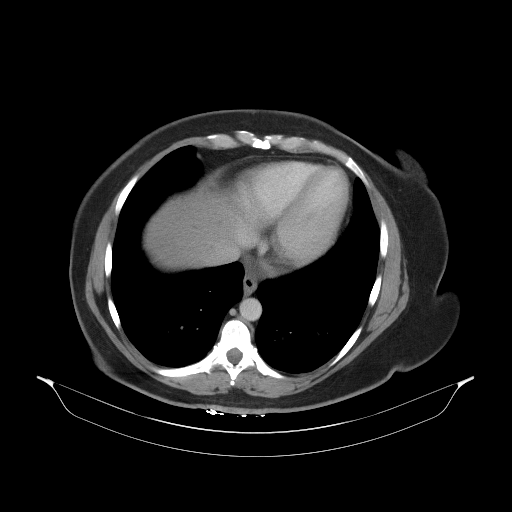
[im 101/108  lung]
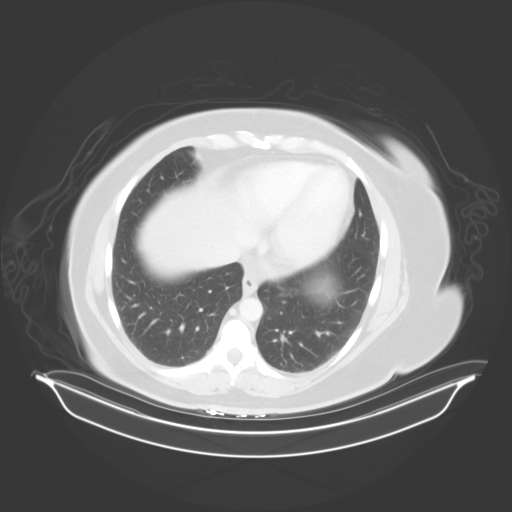

[13 of 32 positions shown; findings below may reference images not displayed]

FINDINGS: Lower chest: No significant pulmonary nodules or acute consolidative
airspace disease.

Hepatobiliary: Normal liver size with Keng U configuration.
Several simple cysts are scattered throughout the liver, largest
cm in the left liver lobe. Numerous subcentimeter hypodense lesions
scattered throughout the liver are too small to characterize and are
not appreciably changed since 09/12/2017 CT. Normal gallbladder with
no radiopaque cholelithiasis. No biliary ductal dilatation.

Pancreas: Normal, with no mass or duct dilation.

Spleen: Normal size. No mass.

Adrenals/Urinary Tract: Normal adrenals. Status post nephrectomy of
the bilateral native kidneys, with no mass or fluid collection in
the nephrectomy beds. Right lower quadrant renal transplant appears
normal with no hydronephrosis, mass or perinephric collection.
Normal bladder.

Stomach/Bowel: Normal non-distended stomach. Normal caliber small
bowel. Oral contrast transits to the distal ileum. Surgical clip is
noted in the terminal ileum at the ileocecal valve. There is an
adjacent 1.3 cm nodular focus of wall thickening in the terminal
ileum (series 2/image 52). Otherwise no small bowel wall thickening.
Normal appendix. Moderate colonic diverticulosis, most prominent in
the sigmoid colon, with no large bowel wall thickening or acute
pericolonic fat stranding.

Vascular/Lymphatic: Normal caliber abdominal aorta. Patent portal,
splenic and hepatic veins. Patent transplant kidney vasculature. A
few clustered mildly prominent rounded lymph nodes in the ileocolic
mesentery, largest 0.9 cm (series 2/image 40). Otherwise no
pathologically enlarged abdominopelvic nodes.

Reproductive: Status post hysterectomy, with no abnormal findings at
the vaginal cuff. No adnexal mass.

Other: No pneumoperitoneum, ascites or focal fluid collection.
Moderate bilateral superior periumbilical fat containing hernias.

Musculoskeletal: No aggressive appearing focal osseous lesions. Mild
lower thoracic spondylosis.
IMPRESSION: 1. Nodular 1.3 cm focus of wall thickening in the terminal ileum
adjacent to a surgical clip, which may represent the primary ileal
carcinoid.
2. Few clustered mildly prominent rounded ileocolic mesenteric lymph
nodes, equivocal for local nodal metastases.
3. Several subcentimeter hypodense lesions scattered throughout the
liver, too small to characterize, not appreciably changed since
09/12/2017 noncontrast CT abdomen study. These lesions are
considered indeterminate and a follow-up MRI abdomen without and
with IV contrast is suggested in 3-6 months. Alternatively, a
Ga68-DOTATATE PET-CT could be obtained for further staging
evaluation, as clinically warranted.
4. Moderate colonic diverticulosis.

## 2020-06-17 ENCOUNTER — Encounter: Payer: BC Managed Care – PPO | Admitting: Family Medicine

## 2020-07-28 ENCOUNTER — Ambulatory Visit (INDEPENDENT_AMBULATORY_CARE_PROVIDER_SITE_OTHER): Payer: Commercial Managed Care - PPO | Admitting: Physician Assistant

## 2020-07-28 ENCOUNTER — Encounter: Payer: Self-pay | Admitting: Physician Assistant

## 2020-07-28 ENCOUNTER — Other Ambulatory Visit: Payer: Self-pay

## 2020-07-28 VITALS — BP 134/88 | HR 79 | Temp 98.6°F | Ht 65.5 in | Wt 290.2 lb

## 2020-07-28 DIAGNOSIS — Z23 Encounter for immunization: Secondary | ICD-10-CM | POA: Diagnosis not present

## 2020-07-28 DIAGNOSIS — Z0000 Encounter for general adult medical examination without abnormal findings: Secondary | ICD-10-CM | POA: Diagnosis not present

## 2020-07-28 DIAGNOSIS — E1169 Type 2 diabetes mellitus with other specified complication: Secondary | ICD-10-CM | POA: Diagnosis not present

## 2020-07-28 DIAGNOSIS — E785 Hyperlipidemia, unspecified: Secondary | ICD-10-CM

## 2020-07-28 DIAGNOSIS — E119 Type 2 diabetes mellitus without complications: Secondary | ICD-10-CM | POA: Diagnosis not present

## 2020-07-28 DIAGNOSIS — Z794 Long term (current) use of insulin: Secondary | ICD-10-CM

## 2020-07-28 NOTE — Patient Instructions (Addendum)
Call to schedule your mammogram, please.  Referral sent for diabetic eye specialist for screening exam. Tdap vaccine given in office today. You are doing great!   Call if any concerns!

## 2020-07-28 NOTE — Progress Notes (Signed)
Established Patient Office Visit  Subjective:  Patient ID: [name] [name], female    DOB: [birth year]  Age: 55 y.o. MRN: [id number]  CC:  Chief Complaint  Patient presents with   Annual Exam    HPI [name] [name] presents for annual CPE. Hx of kidney transplant (Sept 4, 2014) - polycystic kidney disease. Neuroendocrine neoplasm of GI tract - sees [facility]ology.   Acute concerns: -Skin lesion right lower leg that came up kind of suddenly, reddish in color. -Arthritis in her spine and right knee (3-4 years). Limited in what she can take because of her kidney transplant.  -7.4 hemoglobin A1c last month, sees Dr. Chalmers Cater -[id number], Dr. Clover Mealy - nephrologist in [place]; takes her Lipitor 40 mg daily  Health maintenance: Lifestyle/ exercise: She has been walking about 1 mile / day with puppy  Nutrition: Water, occ lemonade; cooks mostly at home, sometimes too many carbs Mental health: None  Caffeine: None  Sleep: Sometimes interrupted sleep  Substance use: None Sexual activity: Married, monogamous Immunizations: Needs Tdap updated Colonoscopy: Completed last year (2021) Pap: Hx of hysterectomy Mammogram: Needs to schedule this Fall    Past Medical History:  Diagnosis Date   Arthritis    Chronic kidney disease    Diabetes mellitus without complication ([place])    Eczema    Enlarged kidney    bilateral   Gout    previously   History of chicken pox    History of recurrent UTIs    Hyperlipidemia    reports doesnt have elevated lipids, but takes due to hx of kidney transplant    Hypertension    reports BP normalized after transplant   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    Polycystic kidney disease    Psoriasis     Past Surgical History:  Procedure Laterality Date   ABDOMINAL HYSTERECTOMY  2000?   complete   BIOPSY  12/05/2017   Procedure: BIOPSY;  Surgeon: Ronnette Juniper, MD;  Location: [facility];  Service:  Gastroenterology;;   CATARACT EXTRACTION  2011   bilateral   [place] N/A 12/05/2017   Procedure: COLONOSCOPY;  Surgeon: Ronnette Juniper, MD;  Location: [facility];  Service: Gastroenterology;  Laterality: N/A;   HERNIA REPAIR  6962   umbilical hernia   HERNIA REPAIR  2013   NEPHRECTOMY TRANSPLANTED ORGAN     PERITONEAL CATHETER INSERTION     VENTRAL HERNIA REPAIR  07/04/2011   Procedure: HERNIA REPAIR VENTRAL ADULT;  Surgeon: Imogene Burn. Georgette Dover, MD;  Location: [facility];  Service: General;  Laterality: N/A;    Family History  Problem Relation Age of Onset   Arthritis Mother    Hypertension Father    Polycystic kidney disease Father        died before starting dialysis was near ESRD   Cerebral aneurysm Father        died age 35   Stroke Father    Arthritis Father    Cancer Sister 61       cancer that developed in the fatty tissues of the muscle   Kidney disease Brother    Hypertension Brother    Polycystic kidney disease Brother        transplant  7 years ago   Cancer Maternal Grandfather    Cancer Paternal Grandmother    Arthritis Paternal Grandmother    Cancer Paternal Grandfather    Hearing loss Paternal Merchant navy officer  Social History   Socioeconomic History   Marital status: Married    Spouse name: Juanda Crumble   Number of children: 2   Years of education: Not on file   Highest education level: Not on file  Occupational History    Employer: [employer]    Comment: Medical illustrator  Tobacco Use   Smoking status: Never   Smokeless tobacco: Never  Vaping Use   Vaping Use: Never used  Substance and Sexual Activity   Alcohol use: No   Drug use: No   Sexual activity: Yes    Partners: Male  Other Topics Concern   Not on file  Social History Narrative   Not on file   Social Determinants of Health   Financial Resource Strain: Not on file  Food Insecurity: Not on file  Transportation Needs: Not on file  Physical Activity:  Not on file  Stress: Not on file  Social Connections: Not on file  Intimate Partner Violence: Not on file    Outpatient Medications Prior to Visit  Medication Sig Dispense Refill   acetaminophen (TYLENOL) 325 MG tablet Take 650 mg by mouth daily as needed for mild pain or headache.      aspirin 81 MG EC tablet Take 81 mg by mouth daily.      Cholecalciferol (VITAMIN D3) 50 MCG (2000 UT) TABS Take 2,000 Units by mouth in the morning, at noon, and at bedtime.     CONTOUR NEXT TEST test strip 3 (three) times daily.      insulin aspart protamine- aspart (NOVOLOG MIX 70/30) (70-30) 100 UNIT/ML injection Inject 0.25 mLs (25 Units total) into the skin See admin instructions. Inject 25 units into the skin in the morning and 30 units after supper 10 mL 1   MYFORTIC 360 MG TBEC EC tablet Take 360 mg by mouth 2 (two) times daily.     PROGRAF 1 MG capsule Take 1 mg by mouth 2 (two) times daily.     psyllium (HYDROCIL/METAMUCIL) 95 % PACK Take 1 packet by mouth 2 (two) times daily. 240 each 0   sodium bicarbonate 650 MG tablet Take 650 mg by mouth 2 (two) times daily.     nystatin (MYCOSTATIN/NYSTOP) powder Apply 1 application topically 3 (three) times daily. 15 g 0   atorvastatin (LIPITOR) 40 MG tablet Take 40 mg by mouth at bedtime.      Insulin Pen Needle (B-D UF III MINI PEN NEEDLES) 31G X 5 MM MISC USE AS DIRECTED ONCE DAILY     fluconazole (DIFLUCAN) 150 MG tablet Take 1 tablet (150 mg total) by mouth once a week. For 4 weeks 4 tablet 0   No facility-administered medications prior to visit.    Allergies  Allergen Reactions   Tape Rash and Other (See Comments)    Tears the skin- please use an alternative   Vancomycin Hives, Itching, Other (See Comments) and Rash    Received via IV    ROS Review of Systems  Constitutional:  Negative for activity change, appetite change, fever and unexpected weight change.  HENT:  Negative for congestion.   Eyes:  Negative for visual disturbance.   Respiratory:  Negative for apnea, cough and shortness of breath.   Cardiovascular:  Negative for chest pain, palpitations and leg swelling.  Gastrointestinal:  Negative for abdominal pain, blood in stool, constipation and diarrhea.  Endocrine: Negative for polydipsia, polyphagia and polyuria.  Genitourinary:  Negative for dysuria and pelvic pain.  Musculoskeletal:  Positive for arthralgias  and back pain.  Skin:  Negative for rash.  Neurological:  Negative for dizziness, weakness and headaches.  Hematological:  Negative for adenopathy. Does not bruise/bleed easily.  Psychiatric/Behavioral:  Negative for sleep disturbance and suicidal ideas. The patient is not nervous/anxious.      Objective:    Physical Exam Vitals reviewed.  Constitutional:      Appearance: Normal appearance. She is well-developed. She is obese.  HENT:     Head: Normocephalic and atraumatic.     Right Ear: Tympanic membrane, ear canal and external ear normal.     Left Ear: Tympanic membrane, ear canal and external ear normal.  Eyes:     Conjunctiva/sclera: Conjunctivae normal.     Pupils: Pupils are equal, round, and reactive to light.  Neck:     Thyroid: No thyromegaly.  Cardiovascular:     Rate and Rhythm: Normal rate and regular rhythm.     Pulses: Normal pulses.     Heart sounds: Normal heart sounds. No murmur heard. Pulmonary:     Effort: Pulmonary effort is normal.     Breath sounds: Normal breath sounds.  Abdominal:     General: Bowel sounds are normal. There is no distension.     Palpations: Abdomen is soft.     Tenderness: There is no abdominal tenderness.  Musculoskeletal:     Cervical back: Normal range of motion and neck supple.  Lymphadenopathy:     Cervical: No cervical adenopathy.  Skin:    General: Skin is warm and dry.     Capillary Refill: Capillary refill takes less than 2 seconds.     Findings: No rash.     Comments: Medial RLE <2 mm slightly raised dry pinkish brown plaque, similar  in appearance to seborrheic keratosis  Neurological:     General: No focal deficit present.     Mental Status: She is alert and oriented to person, place, and time.     Cranial Nerves: No cranial nerve deficit.     Coordination: Coordination normal.     Deep Tendon Reflexes: Reflexes normal.  Psychiatric:        Mood and Affect: Mood normal.        Behavior: Behavior normal.    BP 134/88   Pulse 79   Temp 98.6 F (37 C)   Ht 5' 5.5" (1.664 m)   Wt 290 lb 3.2 oz (131.6 kg)   LMP  (LMP Unknown)   SpO2 96%   BMI 47.56 kg/m  Wt Readings from Last 3 Encounters:  07/28/20 290 lb 3.2 oz (131.6 kg)  11/14/19 292 lb (132.5 kg)  06/27/19 288 lb 5.8 oz (130.8 kg)     Health Maintenance Due  Topic Date Due   FOOT EXAM  Never done   OPHTHALMOLOGY EXAM  Never done   Hepatitis C Screening  Never done   Zoster Vaccines- Shingrix (1 of 2) Never done   COLONOSCOPY (Pts 45-63yrs Insurance coverage will need to be confirmed)  12/06/2018   HEMOGLOBIN A1C  12/14/2019   URINE MICROALBUMIN  06/13/2020    There are no preventive care reminders to display for this patient.  Lab Results  Component Value Date   TSH 2.17 06/14/2019   Lab Results  Component Value Date   WBC 7.2 06/26/2019   HGB 11.9 (L) 06/27/2019   HCT 36.5 06/27/2019   MCV 89.6 06/26/2019   PLT 215 06/26/2019   Lab Results  Component Value Date   NA 135 06/26/2019  K 3.7 06/26/2019   CO2 21 (L) 06/26/2019   GLUCOSE 264 (H) 06/26/2019   BUN 19 06/26/2019   CREATININE 1.26 (H) 06/26/2019   BILITOT 0.8 06/26/2019   ALKPHOS 71 06/26/2019   AST 26 06/26/2019   ALT 33 06/26/2019   PROT 6.6 06/26/2019   ALBUMIN 4.0 06/26/2019   CALCIUM 8.8 (L) 06/26/2019   ANIONGAP 10 06/26/2019   GFR 48.21 (L) 06/14/2019   Lab Results  Component Value Date   CHOL 126 06/14/2019   Lab Results  Component Value Date   HDL 47.60 06/14/2019   Lab Results  Component Value Date   LDLCALC 66 06/14/2019   Lab Results   Component Value Date   TRIG 62.0 06/14/2019   Lab Results  Component Value Date   CHOLHDL 3 06/14/2019   Lab Results  Component Value Date   HGBA1C 8.6 (H) 06/14/2019      Assessment & Plan:   Problem List Items Addressed This Visit       Endocrine   Diabetes mellitus type 2, insulin dependent ([place])   Relevant Orders   Ambulatory referral to Ophthalmology   Other Visit Diagnoses     Encounter for annual physical exam    -  Primary   Relevant Orders   Tdap vaccine greater than or equal to 7yo IM (Completed)   Need for immunization against tetanus alone       Relevant Orders   Tdap vaccine greater than or equal to 7yo IM (Completed)       No orders of the defined types were placed in this encounter.   Follow-up: Return in about 1 year (around 07/28/2021) for CPE .    1. Encounter for annual physical exam 2. Need for immunization against tetanus alone 3. Diabetes mellitus type 2, insulin dependent ([place]) 4. Hyperlipidemia associated with type 2 diabetes mellitus ([place])  Age-appropriate screening and counseling performed today.  She has labs checked every month with her specialist.  Encouraged her to set up her mammogram this year.  Her tetanus was updated in our office today.  She is up-to-date with all other screening measures at this time.  She will continue regular follow-up with her specialists.  She will continue to try to walk and exercise as tolerated.  She will continue to try to have a well-balanced, high-fiber low-fat diet.  I also set up a referral for her to see an ophthalmologist who specializes in diabetic patients.   Amberlee Garvey M Marycatherine Maniscalco, PA-C

## 2020-08-28 ENCOUNTER — Telehealth (INDEPENDENT_AMBULATORY_CARE_PROVIDER_SITE_OTHER): Payer: Commercial Managed Care - PPO | Admitting: Physician Assistant

## 2020-08-28 ENCOUNTER — Encounter: Payer: Self-pay | Admitting: Physician Assistant

## 2020-08-28 VITALS — Ht 65.5 in

## 2020-08-28 DIAGNOSIS — Z20822 Contact with and (suspected) exposure to covid-19: Secondary | ICD-10-CM | POA: Diagnosis not present

## 2020-08-28 MED ORDER — MOLNUPIRAVIR EUA 200MG CAPSULE: 4.0000 | ORAL_CAPSULE | Freq: Two times a day (BID) | ORAL | 0 refills | Status: AC

## 2020-08-28 MED ORDER — BENZONATATE 100 MG PO CAPS: 100.0000 mg | ORAL_CAPSULE | Freq: Three times a day (TID) | ORAL | 0 refills | Status: DC | PRN

## 2020-08-28 NOTE — Progress Notes (Signed)
Virtual Visit via Video Note  I connected with  Despina Hidden  on 08/28/20 at  8:30 AM EDT by a video enabled telemedicine application and verified that I am speaking with the correct person using two identifiers.  Location: Patient: home, [place] Provider: Therapist, music at [place] present: Patient and myself   I discussed the limitations of evaluation and management by telemedicine and the availability of in person appointments. The patient expressed understanding and agreed to proceed.   History of Present Illness: Chief complaint: COVID symptoms and exposure Symptom onset: late evening 08/27/20 Pertinent positives: ST, cough, hoarse, HA, nasal congestion Pertinent negatives: fever, chills, SOB, chest pain Treatments tried: Tylenol  Vaccine status: Pfizer x 2 and boosters x 2 Sick exposure: Husband tested positive for COVID yesterday    Observations/Objective:  Gen: Awake, alert, no acute distress, hoarse sounding Resp: Breathing is even and non-labored Psych: calm/pleasant demeanor Neuro: Alert and Oriented x 3, + facial symmetry, speech is clear.   Assessment and Plan: 1. Exposure to COVID-19 virus Patient is starting with COVID-19 symptoms and has positive household exposure.  Her home antigen test was negative today.  Discussed with her this may have been tested too soon and could be a false negative.  She is going to retest again this weekend.  As it is Friday currently, I am going to send a prescription for molnupiravir for her to start on once this test does come back positive.  Possible side effects of diarrhea and dizziness discussed.  She is within the 5-day window and she is at more risk given that she has only 1 kidney, which is also why we are avoiding Paxlovid at this time. Risks versus benefits discussed.  Advised self-isolation at home for the next 5 days and then masking around others for at least an additional 5 days.  Treat supportively at  this time as well including sleeping prone, deep breathing exercises, pushing fluids, walking every few hours, vitamins C and D, and Tylenol or ibuprofen as needed.  I also sent a prescription for Tessalon Perles for cough.  The patient understands that COVID-19 illness can wax and wane.  Should the symptoms acutely worsen or patient starts to experience sudden shortness of breath, chest pain, severe weakness, the patient will go straight to the emergency department.  Also advised home pulse oximetry monitoring and for any reading consistently under 92%, should also report to the emergency department.  The patient will continue to keep [place] updated.   Follow Up Instructions:    I discussed the assessment and treatment plan with the patient. The patient was provided an opportunity to ask questions and all were answered. The patient agreed with the plan and demonstrated an understanding of the instructions.   The patient was advised to call back or seek an in-person evaluation if the symptoms worsen or if the condition fails to improve as anticipated.  Kayleigh Broadwell M Abrianna Sidman, PA-C

## 2020-08-28 NOTE — Patient Instructions (Signed)
Okay to start on the antiviral if your COVID test comes back positive this weekend.  Please send a message through [place] and keep me updated on how you are doing.

## 2020-08-31 ENCOUNTER — Encounter: Payer: Self-pay | Admitting: Physician Assistant

## 2020-09-03 ENCOUNTER — Emergency Department (HOSPITAL_BASED_OUTPATIENT_CLINIC_OR_DEPARTMENT_OTHER)
Admission: EM | Admit: 2020-09-03 | Discharge: 2020-09-03 | Disposition: A | Discharge status: Home / Self Care | Payer: Commercial Managed Care - PPO | Attending: Emergency Medicine | Admitting: Emergency Medicine

## 2020-09-03 ENCOUNTER — Encounter (HOSPITAL_BASED_OUTPATIENT_CLINIC_OR_DEPARTMENT_OTHER): Payer: Self-pay | Admitting: Emergency Medicine

## 2020-09-03 ENCOUNTER — Other Ambulatory Visit: Payer: Self-pay

## 2020-09-03 DIAGNOSIS — Z7982 Long term (current) use of aspirin: Secondary | ICD-10-CM | POA: Diagnosis not present

## 2020-09-03 DIAGNOSIS — I129 Hypertensive chronic kidney disease with stage 1 through stage 4 chronic kidney disease, or unspecified chronic kidney disease: Secondary | ICD-10-CM | POA: Insufficient documentation

## 2020-09-03 DIAGNOSIS — E1122 Type 2 diabetes mellitus with diabetic chronic kidney disease: Secondary | ICD-10-CM | POA: Insufficient documentation

## 2020-09-03 DIAGNOSIS — U071 COVID-19: Secondary | ICD-10-CM | POA: Diagnosis not present

## 2020-09-03 DIAGNOSIS — N189 Chronic kidney disease, unspecified: Secondary | ICD-10-CM | POA: Diagnosis not present

## 2020-09-03 DIAGNOSIS — R0602 Shortness of breath: Secondary | ICD-10-CM

## 2020-09-03 DIAGNOSIS — Z794 Long term (current) use of insulin: Secondary | ICD-10-CM | POA: Insufficient documentation

## 2020-09-03 MED ORDER — ALBUTEROL SULFATE HFA 108 (90 BASE) MCG/ACT IN AERS
2.0000 | INHALATION_SPRAY | Freq: Once | RESPIRATORY_TRACT | Status: AC
  Administered 2020-09-03: 2 via RESPIRATORY_TRACT
  Filled 2020-09-03: qty 6.7

## 2020-09-03 MED ORDER — ALBUTEROL SULFATE HFA 108 (90 BASE) MCG/ACT IN AERS: 1.0000 | INHALATION_SPRAY | Freq: Four times a day (QID) | RESPIRATORY_TRACT | 0 refills | Status: DC | PRN

## 2020-09-03 MED ORDER — ACETAMINOPHEN 325 MG PO TABS
650.0000 mg | ORAL_TABLET | Freq: Once | ORAL | Status: AC
  Administered 2020-09-03: 650 mg via ORAL
  Filled 2020-09-03: qty 2

## 2020-09-03 MED ORDER — ACETAMINOPHEN 325 MG PO TABS: 650.0000 mg | ORAL_TABLET | Freq: Every day | ORAL | 0 refills | Status: AC | PRN

## 2020-09-03 NOTE — ED Triage Notes (Addendum)
Pt presents to ED POV. Pt reports that she is covid+ since last Thursday. Pt told by PCP to come here bc pulse ox has not been above 92% today. 97% on RA, dyspnea w/ exertion in triage. Taking antiviral at home.

## 2020-09-03 NOTE — ED Notes (Signed)
Dc instructions reviewed with patient. Pt states understanding of dc instructions and medications.

## 2020-09-03 NOTE — ED Provider Notes (Signed)
[facility] DEPT Provider Note   CSN: [id number] Arrival date & time: 09/03/20  1642     History Chief Complaint  Patient presents with   Hypoxia   Covid Positive    [name] [name] is a 55 y.o. female.  Patient is a 55 yo female with PMH of renal transplant dx with Covid 19 six days ago with symptom onset 7 days ago presenting for sob. Pt states her original symptoms including sore throat, nasal congestion, body aches, coughing and headache. States all symptoms have resolved besides headache. Admits to new shortness of breath x 3 days. States she came in today bc her home pulse ox read 92. Patient oxygren 97% on room air while resting in triage. Denies chest pain. Completed Paxlovid.  The history is provided by the patient. No language interpreter was used.  Shortness of Breath Severity:  Moderate Onset quality:  Gradual Timing:  Constant Progression:  Worsening Associated symptoms: headaches   Associated symptoms: no abdominal pain, no chest pain, no cough, no ear pain, no fever, no rash, no sore throat and no vomiting       Past Medical History:  Diagnosis Date   Arthritis    Chronic kidney disease    Diabetes mellitus without complication ([place])    Eczema    Enlarged kidney    bilateral   Gout    previously   History of chicken pox    History of recurrent UTIs    Hyperlipidemia    reports doesnt have elevated lipids, but takes due to hx of kidney transplant    Hypertension    reports BP normalized after transplant   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    Polycystic kidney disease    Psoriasis     Patient Active Problem List   Diagnosis Date Noted   Acute lower gastrointestinal bleeding 06/27/2019   Obesity, Class III, BMI 40-49.9 (morbid obesity) ([place]) 06/27/2019   H/O right hemicolectomy 06/27/2019   GI bleed 06/27/2019   Acute GI bleeding 06/26/2019   PTDM (post-transplant diabetes  mellitus) ([place]) 06/07/2018   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   complete   BIOPSY  12/05/2017   Procedure: BIOPSY;  Surgeon: Ronnette Juniper, MD;  Location: [facility];  Service: Gastroenterology;;   CATARACT EXTRACTION  2011   bilateral   [place] N/A 12/05/2017   Procedure: COLONOSCOPY;  Surgeon: Ronnette Juniper, MD;  Location: [facility];  Service: Gastroenterology;  Laterality: N/A;   HERNIA REPAIR  [id number]   umbilical hernia   HERNIA REPAIR  2013   NEPHRECTOMY TRANSPLANTED ORGAN     PERITONEAL CATHETER INSERTION     VENTRAL HERNIA REPAIR  07/04/2011   Procedure: HERNIA REPAIR VENTRAL ADULT;  Surgeon: Imogene Burn. Georgette Dover, MD;  Location: [facility];  Service: General;  Laterality: N/A;     OB History     Gravida  2   Para  2   Term      Preterm      AB      Living  2      SAB  IAB      Ectopic      Multiple      Live Births              Family History  Problem Relation Age of Onset   Arthritis Mother    Hypertension Father    Polycystic kidney disease Father        died before starting dialysis was near ESRD   Cerebral aneurysm Father        died age 15   Stroke Father    Arthritis Father    Cancer Sister 48       cancer that developed in the fatty tissues of the muscle   Kidney disease Brother    Hypertension Brother    Polycystic kidney disease Brother        transplant  7 years ago   Cancer Maternal Grandfather    Cancer Paternal Grandmother    Arthritis Paternal Grandmother    Cancer Paternal Grandfather    Hearing loss Paternal Grandfather      Social History   Tobacco Use   Smoking status: Never   Smokeless tobacco: Never  Vaping Use   Vaping Use: Never used  Substance Use Topics   Alcohol use: No   Drug use: No    Home Medications Prior to Admission medications   Medication Sig Start Date End Date Taking? Authorizing Provider  acetaminophen (TYLENOL) 325 MG tablet Take 650 mg by mouth daily as needed for mild pain or headache.     [provider]  aspirin 81 MG EC tablet Take 81 mg by mouth daily.     [provider]  atorvastatin (LIPITOR) 40 MG tablet Take 40 mg by mouth at bedtime.  08/07/10   [provider]  benzonatate (TESSALON PERLES) 100 MG capsule Take 1 capsule (100 mg total) by mouth 3 (three) times daily as needed for cough. 08/28/20   Allwardt, Randa Evens, PA-C  Cholecalciferol (VITAMIN D3) 50 MCG (2000 UT) TABS Take 2,000 Units by mouth in the morning, at noon, and at bedtime.    [provider]  CONTOUR NEXT TEST test strip 3 (three) times daily.  02/12/18   [provider]  insulin aspart protamine- aspart (NOVOLOG MIX 70/30) (70-30) 100 UNIT/ML injection Inject 0.25 mLs (25 Units total) into the skin See admin instructions. Inject 25 units into the skin in the morning and 30 units after supper 12/27/19   Orma Flaming, MD  Insulin Pen Needle 31G X 5 MM MISC USE AS DIRECTED ONCE DAILY 03/27/17   [provider]  MYFORTIC 360 MG TBEC EC tablet Take 360 mg by mouth 2 (two) times daily.    [provider]  PROGRAF 1 MG capsule Take 1 mg by mouth 2 (two) times daily.    [provider]  psyllium (HYDROCIL/METAMUCIL) 95 % PACK Take 1 packet by mouth 2 (two) times daily. 06/28/19   Little Ishikawa, MD  sodium bicarbonate 650 MG tablet Take 650 mg by mouth 2 (two) times daily. 10/07/19   [provider]    Allergies    Tape and Vancomycin  Review of Systems   Review of Systems  Constitutional:  Negative for chills and fever.   HENT:  Negative for ear pain and sore throat.   Eyes:  Negative for pain and visual disturbance.  Respiratory:  Positive for shortness of breath. Negative for cough.   Cardiovascular:  Negative for chest pain and palpitations.  Gastrointestinal:  Negative for abdominal pain and vomiting.  Genitourinary:  Negative for dysuria and hematuria.  Musculoskeletal:  Negative for arthralgias and back pain.  Skin:  Negative for color change and rash.  Neurological:  Positive for headaches. Negative for seizures and syncope.  All other systems reviewed and are negative.  Physical Exam Updated Vital Signs BP (!) 158/104 (BP Location: Right Wrist)   Pulse 93   Temp 98.1 F (36.7 C) (Oral)   Resp 20   Ht '5\' 6"'$  (1.676 m)   Wt 129.3 kg   LMP  (LMP Unknown)   SpO2 97%   BMI 46.00 kg/m   Physical Exam Vitals and nursing note reviewed.  Constitutional:      General: She is not in acute distress.    Appearance: She is well-developed.  HENT:     Head: Normocephalic and atraumatic.  Eyes:     Conjunctiva/sclera: Conjunctivae normal.  Cardiovascular:     Rate and Rhythm: Normal rate and regular rhythm.     Heart sounds: No murmur heard. Pulmonary:     Effort: Pulmonary effort is normal. No respiratory distress.     Breath sounds: Normal breath sounds.  Abdominal:     Palpations: Abdomen is soft.     Tenderness: There is no abdominal tenderness.  Musculoskeletal:     Cervical back: Neck supple.  Skin:    General: Skin is warm and dry.  Neurological:     Mental Status: She is alert.    ED Results / Procedures / Treatments   Labs (all labs ordered are listed, but only abnormal results are displayed) Labs Reviewed - No data to display  EKG None  Radiology No results found.  Procedures Procedures   Medications Ordered in ED Medications - No data to display  ED Course  I have reviewed the triage vital signs and the nursing notes.  Pertinent labs & imaging results that were  available during my care of the patient were reviewed by me and considered in my medical decision making (see chart for details).    MDM Rules/Calculators/A&P                          6:19 PM 55 yo female with PMH of renal transplant dx with Covid 19 six days ago with symptom onset 7 days ago presenting for sob  -Use albuterol inhaler, 2 puffs, every 4-6 hours as needed for shortness of breath -Tylenol for body aches/fever -Return to ED if shortness of breath worsens, does not get better with rest, and does not improve with inhaler use.  -Return to ED if pulse ox readings below 90%  Patient in no distress and overall condition improved here in the ED. Detailed discussions were had with the patient regarding current findings, and need for close f/u with PCP or on call doctor. The patient has been instructed to return immediately if the symptoms worsen in any way for re-evaluation. Patient verbalized understanding and is in agreement with current care plan. All questions answered prior to discharge.   Final Clinical Impression(s) / ED Diagnoses Final diagnoses:  SOB (shortness of breath)  COVID    Rx / DC Orders ED Discharge Orders     None        Lianne Cure, DO [id number]

## 2020-09-03 NOTE — Discharge Instructions (Addendum)
-  Use albuterol inhaler, 2 puffs, every 4-6 hours as needed for shortness of breath -Tylenol for body aches/fever -Return to ED if shortness of breath worsens, does not get better with rest, and does not improve with inhaler use.  -Return to ED if pulse ox readings below 90%

## 2020-09-03 NOTE — Telephone Encounter (Signed)
Please advise 

## 2020-09-07 NOTE — Telephone Encounter (Signed)
Per chart review, pt was seen in ER on 09/03/20 for this concern.

## 2020-10-06 LAB — HM DIABETES EYE EXAM

## 2020-10-09 ENCOUNTER — Encounter: Payer: Self-pay | Admitting: Physician Assistant

## 2020-10-09 LAB — HM DIABETES EYE EXAM

## 2021-01-12 ENCOUNTER — Ambulatory Visit (INDEPENDENT_AMBULATORY_CARE_PROVIDER_SITE_OTHER): Payer: Commercial Managed Care - PPO | Admitting: Physician Assistant

## 2021-01-12 ENCOUNTER — Ambulatory Visit: Payer: Commercial Managed Care - PPO | Admitting: Physician Assistant

## 2021-01-12 ENCOUNTER — Other Ambulatory Visit: Payer: Self-pay

## 2021-01-12 VITALS — BP 146/95 | HR 79 | Temp 98.3°F | Ht 66.0 in | Wt 290.2 lb

## 2021-01-12 DIAGNOSIS — J069 Acute upper respiratory infection, unspecified: Secondary | ICD-10-CM

## 2021-01-12 MED ORDER — BENZONATATE 100 MG PO CAPS: 100.0000 mg | ORAL_CAPSULE | Freq: Three times a day (TID) | ORAL | 0 refills | Status: DC | PRN

## 2021-01-12 NOTE — Progress Notes (Signed)
Subjective:    Patient ID: [name] [name], female    DOB: [birth year], 56 y.o.   MRN: [id number]  Chief Complaint  Patient presents with   Hoarse    HPI Patient is in today for hoarseness / URI symptoms.  Symptom onset: 6 days ago  Pertinent positives: ST, dry cough, hoarse voice, some sinus congestion  Pertinent negatives: Fever, chills, SOB, CP, headache Treatments tried: Benadryl  Vaccine status: 3 COVID-19 vaccines, did not update flu vaccine this year Sick exposure: Co-worker with a "cold" last week  COVID-19 home test today was negative.    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   complete   BIOPSY  12/05/2017   Procedure: BIOPSY;  Surgeon: Ronnette Juniper, MD;  Location: [facility];  Service: Gastroenterology;;   CATARACT EXTRACTION  2011   bilateral   [place] N/A 12/05/2017   Procedure: COLONOSCOPY;  Surgeon: Ronnette Juniper, MD;  Location: [facility];  Service: Gastroenterology;  Laterality: N/A;   HERNIA REPAIR  [id number]   umbilical hernia   HERNIA REPAIR  2013   NEPHRECTOMY TRANSPLANTED ORGAN     PERITONEAL CATHETER INSERTION     VENTRAL HERNIA REPAIR  07/04/2011   Procedure: HERNIA REPAIR VENTRAL ADULT;  Surgeon: Imogene Burn. Georgette Dover, MD;  Location: [facility];  Service: General;  Laterality: N/A;    Family History  Problem Relation Age of Onset   Arthritis  Mother    Hypertension Father    Polycystic kidney disease Father        died before starting dialysis was near ESRD   Cerebral aneurysm Father        died age 76   Stroke Father    Arthritis Father    Cancer Sister 87       cancer that developed in the fatty tissues of the muscle   Kidney disease Brother    Hypertension Brother    Polycystic kidney disease Brother        transplant  7 years ago   Cancer Maternal Grandfather    Cancer Paternal Grandmother    Arthritis Paternal Grandmother    Cancer Paternal Grandfather    Hearing loss Paternal Grandfather     Social History   Tobacco Use   Smoking status: Never   Smokeless tobacco: Never  Vaping Use   Vaping Use: Never used  Substance Use Topics   Alcohol use: No   Drug use: No     Allergies  Allergen Reactions   Tape Rash and Other (See Comments)    Tears the skin- please use an alternative   Vancomycin Hives, Itching, Other (See Comments) and Rash    Received via IV    Review of Systems NEGATIVE UNLESS OTHERWISE INDICATED IN  HPI      Objective:     BP (!) 146/95    Pulse 79    Temp 98.3 F (36.8 C)    Ht 5\' 6"  (1.676 m)    Wt 290 lb 3.2 oz (131.6 kg)    LMP  (LMP Unknown)    SpO2 96%    BMI 46.84 kg/m   Wt Readings from Last 3 Encounters:  01/12/21 290 lb 3.2 oz (131.6 kg)  09/03/20 285 lb (129.3 kg)  07/28/20 290 lb 3.2 oz (131.6 kg)    BP Readings from Last 3 Encounters:  01/12/21 (!) 146/95  09/03/20 (!) 158/104  07/28/20 134/88     Physical Exam Vitals and nursing note reviewed.  Constitutional:      Appearance: Normal appearance. She is normal weight. She is not toxic-appearing.  HENT:     Head: Normocephalic and atraumatic.     Right Ear: Tympanic membrane, ear canal and external ear normal.     Left Ear: Tympanic membrane, ear canal and external ear normal.     Nose: Congestion (some) present.     Mouth/Throat:     Mouth: Mucous membranes are moist.  Eyes:     Extraocular  Movements: Extraocular movements intact.     Conjunctiva/sclera: Conjunctivae normal.     Pupils: Pupils are equal, round, and reactive to light.  Cardiovascular:     Rate and Rhythm: Normal rate and regular rhythm.     Pulses: Normal pulses.     Heart sounds: Normal heart sounds.  Pulmonary:     Effort: Pulmonary effort is normal.     Breath sounds: Normal breath sounds.  Abdominal:     General: Abdomen is flat. Bowel sounds are normal.     Palpations: Abdomen is soft.  Musculoskeletal:        General: Normal range of motion.     Cervical back: Normal range of motion and neck supple.  Skin:    General: Skin is warm and dry.  Neurological:     General: No focal deficit present.     Mental Status: She is alert and oriented to person, place, and time.  Psychiatric:        Mood and Affect: Mood normal.        Behavior: Behavior normal.        Thought Content: Thought content normal.        Judgment: Judgment normal.       Assessment & Plan:   Problem List Items Addressed This Visit   None Visit Diagnoses     Acute URI    -  Primary        Meds ordered this encounter  Medications   benzonatate (TESSALON PERLES) 100 MG capsule    Sig: Take 1 capsule (100 mg total) by mouth 3 (three) times daily as needed for cough.    Dispense:  30 capsule    Refill:  0    1. Acute URI -Reassured patient nothing on exam to suggest bacterial etiology of her infection -Most likely viral URI, needs to give this more time - push fluids, nasal saline, humidifier, rest -Tessalon perles for added relief of cough -Consider antibiotics if worse or no improvement after 10-14 days of symptoms   Jaidence Geisler M Cinthia Rodden, PA-C

## 2021-01-13 ENCOUNTER — Ambulatory Visit: Payer: Commercial Managed Care - PPO | Admitting: Physician Assistant

## 2021-01-14 ENCOUNTER — Ambulatory Visit: Payer: Commercial Managed Care - PPO | Admitting: Physician Assistant

## 2021-01-15 ENCOUNTER — Other Ambulatory Visit: Payer: Self-pay | Admitting: Physician Assistant

## 2021-01-15 ENCOUNTER — Encounter: Payer: Self-pay | Admitting: Physician Assistant

## 2021-01-15 MED ORDER — AMOXICILLIN-POT CLAVULANATE 875-125 MG PO TABS: 1.0000 | ORAL_TABLET | Freq: Two times a day (BID) | ORAL | 0 refills | Status: DC

## 2021-01-15 NOTE — Telephone Encounter (Signed)
Called patient and informed her that medication was sent to the pharmacy. Patient verbalized understanding.

## 2021-01-15 NOTE — Telephone Encounter (Signed)
Patient husband calling back to f/u on message below please advise.

## 2021-02-01 NOTE — Telephone Encounter (Signed)
Patient scheduled with Alyssa for 02/02/2021.

## 2021-02-02 ENCOUNTER — Other Ambulatory Visit: Payer: Self-pay

## 2021-02-02 ENCOUNTER — Ambulatory Visit: Payer: Commercial Managed Care - PPO | Admitting: Physician Assistant

## 2021-02-02 ENCOUNTER — Ambulatory Visit (INDEPENDENT_AMBULATORY_CARE_PROVIDER_SITE_OTHER): Payer: Commercial Managed Care - PPO | Admitting: Physician Assistant

## 2021-02-02 ENCOUNTER — Encounter: Payer: Self-pay | Admitting: Physician Assistant

## 2021-02-02 VITALS — BP 120/80 | HR 84 | Temp 98.1°F | Ht 66.0 in | Wt 290.0 lb

## 2021-02-02 DIAGNOSIS — J029 Acute pharyngitis, unspecified: Secondary | ICD-10-CM | POA: Diagnosis not present

## 2021-02-02 DIAGNOSIS — R5383 Other fatigue: Secondary | ICD-10-CM

## 2021-02-02 DIAGNOSIS — J370 Chronic laryngitis: Secondary | ICD-10-CM | POA: Diagnosis not present

## 2021-02-02 LAB — COMPREHENSIVE METABOLIC PANEL
ALT: 21 U/L (ref 0–35)
AST: 19 U/L (ref 0–37)
Albumin: 4 g/dL (ref 3.5–5.2)
Alkaline Phosphatase: 63 U/L (ref 39–117)
BUN: 16 mg/dL (ref 6–23)
CO2: 23 mEq/L (ref 19–32)
Calcium: 8.9 mg/dL (ref 8.4–10.5)
Chloride: 106 mEq/L (ref 96–112)
Creatinine, Ser: 0.96 mg/dL (ref 0.40–1.20)
GFR: 66.56 mL/min (ref >60.00)
Glucose, Bld: 319 mg/dL — ABNORMAL HIGH (ref 70–99)
Potassium: 3.9 mEq/L (ref 3.5–5.1)
Sodium: 139 mEq/L (ref 135–145)
Total Bilirubin: 0.6 mg/dL (ref 0.2–1.2)
Total Protein: 6.4 g/dL (ref 6.0–8.3)

## 2021-02-02 LAB — CBC WITH DIFFERENTIAL/PLATELET
Basophils Absolute: 0 10*3/uL (ref 0.0–0.1)
Basophils Relative: 0.8 % (ref 0.0–3.0)
Eosinophils Absolute: 0.2 10*3/uL (ref 0.0–0.7)
Eosinophils Relative: 3.7 % (ref 0.0–5.0)
HCT: 39.2 % (ref 36.0–46.0)
Hemoglobin: 12.6 g/dL (ref 12.0–15.0)
Lymphocytes Relative: 20.8 % (ref 12.0–46.0)
Lymphs Abs: 0.9 10*3/uL (ref 0.7–4.0)
MCHC: 32.2 g/dL (ref 30.0–36.0)
MCV: 89.7 fl (ref 78.0–100.0)
Monocytes Absolute: 0.4 10*3/uL (ref 0.1–1.0)
Monocytes Relative: 9.2 % (ref 3.0–12.0)
Neutro Abs: 2.8 10*3/uL (ref 1.4–7.7)
Neutrophils Relative %: 65.5 % (ref 43.0–77.0)
Platelets: 189 10*3/uL (ref 150.0–400.0)
RBC: 4.37 Mil/uL (ref 3.87–5.11)
RDW: 13.6 % (ref 11.5–15.5)
WBC: 4.2 10*3/uL (ref 4.0–10.5)

## 2021-02-02 LAB — TSH: TSH: 1.9 u[IU]/mL (ref 0.35–5.50)

## 2021-02-02 MED ORDER — IPRATROPIUM BROMIDE 0.06 % NA SOLN: 2.0000 | Freq: Four times a day (QID) | NASAL | 12 refills | Status: DC

## 2021-02-02 NOTE — Progress Notes (Signed)
[name] [name] is a 56 y.o. female here for a sore throat.  History of Present Illness:   Chief Complaint  Patient presents with   Sore Throat    Pt sore throat x 1 week, slight dry non-productive cough, headache off and on.   Fatigue    Pt has been feeling very tired the past month.    HPI  Sore Throat [name] initially saw [name], PA about this issue on 01/12/21. During that visit she explained she was experiencing sore throat, dry cough, hoarse voice, and sinus congestion.  She was given Ladona Ridgel during this visit and told she likely had a viral URI.  3 days later on 01/15/2021, patient sent a message saying she was not improving and her PCP sent in oral Augmentin.  Patient took oral Augmentin as prescribed.  Currently returns with c/o sore throat as well as dry non-productive cough and headache. She has taken an at home covid test which resulted as negative. At this time she is looking for additional relief and ways to improve the hoarseness of her voice. Denies fever or chills.    Fatigue Additionally pt expresses she has been having increased feelings of fatigue for the past month. Pt states that she believes she is getting an adequate amount of sleep nightly. She does have a hx of neuroendocrine neoplasm of GI tract and GI bleeds, but this has been regularly monitored by Dr. Linus Salmons. Denies lightheadedness, dizziness, or environmental changes.  She has monthly blood work from her care team and has not had blood work since middle of last month.    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   complete   BIOPSY  12/05/2017   Procedure: BIOPSY;  Surgeon: Ronnette Juniper, MD;  Location: [facility];  Service: Gastroenterology;;   CATARACT EXTRACTION  2011   bilateral   [place] N/A 12/05/2017   Procedure: COLONOSCOPY;  Surgeon: Ronnette Juniper, MD;  Location: [facility];  Service: Gastroenterology;  Laterality: N/A;   HERNIA REPAIR  1610   umbilical hernia   HERNIA REPAIR  2013   NEPHRECTOMY TRANSPLANTED ORGAN     PERITONEAL CATHETER INSERTION     VENTRAL HERNIA REPAIR  07/04/2011   Procedure: HERNIA REPAIR VENTRAL ADULT;  Surgeon: Imogene Burn. Georgette Dover, MD;  Location: [facility];  Service: General;  Laterality: N/A;    Family History  Problem Relation Age of Onset   Arthritis Mother    Hypertension Father    Polycystic kidney  disease Father        died before starting dialysis was near ESRD   Cerebral aneurysm Father        died age 27   Stroke Father    Arthritis Father    Cancer Sister 41       cancer that developed in the fatty tissues of the muscle   Kidney disease Brother    Hypertension Brother    Polycystic kidney disease Brother        transplant  7 years ago   Cancer Maternal Grandfather    Cancer Paternal Grandmother    Arthritis Paternal Grandmother    Cancer Paternal Grandfather    Hearing loss Paternal Grandfather     Allergies  Allergen Reactions   Tape Rash and Other (See Comments)    Tears the skin- please use an alternative   Vancomycin Hives, Itching, Other (See Comments) and Rash     Received via IV    Current Medications:   Current Outpatient Medications:    acetaminophen (TYLENOL) 325 MG tablet, Take 2 tablets (650 mg total) by mouth daily as needed for mild pain or headache., Disp: 30 tablet, Rfl: 0   albuterol (VENTOLIN HFA) 108 (90 Base) MCG/ACT inhaler, Inhale 1-2 puffs into the lungs every 6 (six) hours as needed for wheezing or shortness of breath., Disp: 1 each, Rfl: 0   aspirin 81 MG EC tablet, Take 81 mg by mouth daily. , Disp: , Rfl:    atorvastatin (LIPITOR) 40 MG tablet, Take 40 mg by mouth at bedtime. , Disp: , Rfl:    Cholecalciferol (VITAMIN D3) 50 MCG (2000 UT) TABS, Take 2,000 Units by mouth in the morning, at noon, and at bedtime., Disp: , Rfl:    CONTOUR NEXT TEST test strip, 3 (three) times daily. , Disp: , Rfl:    insulin aspart protamine- aspart (NOVOLOG MIX 70/30) (70-30) 100 UNIT/ML injection, Inject 0.25 mLs (25 Units total) into the skin See admin instructions. Inject 25 units into the skin in the morning and 30 units after supper, Disp: 10 mL, Rfl: 1   Insulin Pen Needle 31G X 5 MM MISC, USE AS DIRECTED ONCE DAILY, Disp: , Rfl:    MYFORTIC 360 MG TBEC EC tablet, Take 360 mg by mouth 2 (two) times daily., Disp: , Rfl:    PROGRAF 1 MG capsule, Take 1 mg by mouth 2 (two) times daily., Disp: , Rfl:    sodium bicarbonate 650 MG tablet, Take 650 mg by mouth daily., Disp: , Rfl:    psyllium (HYDROCIL/METAMUCIL) 95 % PACK, Take 1 packet by mouth 2 (two) times daily. (Patient not taking: Reported on 02/02/2021), Disp: 240 each, Rfl: 0   Review of Systems:   ROS Negative unless otherwise specified per HPI. Vitals:   Vitals:   02/02/21 1259  BP: 120/80  Pulse: 84  Temp: 98.1 F (36.7 C)  TempSrc: Temporal  SpO2: 98%  Weight: 290 lb (131.5 kg)  Height: 5\' 6"  (1.676 m)     Body mass index is 46.81 kg/m.  Physical Exam:   Physical Exam Vitals and nursing note reviewed.  Constitutional:      General: She is not in acute  distress.    Appearance: She is well-developed. She is not ill-appearing or toxic-appearing.  HENT:     Right Ear: A middle ear effusion is present.     Left Ear: A middle ear effusion is present.  Cardiovascular:     Rate and Rhythm:  Normal rate and regular rhythm.     Pulses: Normal pulses.     Heart sounds: Normal heart sounds, S1 normal and S2 normal.  Pulmonary:     Effort: Pulmonary effort is normal.     Breath sounds: Normal breath sounds.  Skin:    General: Skin is warm and dry.  Neurological:     Mental Status: She is alert.     GCS: GCS eye subscore is 4. GCS verbal subscore is 5. GCS motor subscore is 6.  Psychiatric:        Speech: Speech normal.        Behavior: Behavior normal. Behavior is cooperative.    Assessment and Plan:   Other fatigue No red flags on discussion Potentially multifactorial due to chronic medical issues and current illness Ordered labs today, will make recommendations accordingly  Informed patient that if results are within normal limits and fatigue persists, to follow up with PCP  Chronic laryngitis; Pharyngitis, unspecified etiology Due to duration of symptoms, we will refer to ENT for further evaluation Start OTC nasal saline spray such as simply saline or nasal saline lavage such as NeilMed Use Atrovent nasal spray, 2 sprays in each nostril 2-3 times daily  Also recommended she consider trialing over-the-counter antacid to help with possible laryngeal reflux We are also going to check for mono and we do blood work   [employer] C Ratch[place],acting as a Education administrator for [employer], PA.,have documented all relevant documentation on the behalf of Inda Coke, PA,as directed by  Inda Coke, PA while in the presence of Inda Coke, [place].  I, Inda Coke, [place], have reviewed all documentation for this visit. The documentation on 02/02/21 for the exam, diagnosis, procedures, and orders are all accurate and complete.   Inda Coke,  PA-C

## 2021-02-02 NOTE — Patient Instructions (Signed)
It was great to see you!  Start nasal saline spray (i.e., Simply Saline) or nasal saline lavage (i.e., NeilMed) prior to medicated nasal sprays.  Start Atrovent nasal spray (sent in!) -- 2 sprays 2-3 times per day  Please schedule blood work on your way out  If labs are normal and fatigue persists, please follow-up with Alyssa for further evaluation  Referral to ENT placed for chronic laryngitis -- consider over the counter reflux medication as discussed in the interim  Take care,  Inda Coke PA-C

## 2021-02-05 LAB — CMV ABS, IGG+IGM (CYTOMEGALOVIRUS)
CMV IgM: <30 AU/mL
Cytomegalovirus Ab-IgG: <0.6 U/mL

## 2021-02-10 ENCOUNTER — Ambulatory Visit (INDEPENDENT_AMBULATORY_CARE_PROVIDER_SITE_OTHER)
Admission: RE | Admit: 2021-02-10 | Discharge: 2021-02-10 | Disposition: A | Discharge status: Home / Self Care | Payer: Commercial Managed Care - PPO | Source: Ambulatory Visit | Attending: Physician Assistant | Admitting: Physician Assistant

## 2021-02-10 ENCOUNTER — Ambulatory Visit (INDEPENDENT_AMBULATORY_CARE_PROVIDER_SITE_OTHER): Payer: Commercial Managed Care - PPO | Admitting: Physician Assistant

## 2021-02-10 ENCOUNTER — Other Ambulatory Visit: Payer: Self-pay

## 2021-02-10 VITALS — BP 153/92 | HR 73 | Temp 97.3°F | Ht 66.0 in | Wt 290.2 lb

## 2021-02-10 DIAGNOSIS — R300 Dysuria: Secondary | ICD-10-CM | POA: Diagnosis not present

## 2021-02-10 DIAGNOSIS — R051 Acute cough: Secondary | ICD-10-CM | POA: Diagnosis not present

## 2021-02-10 LAB — POC URINALSYSI DIPSTICK (AUTOMATED)
Bilirubin, UA: NEGATIVE
Blood, UA: NEGATIVE
Glucose, UA: NEGATIVE
Ketones, UA: NEGATIVE
Leukocytes, UA: NEGATIVE
Nitrite, UA: NEGATIVE
Protein, UA: NEGATIVE
Spec Grav, UA: 1.025 (ref 1.010–1.025)
Urobilinogen, UA: 0.2 E.U./dL
pH, UA: 5.5 (ref 5.0–8.0)

## 2021-02-10 NOTE — Patient Instructions (Signed)
Please go to the lab today and provide a urine sample.  Please go to [facility] for Chest XRAY.    I will call with results and treat accordingly.

## 2021-02-10 NOTE — Progress Notes (Signed)
Subjective:    Patient ID: [name] [name], female    DOB: [birth year], 56 y.o.   MRN: [id number]  Chief Complaint  Patient presents with   URI    URI   Patient is in today for six weeks of symptoms (see initial visit from 01/12/21). -Still feeling fatigue - even took three days off last week to feel better and it didn't help. -Still having cough, ear pressure, hoarseness, nasal congestion some yellow / bloody production -She has also developed diarrhea and concerned about possible UTI. States she is having dysuria.  -She has not had a fever at all.   -Getting about 5-6 hours of sleep at one time.  She is using saline and Atrovent nasal spray. Also using an inhaler from when she had COVID-19 illness.   Completed Augmentin course over a week ago.   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   complete   BIOPSY  12/05/2017   Procedure: BIOPSY;  Surgeon: Ronnette Juniper, MD;  Location: [facility];  Service: Gastroenterology;;   CATARACT EXTRACTION  2011   bilateral   [place] N/A 12/05/2017   Procedure: COLONOSCOPY;  Surgeon: Ronnette Juniper, MD;  Location: [facility];  Service: Gastroenterology;  Laterality: N/A;   HERNIA REPAIR  6237   umbilical hernia   HERNIA REPAIR  2013   NEPHRECTOMY TRANSPLANTED ORGAN     PERITONEAL CATHETER INSERTION     VENTRAL HERNIA REPAIR   07/04/2011   Procedure: HERNIA REPAIR VENTRAL ADULT;  Surgeon: Imogene Burn. Georgette Dover, MD;  Location: [facility];  Service: General;  Laterality: N/A;    Family History  Problem Relation Age of Onset   Arthritis Mother    Hypertension Father    Polycystic kidney disease Father        died before starting dialysis was near ESRD   Cerebral aneurysm Father        died age 40   Stroke Father    Arthritis Father    Cancer Sister 52       cancer that developed in the fatty tissues of the muscle   Kidney disease Brother    Hypertension Brother    Polycystic kidney disease Brother        transplant  7 years ago   Cancer Maternal Grandfather    Cancer Paternal Grandmother    Arthritis Paternal Grandmother    Cancer Paternal Grandfather    Hearing loss Paternal Grandfather     Social History   Tobacco Use   Smoking status: Never   Smokeless tobacco: Never  Vaping Use   Vaping Use: Never used  Substance Use Topics   Alcohol use: No   Drug use: No     Allergies  Allergen Reactions  Tape Rash and Other (See Comments)    Tears the skin- please use an alternative   Vancomycin Hives, Itching, Other (See Comments) and Rash    Received via IV    Review of Systems NEGATIVE UNLESS OTHERWISE INDICATED IN HPI      Objective:     BP (!) 153/92    Pulse 73    Temp (!) 97.3 F (36.3 C)    Ht 5\' 6"  (1.676 m)    Wt 290 lb 3.2 oz (131.6 kg)    LMP  (LMP Unknown)    SpO2 97%    BMI 46.84 kg/m   Wt Readings from Last 3 Encounters:  02/10/21 290 lb 3.2 oz (131.6 kg)  02/02/21 290 lb (131.5 kg)  01/12/21 290 lb 3.2 oz (131.6 kg)    BP Readings from Last 3 Encounters:  02/10/21 (!) 153/92  02/02/21 120/80  01/12/21 (!) 146/95     Physical Exam Vitals and nursing note reviewed.  Constitutional:      Appearance: Normal appearance. She is normal weight. She is not toxic-appearing.  HENT:     Head: Normocephalic and atraumatic.     Right Ear: Tympanic membrane, ear canal and external ear  normal.     Left Ear: Tympanic membrane, ear canal and external ear normal.     Nose: Nose normal.     Mouth/Throat:     Mouth: Mucous membranes are moist.  Eyes:     Extraocular Movements: Extraocular movements intact.     Conjunctiva/sclera: Conjunctivae normal.     Pupils: Pupils are equal, round, and reactive to light.  Cardiovascular:     Rate and Rhythm: Normal rate and regular rhythm.     Pulses: Normal pulses.     Heart sounds: Normal heart sounds.  Pulmonary:     Effort: Pulmonary effort is normal.     Breath sounds: Normal breath sounds.     Comments: Harsh intermittent cough Abdominal:     Tenderness: There is no right CVA tenderness or left CVA tenderness.  Musculoskeletal:        General: Normal range of motion.     Cervical back: Normal range of motion and neck supple.  Skin:    General: Skin is warm and dry.  Neurological:     General: No focal deficit present.     Mental Status: She is alert and oriented to person, place, and time.  Psychiatric:        Mood and Affect: Mood normal.        Behavior: Behavior normal.        Thought Content: Thought content normal.        Judgment: Judgment normal.       Assessment & Plan:   Problem List Items Addressed This Visit   None Visit Diagnoses     Acute cough    -  Primary   Relevant Orders   DG Chest 2 View   Dysuria       Relevant Orders   POCT Urinalysis Dipstick (Automated)       1. Acute cough -Cough going on 5-6 weeks now -Still likely post-viral cough -Will obtain chest XRAY at this time to r/o underlying PNA or other etiology -Cont to push fluids, rest, consider pulmonology  2. Dysuria -UA in office today is clear, encouraged to push fluids, recheck prn   Gianelle Mccaul M Beverlyann Broxterman, PA-C

## 2021-02-15 ENCOUNTER — Encounter: Payer: Self-pay | Admitting: Physician Assistant

## 2021-07-09 ENCOUNTER — Other Ambulatory Visit: Payer: Self-pay | Admitting: Otolaryngology

## 2021-07-29 ENCOUNTER — Ambulatory Visit (INDEPENDENT_AMBULATORY_CARE_PROVIDER_SITE_OTHER): Payer: Commercial Managed Care - PPO | Admitting: Physician Assistant

## 2021-07-29 ENCOUNTER — Encounter: Payer: Self-pay | Admitting: Physician Assistant

## 2021-07-29 VITALS — BP 127/85 | HR 72 | Temp 98.0°F | Ht 66.0 in | Wt 287.2 lb

## 2021-07-29 DIAGNOSIS — E119 Type 2 diabetes mellitus without complications: Secondary | ICD-10-CM

## 2021-07-29 DIAGNOSIS — Z0000 Encounter for general adult medical examination without abnormal findings: Secondary | ICD-10-CM

## 2021-07-29 DIAGNOSIS — Q613 Polycystic kidney, unspecified: Secondary | ICD-10-CM

## 2021-07-29 DIAGNOSIS — Z940 Kidney transplant status: Secondary | ICD-10-CM

## 2021-07-29 DIAGNOSIS — Z780 Asymptomatic menopausal state: Secondary | ICD-10-CM

## 2021-07-29 DIAGNOSIS — D3A8 Other benign neuroendocrine tumors: Secondary | ICD-10-CM | POA: Diagnosis not present

## 2021-07-29 DIAGNOSIS — Z794 Long term (current) use of insulin: Secondary | ICD-10-CM

## 2021-07-29 NOTE — Progress Notes (Signed)
Subjective:    Patient ID: [name] [name], female    DOB: 09/06/1965, 56 y.o.   MRN: [id number]  Chief Complaint  Patient presents with   Annual Exam    Pt coming in for CPE; pt is fasting; pt has no concerns to discuss; pt scheduling Colonoscopy; Singles vaccine unsure since having a kidney transplant;     HPI Patient with hx of PCKD, neuroendocrine carcinoma of sm intestine, T2DM, transplanted kidney, is in today for annual exam.  -Endo - Currently on insulin now -ENT - will clean out sinuses  -Nephrologist - saw them in May; intermittent fasting and stationary biking, looking into swimming; set goal for 50 lb loss in one year  -Next PET Scan in September   Acute concerns: None  Health maintenance: Lifestyle/ exercise: Biking, swimming Nutrition: Water and lemonade; overall healthy diet Mental health: Doing well  Caffeine: None whatsoever  Sleep: Doing well for the most part Substance use: None ETHO: None  Sexual activity: Monogamous with husband, no concerns  Immunizations: Going to ask nephrologist about Shingrix (she's on long-term immuno-suppressive therapy)  Colonoscopy: Scheduling for upper and lower scope with [facility] due to fluctuation in stools Pap: Hysterectomy hx; plan for pelvic exam next year  Mammogram: She will schedule  DEXA: Had one about 5 years ago, no problems; takes Vit D and eats plenty of dairy     Past Medical History:  Diagnosis Date   Arthritis    Chronic kidney disease    Diabetes mellitus without complication ([place])    Eczema    Enlarged kidney    bilateral   Gout    previously   History of chicken pox    History of recurrent UTIs    Hyperlipidemia    reports doesnt have elevated lipids, but takes due to hx of kidney transplant    Hypertension    reports BP normalized after transplant   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   complete   BIOPSY  12/05/2017   Procedure: BIOPSY;  Surgeon: Ronnette Juniper, MD;  Location: [facility];  Service: Gastroenterology;;   CATARACT EXTRACTION  01/10/2009   bilateral   [place]  05/2018   Ileum, appendix, part of small int and colon; 17 lymph nodes removed, one had cacner   COLONOSCOPY N/A 12/05/2017   Procedure: COLONOSCOPY;  Surgeon: Ronnette Juniper, MD;  Location: [facility];  Service: Gastroenterology;  Laterality: N/A;   HERNIA REPAIR  34/19/6222   umbilical hernia   HERNIA REPAIR  01/11/2011   NEPHRECTOMY TRANSPLANTED ORGAN     PERITONEAL CATHETER INSERTION     VENTRAL HERNIA REPAIR  07/04/2011   Procedure: HERNIA REPAIR VENTRAL ADULT;  Surgeon: Imogene Burn. Georgette Dover, MD;  Location: [facility] [facility];  Service: General;  Laterality: N/A;    Family History  Problem Relation Age of Onset   Arthritis Mother    Hypertension Father    Polycystic kidney disease Father        died before starting dialysis was near ESRD   Cerebral aneurysm Father        died age 70   Stroke Father    Arthritis Father  Cancer Sister 17       cancer that developed in the fatty tissues of the muscle   Kidney disease Brother    Hypertension Brother    Polycystic kidney disease Brother        transplant  7 years ago   Cancer Maternal Grandfather    Cancer Paternal Grandmother    Arthritis Paternal Grandmother    Cancer Paternal Grandfather    Hearing loss Paternal Grandfather     Social History   Tobacco Use   Smoking status: Never   Smokeless tobacco: Never  Vaping Use   Vaping Use: Never used  Substance Use Topics   Alcohol use: No   Drug use: No     Allergies  Allergen Reactions   Tape Rash and Other (See Comments)    Tears the skin- please use an alternative   Vancomycin Hives, Itching,  Other (See Comments) and Rash    Received via IV    Review of Systems NEGATIVE UNLESS OTHERWISE INDICATED IN HPI      Objective:     BP 127/85 (BP Location: Right Arm)   Pulse 72   Temp 98 F (36.7 C) (Temporal)   Ht '5\' 6"'$  (1.676 m)   Wt 287 lb 3.2 oz (130.3 kg)   LMP  (LMP Unknown)   SpO2 98%   BMI 46.36 kg/m   Wt Readings from Last 3 Encounters:  07/29/21 287 lb 3.2 oz (130.3 kg)  02/10/21 290 lb 3.2 oz (131.6 kg)  02/02/21 290 lb (131.5 kg)    BP Readings from Last 3 Encounters:  07/29/21 127/85  02/10/21 (!) 153/92  02/02/21 120/80     Physical Exam Vitals and nursing note reviewed.  Constitutional:      Appearance: Normal appearance. She is obese. She is not toxic-appearing.  HENT:     Head: Normocephalic and atraumatic.     Right Ear: Tympanic membrane, ear canal and external ear normal.     Left Ear: Tympanic membrane, ear canal and external ear normal.     Nose: Nose normal.     Mouth/Throat:     Mouth: Mucous membranes are moist.  Eyes:     Extraocular Movements: Extraocular movements intact.     Conjunctiva/sclera: Conjunctivae normal.     Pupils: Pupils are equal, round, and reactive to light.  Cardiovascular:     Rate and Rhythm: Normal rate and regular rhythm.     Pulses: Normal pulses.     Heart sounds: Normal heart sounds.  Pulmonary:     Effort: Pulmonary effort is normal.     Breath sounds: Normal breath sounds.  Abdominal:     General: Abdomen is flat. Bowel sounds are normal.     Palpations: Abdomen is soft.  Musculoskeletal:        General: Normal range of motion.     Cervical back: Normal range of motion and neck supple.  Skin:    General: Skin is warm and dry.     Findings: No lesion or rash.  Neurological:     General: No focal deficit present.     Mental Status: She is alert and oriented to person, place, and time.  Psychiatric:        Mood and Affect: Mood normal.        Behavior: Behavior normal.        Thought Content:  Thought content normal.        Judgment: Judgment normal.  Assessment & Plan:   Problem List Items Addressed This Visit       Digestive   Neuroendocrine neoplasm of gastrointestinal tract   Relevant Orders   DG Bone Density     Endocrine   Diabetes mellitus type 2, insulin dependent ([place])   Relevant Orders   DG Bone Density     Genitourinary   Polycystic kidney disease     Other   Kidney replaced by transplant   Other Visit Diagnoses     Encounter for annual physical exam    -  Primary   Postmenopausal       Relevant Orders   DG Bone Density       Plan: Age-appropriate screening and counseling performed today. Labs are frequently checked with her specialists and she is UTD. Stays UTD with health maintenance. Will plan for bone density testing. Preventive measures discussed and printed in AVS for patient. She is doing well with nutrition and exercise.  Patient Counseling: '[x]'$   Nutrition: Stressed importance of moderation in sodium/caffeine intake, saturated fat and cholesterol, caloric balance, sufficient intake of fresh fruits, vegetables, and fiber.  '[x]'$   Stressed the importance of regular exercise.   '[]'$   Substance Abuse: Discussed cessation/primary prevention of tobacco, alcohol, or other drug use; driving or other dangerous activities under the influence; availability of treatment for abuse.   '[]'$   Injury prevention: Discussed safety belts, safety helmets, smoke detector, smoking near bedding or upholstery.   '[]'$   Sexuality: Discussed sexually transmitted diseases, partner selection, use of condoms, avoidance of unintended pregnancy  and contraceptive alternatives.   '[x]'$   Dental health: Discussed importance of regular tooth brushing, flossing, and dental visits.  '[x]'$   Health maintenance and immunizations reviewed. Please refer to Health maintenance section.      Return in about 1 year (around 07/30/2022) for fasting labs and CPE .    Tajuan Dufault M Deborra Phegley,  PA-C

## 2021-07-29 NOTE — Patient Instructions (Signed)
Keep up the great work!! Good to see you! Annual dental / vision exams.  I'll look into bone density info.  Keep me updated how you're doing!

## 2021-08-13 ENCOUNTER — Other Ambulatory Visit: Payer: Self-pay | Admitting: Physician Assistant

## 2021-08-13 DIAGNOSIS — Z1231 Encounter for screening mammogram for malignant neoplasm of breast: Secondary | ICD-10-CM

## 2021-09-16 ENCOUNTER — Encounter (HOSPITAL_COMMUNITY): Payer: Self-pay | Admitting: Otolaryngology

## 2021-09-16 ENCOUNTER — Other Ambulatory Visit: Payer: Self-pay

## 2021-09-16 NOTE — Progress Notes (Signed)
[name] [name] denies chest pain or shortness of breath.  Patient denies having any s/s of Covid in her household, also denies any known exposure to Covid.   [name] [name]'s PCP is [name] Allward,  PA-C, Nephrologist is Dr. Moshe Cipro, Endocrinologist is Dr. Chalmers Cater.  [name] [name] has type II diabetes, patient reports last A1C drawn 08/30/21 at  the Nephrologist's office, was 7.3.  [name] [name] reports that the CBG increases when she does not eat. I instructed [name] [name] to take 1/2 of Tresiba dose at bedtime tonight = 15 units.  If CBG is greater than 220 take 1/2 of sliding scale Novolog insulin. I instructed [name] [name] to check CBG after awaking and every 2 hours until arrival  to the hospital.  I Instructed patient if CBG is less than 70 to take 4 Glucose Tablets or 1 tube of Glucose Gel or 1/2 cup of a clear juice. Recheck CBG in 15 minutes if CBG is not over 70 call, pre- op desk at [phone] for further instructions. If scheduled to receive Insulin, do not take Insulin

## 2021-09-17 ENCOUNTER — Ambulatory Visit (HOSPITAL_COMMUNITY): Payer: Commercial Managed Care - PPO | Admitting: Certified Registered Nurse Anesthetist

## 2021-09-17 ENCOUNTER — Encounter (HOSPITAL_COMMUNITY): Payer: Self-pay | Admitting: Otolaryngology

## 2021-09-17 ENCOUNTER — Ambulatory Visit (HOSPITAL_BASED_OUTPATIENT_CLINIC_OR_DEPARTMENT_OTHER): Payer: Commercial Managed Care - PPO | Admitting: Certified Registered Nurse Anesthetist

## 2021-09-17 ENCOUNTER — Encounter (HOSPITAL_COMMUNITY)
Admission: RE | Disposition: A | Discharge status: Home / Self Care | Payer: Self-pay | Source: Home / Self Care | Attending: Otolaryngology

## 2021-09-17 ENCOUNTER — Other Ambulatory Visit: Payer: Self-pay

## 2021-09-17 ENCOUNTER — Ambulatory Visit (HOSPITAL_COMMUNITY)
Admission: RE | Admit: 2021-09-17 | Discharge: 2021-09-17 | Disposition: A | Discharge status: Home / Self Care | Payer: Commercial Managed Care - PPO | Attending: Otolaryngology | Admitting: Otolaryngology

## 2021-09-17 DIAGNOSIS — J3489 Other specified disorders of nose and nasal sinuses: Secondary | ICD-10-CM | POA: Diagnosis not present

## 2021-09-17 DIAGNOSIS — N189 Chronic kidney disease, unspecified: Secondary | ICD-10-CM | POA: Diagnosis not present

## 2021-09-17 DIAGNOSIS — Z7984 Long term (current) use of oral hypoglycemic drugs: Secondary | ICD-10-CM

## 2021-09-17 DIAGNOSIS — Z6841 Body Mass Index (BMI) 40.0 and over, adult: Secondary | ICD-10-CM | POA: Diagnosis not present

## 2021-09-17 DIAGNOSIS — J310 Chronic rhinitis: Secondary | ICD-10-CM | POA: Insufficient documentation

## 2021-09-17 DIAGNOSIS — Z85038 Personal history of other malignant neoplasm of large intestine: Secondary | ICD-10-CM | POA: Diagnosis not present

## 2021-09-17 DIAGNOSIS — Z794 Long term (current) use of insulin: Secondary | ICD-10-CM | POA: Insufficient documentation

## 2021-09-17 DIAGNOSIS — J342 Deviated nasal septum: Secondary | ICD-10-CM | POA: Insufficient documentation

## 2021-09-17 DIAGNOSIS — I129 Hypertensive chronic kidney disease with stage 1 through stage 4 chronic kidney disease, or unspecified chronic kidney disease: Secondary | ICD-10-CM | POA: Diagnosis not present

## 2021-09-17 DIAGNOSIS — J343 Hypertrophy of nasal turbinates: Secondary | ICD-10-CM

## 2021-09-17 DIAGNOSIS — I10 Essential (primary) hypertension: Secondary | ICD-10-CM

## 2021-09-17 DIAGNOSIS — E119 Type 2 diabetes mellitus without complications: Secondary | ICD-10-CM

## 2021-09-17 DIAGNOSIS — Z940 Kidney transplant status: Secondary | ICD-10-CM | POA: Insufficient documentation

## 2021-09-17 DIAGNOSIS — E1122 Type 2 diabetes mellitus with diabetic chronic kidney disease: Secondary | ICD-10-CM | POA: Diagnosis not present

## 2021-09-17 HISTORY — PX: NASAL SEPTOPLASTY W/ TURBINOPLASTY: SHX2070

## 2021-09-17 HISTORY — DX: Dyspnea, unspecified: R06.00

## 2021-09-17 LAB — CBC
HCT: 39.7 % (ref 36.0–46.0)
Hemoglobin: 13.3 g/dL (ref 12.0–15.0)
MCH: 30.2 pg (ref 26.0–34.0)
MCHC: 33.5 g/dL (ref 30.0–36.0)
MCV: 90.2 fL (ref 80.0–100.0)
Platelets: 219 10*3/uL (ref 150–400)
RBC: 4.4 MIL/uL (ref 3.87–5.11)
RDW: 12.8 % (ref 11.5–15.5)
WBC: 8.8 10*3/uL (ref 4.0–10.5)
nRBC: 0 % (ref 0.0–0.2)

## 2021-09-17 LAB — BASIC METABOLIC PANEL
Anion gap: 9 (ref 5–15)
BUN: 15 mg/dL (ref 6–20)
CO2: 19 mmol/L — ABNORMAL LOW (ref 22–32)
Calcium: 8.8 mg/dL — ABNORMAL LOW (ref 8.9–10.3)
Chloride: 112 mmol/L — ABNORMAL HIGH (ref 98–111)
Creatinine, Ser: 1.05 mg/dL — ABNORMAL HIGH (ref 0.44–1.00)
GFR, Estimated: >60 mL/min (ref >60)
Glucose, Bld: 117 mg/dL — ABNORMAL HIGH (ref 70–99)
Potassium: 3.9 mmol/L (ref 3.5–5.1)
Sodium: 140 mmol/L (ref 135–145)

## 2021-09-17 LAB — GLUCOSE, CAPILLARY
Glucose-Capillary: 123 mg/dL — ABNORMAL HIGH (ref 70–99)
Glucose-Capillary: 92 mg/dL (ref 70–99)
Glucose-Capillary: 111 mg/dL — ABNORMAL HIGH (ref 70–99)

## 2021-09-17 SURGERY — SEPTOPLASTY, NOSE, WITH NASAL TURBINATE REDUCTION
Anesthesia: General | Laterality: Bilateral

## 2021-09-17 MED ORDER — ONDANSETRON HCL 4 MG/2ML IJ SOLN: 4.0000 mg | Freq: Once | INTRAMUSCULAR | Status: DC | PRN

## 2021-09-17 MED ORDER — 0.9 % SODIUM CHLORIDE (POUR BTL) OPTIME
TOPICAL | Status: DC | PRN
  Administered 2021-09-17: 1000 mL

## 2021-09-17 MED ORDER — ONDANSETRON HCL 4 MG/2ML IJ SOLN
INTRAMUSCULAR | Status: AC
  Filled 2021-09-17: qty 2

## 2021-09-17 MED ORDER — HYDROMORPHONE HCL 1 MG/ML IJ SOLN
INTRAMUSCULAR | Status: AC
  Filled 2021-09-17: qty 1

## 2021-09-17 MED ORDER — OXYCODONE HCL 5 MG PO TABS
5.0000 mg | ORAL_TABLET | Freq: Once | ORAL | Status: AC | PRN
  Administered 2021-09-17: 5 mg via ORAL

## 2021-09-17 MED ORDER — OXYCODONE-ACETAMINOPHEN 5-325 MG PO TABS: 1.0000 | ORAL_TABLET | ORAL | 0 refills | Status: AC | PRN

## 2021-09-17 MED ORDER — BACITRACIN ZINC 500 UNIT/GM EX OINT
TOPICAL_OINTMENT | CUTANEOUS | Status: AC
  Filled 2021-09-17: qty 28.35

## 2021-09-17 MED ORDER — CEFAZOLIN SODIUM-DEXTROSE 2-4 GM/100ML-% IV SOLN
INTRAVENOUS | Status: AC
  Filled 2021-09-17: qty 100

## 2021-09-17 MED ORDER — CEFAZOLIN SODIUM-DEXTROSE 2-3 GM-%(50ML) IV SOLR
INTRAVENOUS | Status: DC | PRN
  Administered 2021-09-17: 2 g via INTRAVENOUS

## 2021-09-17 MED ORDER — ACETAMINOPHEN 500 MG PO TABS
1000.0000 mg | ORAL_TABLET | Freq: Once | ORAL | Status: AC
  Administered 2021-09-17: 1000 mg via ORAL
  Filled 2021-09-17: qty 2

## 2021-09-17 MED ORDER — ROCURONIUM BROMIDE 10 MG/ML (PF) SYRINGE
PREFILLED_SYRINGE | INTRAVENOUS | Status: DC | PRN
  Administered 2021-09-17: 100 mg via INTRAVENOUS

## 2021-09-17 MED ORDER — SUGAMMADEX SODIUM 200 MG/2ML IV SOLN
INTRAVENOUS | Status: DC | PRN
  Administered 2021-09-17: 400 mg via INTRAVENOUS

## 2021-09-17 MED ORDER — HYDROMORPHONE HCL 1 MG/ML IJ SOLN
0.2500 mg | INTRAMUSCULAR | Status: DC | PRN
  Administered 2021-09-17 (×2): 0.5 mg via INTRAVENOUS

## 2021-09-17 MED ORDER — FENTANYL CITRATE (PF) 250 MCG/5ML IJ SOLN
INTRAMUSCULAR | Status: DC | PRN
  Administered 2021-09-17 (×5): 50 ug via INTRAVENOUS

## 2021-09-17 MED ORDER — DEXAMETHASONE SODIUM PHOSPHATE 10 MG/ML IJ SOLN
INTRAMUSCULAR | Status: AC
  Filled 2021-09-17: qty 1

## 2021-09-17 MED ORDER — ONDANSETRON HCL 4 MG/2ML IJ SOLN
INTRAMUSCULAR | Status: DC | PRN
  Administered 2021-09-17: 4 mg via INTRAVENOUS

## 2021-09-17 MED ORDER — AMISULPRIDE (ANTIEMETIC) 5 MG/2ML IV SOLN: 10.0000 mg | Freq: Once | INTRAVENOUS | Status: DC | PRN

## 2021-09-17 MED ORDER — CHLORHEXIDINE GLUCONATE 0.12 % MT SOLN
15.0000 mL | Freq: Once | OROMUCOSAL | Status: AC
  Administered 2021-09-17: 15 mL via OROMUCOSAL
  Filled 2021-09-17: qty 15

## 2021-09-17 MED ORDER — LACTATED RINGERS IV SOLN: INTRAVENOUS | Status: DC

## 2021-09-17 MED ORDER — PROPOFOL 10 MG/ML IV BOLUS
INTRAVENOUS | Status: AC
  Filled 2021-09-17: qty 20

## 2021-09-17 MED ORDER — LIDOCAINE-EPINEPHRINE 1 %-1:100000 IJ SOLN
INTRAMUSCULAR | Status: DC | PRN
  Administered 2021-09-17: 4 mL

## 2021-09-17 MED ORDER — OXYMETAZOLINE HCL 0.05 % NA SOLN
NASAL | Status: DC | PRN
  Administered 2021-09-17: 1

## 2021-09-17 MED ORDER — OXYCODONE HCL 5 MG/5ML PO SOLN: 5.0000 mg | Freq: Once | ORAL | Status: AC | PRN

## 2021-09-17 MED ORDER — OXYCODONE HCL 5 MG PO TABS
ORAL_TABLET | ORAL | Status: AC
  Filled 2021-09-17: qty 1

## 2021-09-17 MED ORDER — FENTANYL CITRATE (PF) 250 MCG/5ML IJ SOLN
INTRAMUSCULAR | Status: AC
  Filled 2021-09-17: qty 5

## 2021-09-17 MED ORDER — LIDOCAINE 2% (20 MG/ML) 5 ML SYRINGE
INTRAMUSCULAR | Status: DC | PRN
  Administered 2021-09-17: 60 mg via INTRAVENOUS

## 2021-09-17 MED ORDER — LIDOCAINE 2% (20 MG/ML) 5 ML SYRINGE
INTRAMUSCULAR | Status: AC
  Filled 2021-09-17: qty 5

## 2021-09-17 MED ORDER — AMOXICILLIN 875 MG PO TABS: 875.0000 mg | ORAL_TABLET | Freq: Two times a day (BID) | ORAL | 0 refills | Status: AC

## 2021-09-17 MED ORDER — DEXTROSE 50 % IV SOLN
INTRAVENOUS | Status: AC
  Filled 2021-09-17: qty 50

## 2021-09-17 MED ORDER — MIDAZOLAM HCL 2 MG/2ML IJ SOLN
INTRAMUSCULAR | Status: AC
  Filled 2021-09-17: qty 2

## 2021-09-17 MED ORDER — ORAL CARE MOUTH RINSE
15.0000 mL | Freq: Once | OROMUCOSAL | Status: AC
Start: 2021-09-17 — End: 2021-09-17

## 2021-09-17 MED ORDER — PROPOFOL 10 MG/ML IV BOLUS
INTRAVENOUS | Status: DC | PRN
  Administered 2021-09-17: 200 mg via INTRAVENOUS

## 2021-09-17 MED ORDER — MIDAZOLAM HCL 2 MG/2ML IJ SOLN
INTRAMUSCULAR | Status: DC | PRN
  Administered 2021-09-17: 2 mg via INTRAVENOUS

## 2021-09-17 MED ORDER — LIDOCAINE-EPINEPHRINE 1 %-1:100000 IJ SOLN
INTRAMUSCULAR | Status: AC
  Filled 2021-09-17: qty 1

## 2021-09-17 MED ORDER — DEXAMETHASONE SODIUM PHOSPHATE 10 MG/ML IJ SOLN
INTRAMUSCULAR | Status: DC | PRN
  Administered 2021-09-17: 8 mg via INTRAVENOUS

## 2021-09-17 MED ORDER — BUPIVACAINE-EPINEPHRINE (PF) 0.25% -1:200000 IJ SOLN
INTRAMUSCULAR | Status: AC
  Filled 2021-09-17: qty 90

## 2021-09-17 MED ORDER — ROCURONIUM BROMIDE 10 MG/ML (PF) SYRINGE
PREFILLED_SYRINGE | INTRAVENOUS | Status: AC
  Filled 2021-09-17: qty 10

## 2021-09-17 MED ORDER — CEFAZOLIN IN SODIUM CHLORIDE 3-0.9 GM/100ML-% IV SOLN: 3.0000 g | Freq: Once | INTRAVENOUS | Status: DC

## 2021-09-17 MED ORDER — BACITRACIN ZINC 500 UNIT/GM EX OINT
TOPICAL_OINTMENT | CUTANEOUS | Status: DC | PRN
  Administered 2021-09-17: 1 via TOPICAL

## 2021-09-17 MED ORDER — OXYMETAZOLINE HCL 0.05 % NA SOLN
NASAL | Status: AC
  Filled 2021-09-17: qty 30

## 2021-09-17 SURGICAL SUPPLY — 26 items
BAG COUNTER SPONGE SURGICOUNT (BAG) ×1 IMPLANT
BAG SPNG CNTER NS LX DISP (BAG)
CANISTER SUCT 3000ML PPV (MISCELLANEOUS) ×1 IMPLANT
COAGULATOR SUCT SWTCH 10FR 6 (ELECTROSURGICAL) ×1 IMPLANT
COVER SURGICAL LIGHT HANDLE (MISCELLANEOUS) IMPLANT
DRAPE HALF SHEET 40X57 (DRAPES) IMPLANT
ELECT REM PT RETURN 9FT ADLT (ELECTROSURGICAL) ×1
ELECTRODE REM PT RTRN 9FT ADLT (ELECTROSURGICAL) ×1 IMPLANT
GAUZE SPONGE 2X2 8PLY STRL LF (GAUZE/BANDAGES/DRESSINGS) ×1 IMPLANT
GLOVE ECLIPSE 7.5 STRL STRAW (GLOVE) ×1 IMPLANT
GOWN STRL REUS W/ TWL LRG LVL3 (GOWN DISPOSABLE) ×2 IMPLANT
GOWN STRL REUS W/TWL LRG LVL3 (GOWN DISPOSABLE) ×2
KIT BASIN OR (CUSTOM PROCEDURE TRAY) ×1 IMPLANT
KIT TURNOVER KIT B (KITS) ×1 IMPLANT
NDL HYPO 25GX1X1/2 BEV (NEEDLE) ×1 IMPLANT
NEEDLE HYPO 25GX1X1/2 BEV (NEEDLE) ×1 IMPLANT
NS IRRIG 1000ML POUR BTL (IV SOLUTION) ×1 IMPLANT
PAD ARMBOARD 7.5X6 YLW CONV (MISCELLANEOUS) ×2 IMPLANT
SPLINT NASAL DOYLE BI-VL (GAUZE/BANDAGES/DRESSINGS) ×1 IMPLANT
SPONGE NEURO XRAY DETECT 1X3 (DISPOSABLE) ×1 IMPLANT
SUT CHROMIC 4 0 SH 27 (SUTURE) ×1 IMPLANT
SUT PLAIN 4 0 ~~LOC~~ 1 (SUTURE) ×1 IMPLANT
SUT PROLENE 3 0 PS 2 (SUTURE) ×1 IMPLANT
TRAY ENT MC OR (CUSTOM PROCEDURE TRAY) ×1 IMPLANT
TUBE SALEM SUMP 16 FR W/ARV (TUBING) IMPLANT
TUBING EXTENTION W/L.L. (IV SETS) IMPLANT

## 2021-09-17 NOTE — Anesthesia Procedure Notes (Signed)
Procedure Name: Intubation Date/Time: 09/17/2021 1:53 PM  Performed by: Michele Rockers, CRNAPre-anesthesia Checklist: Patient identified, Patient being monitored, Timeout performed, Emergency Drugs available and Suction available Patient Re-evaluated:Patient Re-evaluated prior to induction Oxygen Delivery Method: Circle system utilized Preoxygenation: Pre-oxygenation with 100% oxygen Induction Type: IV induction Ventilation: Mask ventilation without difficulty Laryngoscope Size: Miller and 2 Grade View: Grade I Tube type: Oral Tube size: 7.0 mm Number of attempts: 1 Airway Equipment and Method: Stylet Placement Confirmation: ETT inserted through vocal cords under direct vision, positive ETCO2 and breath sounds checked- equal and bilateral Secured at: 21 cm Tube secured with: Tape Dental Injury: Teeth and Oropharynx as per pre-operative assessment

## 2021-09-17 NOTE — Op Note (Signed)
DATE OF PROCEDURE: 09/17/2021  OPERATIVE REPORT   SURGEON: Leta Baptist, MD   PREOPERATIVE DIAGNOSES:  1. Severe nasal septal deviation.  2. Bilateral inferior turbinate hypertrophy.  3. Chronic nasal obstruction.  POSTOPERATIVE DIAGNOSES:  1. Severe nasal septal deviation.  2. Bilateral inferior turbinate hypertrophy.  3. Chronic nasal obstruction.  PROCEDURE PERFORMED:  1. Septoplasty.  2. Bilateral partial inferior turbinate resection.   ANESTHESIA: General endotracheal tube anesthesia.   COMPLICATIONS: None.   ESTIMATED BLOOD LOSS: 100 mL.   INDICATION FOR PROCEDURE: [name] is a 56 y.o. female with a history of chronic nasal obstruction. The patient was treated with antihistamine, decongestant, and steroid nasal sprays. However, the patient continued to be symptomatic. On examination, the patient was noted to have bilateral severe inferior turbinate hypertrophy and significant nasal septal deviation, causing significant nasal obstruction. Based on the above findings, the decision was made for the patient to undergo the above-stated procedures. The risks, benefits, alternatives, and details of the procedures were discussed with the patient. Questions were invited and answered. Informed consent was obtained.   DESCRIPTION OF PROCEDURE: The patient was taken to the operating room and placed supine on the operating table. General endotracheal tube anesthesia was administered by the anesthesiologist. The patient was positioned, and prepped and draped in the standard fashion for nasal surgery. Pledgets soaked with Afrin were placed in both nasal cavities for decongestion.   Examination of the nasal cavity revealed a severe nasal septal deviation. 1% lidocaine with 1:100,000 epinephrine was injected onto the nasal septum bilaterally. A hemitransfixion incision was made on the left side. The mucosal flap was carefully elevated on the left side. A cartilaginous incision was made 1 cm  superior to the caudal margin of the nasal septum. Mucosal flap was also elevated on the right side in the similar fashion. It should be noted that due to the severe septal deviation, the deviated portion of the cartilaginous and bony septum had to be removed in piecemeal fashion. Once the deviated portions were removed, a straight midline septum was achieved. The septum was then quilted with 4-0 plain gut sutures. The hemitransfixion incision was closed with interrupted 4-0 chromic sutures.   The inferior one half of both hypertrophied inferior turbinate was crossclamped with a Kelly clamp. The inferior one half of each inferior turbinate was then resected with a pair of cross cutting scissors. Hemostasis was achieved with a suction cautery device. Doyle splints were applied to the nasal septum.  The care of the patient was turned over to the anesthesiologist. The patient was awakened from anesthesia without difficulty. The patient was extubated and transferred to the recovery room in good condition.   OPERATIVE FINDINGS: Severe nasal septal deviation and bilateral inferior turbinate hypertrophy.   SPECIMEN: None.   FOLLOWUP CARE: The patient be discharged home once she is awake and alert. The patient will be placed on Percocet p.r.n. pain, and amoxicillin 875 mg p.o. b.i.d. for 3 days. The patient will follow up in my office in 3 days for splint removal.   Larya Charpentier Raynelle Bring, MD

## 2021-09-17 NOTE — Anesthesia Postprocedure Evaluation (Signed)
Anesthesia Post Note  Patient: [name] [name]  Procedure(s) Performed: NASAL SEPTOPLASTY WITH TURBINATE REDUCTION (Bilateral)     Patient location during evaluation: PACU Anesthesia Type: General Level of consciousness: awake and alert, oriented and patient cooperative Pain management: pain level controlled Vital Signs Assessment: post-procedure vital signs reviewed and stable Respiratory status: spontaneous breathing, nonlabored ventilation and respiratory function stable Cardiovascular status: blood pressure returned to baseline and stable Postop Assessment: no apparent nausea or vomiting Anesthetic complications: no   No notable events documented.  Last Vitals:  Vitals:   09/17/21 1507 09/17/21 1522  BP: 137/73 (!) 133/59  Pulse: 86 80  Resp: 18 20  Temp: (!) 36.2 C   SpO2: 96% 93%    Last Pain:  Vitals:   09/17/21 1507  TempSrc:   PainSc: [place]

## 2021-09-17 NOTE — Discharge Instructions (Addendum)

## 2021-09-17 NOTE — H&P (Signed)
Cc: Chronic nasal obstruction  HPI: The patient is a 56 year old female who returns today for her follow-up evaluation.  The patient was previously seen for chronic nasal obstruction and nasal drainage.  At her last visit 6 weeks ago, she was noted to have nasal septal deviation and bilateral inferior turbinate hypertrophy.  She was treated with Flonase nasal spray and nasal saline irrigation.  The patient returns today complaining of persistent nasal obstruction.  She has not noted any improvement with the medical treatment.  She currently denies any facial pain, fever, or visual change.  Exam: General: Communicates without difficulty, well nourished, no acute distress. Head: Normocephalic, no evidence injury, no tenderness, facial buttresses intact without stepoff. Face/sinus: No tenderness to palpation and percussion. Facial movement is normal and symmetric. Eyes: PERRL, EOMI. No scleral icterus, conjunctivae clear. Neuro: CN II exam reveals vision grossly intact.  No nystagmus at any point of gaze. Ears: Auricles well formed without lesions.  Ear canals are intact without mass or lesion.  No erythema or edema is appreciated.  The TMs are intact without fluid. Nose: External evaluation reveals normal support and skin without lesions.  Dorsum is intact.  Anterior rhinoscopy reveals congested mucosa over anterior aspect of inferior turbinates and intact septum.  No purulence noted. Oral:  Oral cavity and oropharynx are intact, symmetric, without erythema or edema.  Mucosa is moist without lesions. Neck: Full range of motion without pain.  There is no significant lymphadenopathy.  No masses palpable.  Thyroid bed within normal limits to palpation.  Parotid glands and submandibular glands equal bilaterally without mass.  Trachea is midline. Neuro:  CN 2-12 grossly intact. Gait normal. A flexible scope was inserted into the right nasal cavity.  Endoscopy of the interior nasal cavity, superior, inferior, and  middle meatus was performed. The sphenoid-ethmoid recess was examined. Edematous mucosa was noted.  No polyp, mass, or lesion was appreciated. Nasal septal deviation noted.  Olfactory cleft was clear.  Nasopharynx was clear.  Turbinates were hypertrophied but without mass. The procedure was repeated on the contralateral side with similar findings.  The patient tolerated the procedure well.  Assessment: 1.  Chronic rhinitis with nasal mucosal congestion, nasal septal deviation, and bilateral inferior turbinate hypertrophy.  More than 95% of her nasal passageways are obstructed bilaterally. 2.  The patient has not responded to medical treatment, including allergy medications and steroid nasal sprays.  Plan: 1.  The nasal endoscopy findings are reviewed with the patient. 2.  Continue with Flonase nasal spray and nasal saline irrigation. 3.  In light of her persistent symptoms, she may benefit from surgical intervention with septoplasty and turbinate reduction.  The risk, benefits, and details of the procedures are reviewed. 4.  The patient would like to proceed with the procedures.

## 2021-09-17 NOTE — Transfer of Care (Signed)
Immediate Anesthesia Transfer of Care Note  Patient: [name] [name]  Procedure(s) Performed: NASAL SEPTOPLASTY WITH TURBINATE REDUCTION (Bilateral)  Patient Location: PACU  Anesthesia Type:General  Level of Consciousness: awake  Airway & Oxygen Therapy: Patient Spontanous Breathing  Post-op Assessment: Report given to RN and Post -op Vital signs reviewed and stable  Post vital signs: Reviewed and stable  Last Vitals:  Vitals Value Taken Time  BP 137/73 09/17/21 1507  Temp    Pulse 79 09/17/21 1509  Resp 19 09/17/21 1509  SpO2 94 % 09/17/21 1509  Vitals shown include unvalidated device data.  Last Pain:  Vitals:   09/17/21 1141  TempSrc:   PainSc: 0-No pain         Complications: No notable events documented.

## 2021-09-17 NOTE — Anesthesia Preprocedure Evaluation (Addendum)
Anesthesia Evaluation  Patient identified by MRN, date of birth, ID band Patient awake    Reviewed: Allergy & Precautions, NPO status , Patient's Chart, lab work & pertinent test results  Airway Mallampati: I  TM Distance: >3 FB Neck ROM: Full    Dental  (+) Teeth Intact, Dental Advisory Given   Pulmonary neg pulmonary ROS,    Pulmonary exam normal breath sounds clear to auscultation       Cardiovascular hypertension (150/85 in preop, per pt has been off antihypertensives since renal transplant ), Normal cardiovascular exam Rhythm:Regular Rate:Normal     Neuro/Psych negative neurological ROS  negative psych ROS   GI/Hepatic Neg liver ROS, 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   Endo/Other  diabetes, Well Controlled, Type 2, Insulin DependentMorbid obesityBMI 34 Took 7 units this AM FS 123 in preop   Renal/GU Renal diseasePCKD s/p renal transplant 2013  negative genitourinary   Musculoskeletal  (+) Arthritis , Osteoarthritis,    Abdominal (+) + obese,   Peds  Hematology negative hematology ROS (+)   Anesthesia Other Findings   Reproductive/Obstetrics negative OB ROS                            Anesthesia Physical Anesthesia Plan  ASA: 3  Anesthesia Plan: General   Post-op Pain Management: Tylenol PO (pre-op)*   Induction: Intravenous  PONV Risk Score and Plan: 4 or greater and Ondansetron, Dexamethasone, Midazolam and Treatment may vary due to age or medical condition  Airway Management Planned: Oral ETT  Additional Equipment: None  Intra-op Plan:   Post-operative Plan: Extubation in OR  Informed Consent: I have reviewed the patients History and Physical, chart, labs and discussed the procedure including the risks, benefits and alternatives for the proposed anesthesia with the patient or authorized representative who has indicated his/her understanding  and acceptance.     Dental advisory given  Plan Discussed with: CRNA  Anesthesia Plan Comments:        Anesthesia Quick Evaluation

## 2021-09-18 ENCOUNTER — Encounter (HOSPITAL_COMMUNITY): Payer: Self-pay | Admitting: Otolaryngology

## 2021-09-19 ENCOUNTER — Other Ambulatory Visit: Payer: Self-pay

## 2021-10-04 ENCOUNTER — Encounter: Payer: Self-pay | Admitting: *Deleted

## 2021-10-12 LAB — HM DIABETES EYE EXAM

## 2021-10-13 ENCOUNTER — Encounter: Payer: Self-pay | Admitting: Physician Assistant

## 2021-12-23 ENCOUNTER — Encounter: Payer: Self-pay | Admitting: *Deleted

## 2022-02-01 ENCOUNTER — Ambulatory Visit
Admission: RE | Admit: 2022-02-01 | Discharge: 2022-02-01 | Disposition: A | Discharge status: Home / Self Care | Payer: Commercial Managed Care - PPO | Source: Ambulatory Visit | Attending: Physician Assistant | Admitting: Physician Assistant

## 2022-02-01 DIAGNOSIS — D3A8 Other benign neuroendocrine tumors: Secondary | ICD-10-CM

## 2022-02-01 DIAGNOSIS — E119 Type 2 diabetes mellitus without complications: Secondary | ICD-10-CM

## 2022-02-01 DIAGNOSIS — Z780 Asymptomatic menopausal state: Secondary | ICD-10-CM

## 2022-02-01 DIAGNOSIS — Z1231 Encounter for screening mammogram for malignant neoplasm of breast: Secondary | ICD-10-CM

## 2022-05-10 ENCOUNTER — Telehealth: Payer: Commercial Managed Care - PPO | Admitting: Family Medicine

## 2022-05-10 ENCOUNTER — Encounter: Payer: Self-pay | Admitting: Physician Assistant

## 2022-05-10 DIAGNOSIS — H019 Unspecified inflammation of eyelid: Secondary | ICD-10-CM | POA: Diagnosis not present

## 2022-05-10 MED ORDER — BACITRACIN-POLYMYXIN B 500-10000 UNIT/GM OP OINT: 1.0000 | TOPICAL_OINTMENT | Freq: Two times a day (BID) | OPHTHALMIC | 0 refills | Status: DC

## 2022-05-10 NOTE — Progress Notes (Signed)
Virtual Visit Consent   [name] [name], you are scheduled for a virtual visit with a [facility] provider today. Just as with appointments in the office, your consent must be obtained to participate. Your consent will be active for this visit and any virtual visit you may have with one of our providers in the next 365 days. If you have a MyChart account, a copy of this consent can be sent to you electronically.  As this is a virtual visit, video technology does not allow for your provider to perform a traditional examination. This may limit your provider's ability to fully assess your condition. If your provider identifies any concerns that need to be evaluated in person or the need to arrange testing (such as labs, EKG, etc.), we will make arrangements to do so. Although advances in technology are sophisticated, we cannot ensure that it will always work on either your end or our end. If the connection with a video visit is poor, the visit may have to be switched to a telephone visit. With either a video or telephone visit, we are not always able to ensure that we have a secure connection.  By engaging in this virtual visit, you consent to the provision of healthcare and authorize for your insurance to be billed (if applicable) for the services provided during this visit. Depending on your insurance coverage, you may receive a charge related to this service.  I need to obtain your verbal consent now. Are you willing to proceed with your visit today? [name] [name] has provided verbal consent on 05/10/2022 for a virtual visit (video or telephone). Freddy Finner, NP  Date: 05/10/2022 3:18 PM  Virtual Visit via Video Note   I, Freddy Finner, connected with  [name] [name]  ([id number], Nov 14, 1965) on 05/10/22 at  3:15 PM EDT by a video-enabled telemedicine application and verified that I am speaking with the correct person using two identifiers.  Location: Patient: Virtual Visit Location Patient:  Home Provider: Virtual Visit Location Provider: Home Office   I discussed the limitations of evaluation and management by telemedicine and the availability of in person appointments. The patient expressed understanding and agreed to proceed.    History of Present Illness: [name] [name] is a 57 y.o. who identifies as a female who was assigned female at birth, and is being seen today for stye  Onset was 7 days ago it came up  Associated symptoms are tenderness to the area, redness, little to scant drainage usually in the morning Modifying factors are tried compresses, avoiding rubbing, keep it clean Denies chest pain, shortness of breath, fevers, chills, pain with eye movement, headache or URI symptoms  Of note she is on anti rejection meds  Exposure to sick contacts- unknown   Problems:  Patient Active Problem List   Diagnosis Date Noted   Acute lower gastrointestinal bleeding 06/27/2019   Obesity, Class III, BMI 40-49.9 (morbid obesity) (HCC) 06/27/2019   H/O right hemicolectomy 06/27/2019   GI bleed 06/27/2019   Acute GI bleeding 06/26/2019   PTDM (post-transplant diabetes mellitus) (HCC) 06/07/2018   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   Polycystic kidney disease 04/19/2017   Diabetes mellitus type 2, insulin dependent (HCC) 04/19/2017   Kidney replaced by transplant 09/23/2011   Immunosuppressive management encounter following kidney transplant 09/23/2011   Psoriasis 09/23/2011    Allergies:  Allergies  Allergen Reactions   Tape Rash and Other (See Comments)    Tears the skin- please  use an alternative   Vancomycin Hives, Itching, Other (See Comments) and Rash    Received via IV   Medications:  Current Outpatient Medications:    acetaminophen (TYLENOL) 325 MG tablet, Take 2 tablets (650 mg total) by mouth daily as needed for mild pain or headache., Disp: 30 tablet, Rfl: 0   albuterol (VENTOLIN HFA) 108 (90 Base) MCG/ACT inhaler, Inhale 1-2  puffs into the lungs every 6 (six) hours as needed for wheezing or shortness of breath., Disp: 1 each, Rfl: 0   atorvastatin (LIPITOR) 40 MG tablet, Take 40 mg by mouth at bedtime. , Disp: , Rfl:    cholecalciferol (VITAMIN D3) 25 MCG (1000 UNIT) tablet, Take 1,000 Units by mouth 2 (two) times daily., Disp: , Rfl:    CONTOUR NEXT TEST test strip, 3 (three) times daily. , Disp: , Rfl:    Insulin Pen Needle 31G X 5 MM MISC, USE AS DIRECTED ONCE DAILY, Disp: , Rfl:    MYFORTIC 360 MG TBEC EC tablet, Take 360 mg by mouth 2 (two) times daily., Disp: , Rfl:    NOVOLOG FLEXPEN 100 UNIT/ML FlexPen, Inject 8-16 Units into the skin 3 (three) times daily before meals., Disp: , Rfl:    PROGRAF 1 MG capsule, Take 1 mg by mouth 2 (two) times daily., Disp: , Rfl:    sodium bicarbonate 650 MG tablet, Take 650 mg by mouth daily., Disp: , Rfl:    TRESIBA FLEXTOUCH 200 UNIT/ML FlexTouch Pen, Inject 30 Units into the skin at bedtime., Disp: , Rfl:   Observations/Objective: Patient is well-developed, well-nourished in no acute distress.  Resting comfortably  at home.  Head is normocephalic, atraumatic.  No labored breathing.  Speech is clear and coherent with logical content.  Patient is alert and oriented at baseline.  Noted red and swollen area on lower right lid Eye movement intact   Assessment and Plan:  1. Infected eye lid  - bacitracin-polymyxin b (POLYSPORIN) ophthalmic ointment; Place 1 Application into the right eye every 12 (twelve) hours. apply to eye every 12 hours while awake  Dispense: 3.5 g; Refill: 0   -warm compresses -avoid rubbing -glasses no contacts Use Rx as discussed -low threshold for changes in symptoms for in person eval  Reviewed side effects, risks and benefits of medication.    Patient acknowledged agreement and understanding of the plan.   Past Medical, Surgical, Social History, Allergies, and Medications have been Reviewed.    Follow Up Instructions: I discussed  the assessment and treatment plan with the patient. The patient was provided an opportunity to ask questions and all were answered. The patient agreed with the plan and demonstrated an understanding of the instructions.  A copy of instructions were sent to the patient via MyChart unless otherwise noted below.   The patient was advised to call back or seek an in-person evaluation if the symptoms worsen or if the condition fails to improve as anticipated.  Time:  I spent 10 minutes with the patient via telehealth technology discussing the above problems/concerns.    Freddy Finner, NP

## 2022-05-10 NOTE — Telephone Encounter (Signed)
Please see pt medication and advise if we should schedule an appt

## 2022-05-10 NOTE — Patient Instructions (Addendum)
[name] [name], thank you for joining Freddy Finner, NP for today's virtual visit.  While this provider is not your primary care provider (PCP), if your PCP is located in our provider database this encounter information will be shared with them immediately following your visit.   A [facility] MyChart account gives you access to today's visit and all your visits, tests, and labs performed at [facility] " click here if you don't have a [facility] MyChart account or go to [email or web address]  Consent: (Patient) [name] [name] provided verbal consent for this virtual visit at the beginning of the encounter.  Current Medications:  Current Outpatient Medications:    bacitracin-polymyxin b (POLYSPORIN) ophthalmic ointment, Place 1 Application into the right eye every 12 (twelve) hours. apply to eye every 12 hours while awake, Disp: 3.5 g, Rfl: 0   acetaminophen (TYLENOL) 325 MG tablet, Take 2 tablets (650 mg total) by mouth daily as needed for mild pain or headache., Disp: 30 tablet, Rfl: 0   albuterol (VENTOLIN HFA) 108 (90 Base) MCG/ACT inhaler, Inhale 1-2 puffs into the lungs every 6 (six) hours as needed for wheezing or shortness of breath., Disp: 1 each, Rfl: 0   atorvastatin (LIPITOR) 40 MG tablet, Take 40 mg by mouth at bedtime. , Disp: , Rfl:    cholecalciferol (VITAMIN D3) 25 MCG (1000 UNIT) tablet, Take 1,000 Units by mouth 2 (two) times daily., Disp: , Rfl:    CONTOUR NEXT TEST test strip, 3 (three) times daily. , Disp: , Rfl:    Insulin Pen Needle 31G X 5 MM MISC, USE AS DIRECTED ONCE DAILY, Disp: , Rfl:    MYFORTIC 360 MG TBEC EC tablet, Take 360 mg by mouth 2 (two) times daily., Disp: , Rfl:    NOVOLOG FLEXPEN 100 UNIT/ML FlexPen, Inject 8-16 Units into the skin 3 (three) times daily before meals., Disp: , Rfl:    PROGRAF 1 MG capsule, Take 1 mg by mouth 2 (two) times daily., Disp: , Rfl:    sodium bicarbonate 650 MG tablet, Take 650 mg by mouth daily., Disp: ,  Rfl:    TRESIBA FLEXTOUCH 200 UNIT/ML FlexTouch Pen, Inject 30 Units into the skin at bedtime., Disp: , Rfl:    Medications ordered in this encounter:  Meds ordered this encounter  Medications   bacitracin-polymyxin b (POLYSPORIN) ophthalmic ointment    Sig: Place 1 Application into the right eye every 12 (twelve) hours. apply to eye every 12 hours while awake    Dispense:  3.5 g    Refill:  0    Order Specific Question:   Supervising Provider    Answer:   Merrilee Jansky [[id number]]     *If you need refills on other medications prior to your next appointment, please contact your pharmacy*  Follow-Up: Call back or seek an in-person evaluation if the symptoms worsen or if the condition fails to improve as anticipated.  [facility] [phone]  Other Instructions  -warm compresses -avoid rubbing -glasses no contacts Use Rx as discussed -low threshold for changes in symptoms for in person eval  If you have been instructed to have an in-person evaluation today at a local Urgent Care facility, please use the link below. It will take you to a list of all of our available [facility], including address, phone number and hours of operation. Please do not delay care.  [facility]  If you or a family member do  not have a primary care provider, use the link below to schedule a visit and establish care. When you choose a [facility] primary care physician or advanced practice provider, you gain a long-term partner in health. Find a Primary Care Provider  Learn more about [facility] in-office and virtual care options: [facility]

## 2022-08-01 ENCOUNTER — Ambulatory Visit (INDEPENDENT_AMBULATORY_CARE_PROVIDER_SITE_OTHER): Payer: Commercial Managed Care - PPO | Admitting: Physician Assistant

## 2022-08-01 ENCOUNTER — Encounter: Payer: Commercial Managed Care - PPO | Admitting: Physician Assistant

## 2022-08-01 VITALS — BP 134/89 | HR 73 | Temp 98.0°F | Ht 66.0 in | Wt 275.0 lb

## 2022-08-01 DIAGNOSIS — E119 Type 2 diabetes mellitus without complications: Secondary | ICD-10-CM

## 2022-08-01 DIAGNOSIS — Z0000 Encounter for general adult medical examination without abnormal findings: Secondary | ICD-10-CM

## 2022-08-01 DIAGNOSIS — Z794 Long term (current) use of insulin: Secondary | ICD-10-CM

## 2022-08-01 DIAGNOSIS — H00022 Hordeolum internum right lower eyelid: Secondary | ICD-10-CM | POA: Diagnosis not present

## 2022-08-01 DIAGNOSIS — Z1159 Encounter for screening for other viral diseases: Secondary | ICD-10-CM | POA: Diagnosis not present

## 2022-08-01 NOTE — Progress Notes (Signed)
Subjective:    Patient ID: [name] [name], female    DOB: Aug 04, 1965, 57 y.o.   MRN: [id number]  Chief Complaint  Patient presents with   Annual Exam    Fasting w/ labs    HPI Patient with hx of PCKD, neuroendocrine carcinoma of sm intestine, T2DM, transplanted kidney, is in today for annual exam.  She has regular labs done between her specialists.   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) Nutrition: Water and lemonade; overall healthy diet Mental health: Doing well  Caffeine: None whatsoever  Sleep: Doing well for the most part Substance use: None ETOH: None  Sexual activity: Monogamous with husband, no concerns  Immunizations: Will ask nephrology about Shingrix Colonoscopy: UTD Pap: Hysterectomy hx; no issues going on Mammogram: UTD DEXA: Had one about 5 years ago, no problems; takes Vit D and eats plenty of dairy     Past Medical History:  Diagnosis Date   Arthritis    Chronic kidney disease    Diabetes mellitus without complication (HCC)    Dyspnea    Eczema    Enlarged kidney    bilateral   Gout    previously   History of chicken pox    History of recurrent UTIs    Hyperlipidemia    reports doesnt have elevated lipids, but takes due to hx of kidney transplant    Hypertension    reports BP normalized after transplant   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    Polycystic kidney disease    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[facility])    Dx Nov 2019; May 2020   Psoriasis     Past Surgical History:  Procedure  Laterality Date   ABDOMINAL HYSTERECTOMY  2000?   complete   BIOPSY  12/05/2017   Procedure: BIOPSY;  Surgeon: Kerin Salen, MD;  Location: [facility];  Service: Gastroenterology;;   CATARACT EXTRACTION  01/10/2009   bilateral   CESAREAN SECTION  1995 and 1997   COLON RESECTION  05/2018   Ileum, appendix, part of small int and colon; 17 lymph nodes removed, one had cacner   COLONOSCOPY N/A 12/05/2017   Procedure: COLONOSCOPY;  Surgeon: Kerin Salen, MD;  Location: [facility];  Service: Gastroenterology;  Laterality: N/A;   HERNIA REPAIR  01/11/2003   umbilical hernia   HERNIA REPAIR  01/11/2011   NASAL SEPTOPLASTY W/ TURBINOPLASTY Bilateral 09/17/2021   Procedure: NASAL SEPTOPLASTY WITH TURBINATE REDUCTION;  Surgeon: Newman Pies, MD;  Location: [facility] OR;  Service: ENT;  Laterality: Bilateral;   NEPHRECTOMY TRANSPLANTED ORGAN     PERITONEAL CATHETER INSERTION     VENTRAL HERNIA REPAIR  07/04/2011   Procedure: HERNIA REPAIR VENTRAL ADULT;  Surgeon: Wilmon Arms. Corliss Skains, MD;  Location: [facility] OR;  Service: General;  Laterality: N/A;    Family History  Problem Relation Age of Onset   Arthritis Mother    Hypertension Father    Polycystic kidney disease Father        died before starting dialysis was near ESRD   Cerebral aneurysm Father  died age 53   Stroke Father    Arthritis Father    Cancer Sister 59       cancer that developed in the fatty tissues of the muscle   Kidney disease Brother    Hypertension Brother    Polycystic kidney disease Brother        transplant  7 years ago   Cancer Maternal Grandfather    Cancer Paternal Grandmother    Arthritis Paternal Grandmother    Cancer Paternal Grandfather    Hearing loss Paternal Grandfather     Social History   Tobacco Use   Smoking status: Never   Smokeless tobacco: Never  Vaping Use   Vaping status: Never Used  Substance Use Topics   Alcohol use: No   Drug use: No     Allergies  Allergen Reactions   Tape Rash and Other  (See Comments)    Tears the skin- please use an alternative   Vancomycin Hives, Itching, Other (See Comments) and Rash    Received via IV    Review of Systems NEGATIVE UNLESS OTHERWISE INDICATED IN HPI      Objective:     BP (!) 137/90 (BP Location: Right Arm, Patient Position: Sitting, Cuff Size: Large)   Pulse 73   Temp 98 F (36.7 C) (Temporal)   Ht 5\' 6"  (1.676 m)   Wt 275 lb (124.7 kg)   LMP  (LMP Unknown)   SpO2 96%   BMI 44.39 kg/m   Wt Readings from Last 3 Encounters:  08/01/22 275 lb (124.7 kg)  09/17/21 286 lb (129.7 kg)  07/29/21 287 lb 3.2 oz (130.3 kg)    BP Readings from Last 3 Encounters:  08/01/22 (!) 137/90  09/17/21 133/63  07/29/21 127/85     Physical Exam Vitals and nursing note reviewed.  Constitutional:      Appearance: Normal appearance. She is obese. She is not toxic-appearing.  HENT:     Head: Normocephalic and atraumatic.     Right Ear: Tympanic membrane, ear canal and external ear normal.     Left Ear: Tympanic membrane, ear canal and external ear normal.     Nose: Nose normal.     Mouth/Throat:     Mouth: Mucous membranes are moist.  Eyes:     Extraocular Movements: Extraocular movements intact.     Conjunctiva/sclera: Conjunctivae normal.     Pupils: Pupils are equal, round, and reactive to light.     Comments: Small stye R lower internal eyelid x several months per patient   Cardiovascular:     Rate and Rhythm: Normal rate and regular rhythm.     Pulses: Normal pulses.     Heart sounds: Normal heart sounds.  Pulmonary:     Effort: Pulmonary effort is normal.     Breath sounds: Normal breath sounds.  Abdominal:     General: Abdomen is flat. Bowel sounds are normal.     Palpations: Abdomen is soft.  Musculoskeletal:        General: Normal range of motion.     Cervical back: Normal range of motion and neck supple.  Skin:    General: Skin is warm and dry.     Findings: Lesion present. No rash.     Comments: Few seborrheic  dermatoses, cherry angiomas noted  Neurological:     General: No focal deficit present.     Mental Status: She is alert and oriented to person, place, and time.  Psychiatric:  Mood and Affect: Mood normal.        Behavior: Behavior normal.        Thought Content: Thought content normal.        Judgment: Judgment normal.        Assessment & Plan:   Problem List Items Addressed This Visit       Endocrine   Diabetes mellitus type 2, insulin dependent (HCC)   Relevant Medications   Semaglutide,0.25 or 0.5MG /DOS, (OZEMPIC, 0.25 OR 0.5 MG/DOSE,) 2 MG/3ML SOPN     Other   Annual physical exam - Primary   Other Visit Diagnoses     Need for hepatitis C screening test       Hordeolum internum of right lower eyelid           Plan: Age-appropriate screening and counseling performed today. Labs are frequently checked with her specialists and she is UTD. Stays UTD with health maintenance. Preventive measures discussed and printed in AVS for patient. She is doing well with nutrition and exercise.  Patient Counseling: [x]   Nutrition: Stressed importance of moderation in sodium/caffeine intake, saturated fat and cholesterol, caloric balance, sufficient intake of fresh fruits, vegetables, and fiber.  [x]   Stressed the importance of regular exercise.   []   Substance Abuse: Discussed cessation/primary prevention of tobacco, alcohol, or other drug use; driving or other dangerous activities under the influence; availability of treatment for abuse.   []   Injury prevention: Discussed safety belts, safety helmets, smoke detector, smoking near bedding or upholstery.   []   Sexuality: Discussed sexually transmitted diseases, partner selection, use of condoms, avoidance of unintended pregnancy  and contraceptive alternatives.   [x]   Dental health: Discussed importance of regular tooth brushing, flossing, and dental visits.  [x]   Health maintenance and immunizations reviewed. Please refer to  Health maintenance section.    She will connect with Earley Brooke about the ongoing stye in her eye.  Congratulated her on her health journey!     Return in about 6 months (around 02/01/2023) for physical.    Darryl Blumenstein M Shuna Tabor, PA-C

## 2022-10-18 LAB — HM DIABETES EYE EXAM

## 2022-10-18 NOTE — Progress Notes (Shared)
[name] [name] is a 57 y.o. female here for a new problem.  History of Present Illness:   No chief complaint on file.   HPI  Neck Pain Has been having pain located in the left/right mastoid area that radiates down her neck.   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   complete   BIOPSY  12/05/2017   Procedure: BIOPSY;  Surgeon: Kerin Salen, MD;  Location: [facility];  Service: Gastroenterology;;   CATARACT EXTRACTION  01/10/2009   bilateral   CESAREAN SECTION  1995 and 1997   COLON RESECTION  05/2018   Ileum, appendix, part of small int and colon; 17 lymph nodes removed, one had cacner   COLONOSCOPY N/A 12/05/2017   Procedure: COLONOSCOPY;  Surgeon: Kerin Salen, MD;  Location: [facility];  Service: Gastroenterology;  Laterality: N/A;   HERNIA REPAIR  01/11/2003   umbilical hernia   HERNIA REPAIR  01/11/2011   NASAL SEPTOPLASTY W/ TURBINOPLASTY Bilateral 09/17/2021   Procedure: NASAL SEPTOPLASTY WITH TURBINATE REDUCTION;  Surgeon: Newman Pies, MD;  Location: [facility];  Service: ENT;  Laterality: Bilateral;   NEPHRECTOMY  TRANSPLANTED ORGAN     PERITONEAL CATHETER INSERTION     VENTRAL HERNIA REPAIR  07/04/2011   Procedure: HERNIA REPAIR VENTRAL ADULT;  Surgeon: Wilmon Arms. Corliss Skains, MD;  Location: [facility];  Service: General;  Laterality: N/A;    Family History  Problem Relation Age of Onset   Arthritis Mother    Hypertension Father    Polycystic kidney disease Father        died before starting dialysis was near ESRD   Cerebral aneurysm Father        died age 46   Stroke Father    Arthritis Father    Cancer Sister 73       cancer that developed in the fatty tissues of the muscle   Kidney disease Brother    Hypertension Brother    Polycystic kidney disease Brother        transplant  7 years ago   Cancer Maternal Grandfather    Cancer Paternal Grandmother    Arthritis Paternal Grandmother    Cancer Paternal Grandfather    Hearing loss Paternal Grandfather     Allergies  Allergen Reactions   Tape Rash and Other (See Comments)    Tears the skin- please use an alternative   Vancomycin Hives, Itching, Other (See Comments)  and Rash    Received via IV    Current Medications:   Current Outpatient Medications:    acetaminophen (TYLENOL) 325 MG tablet, Take 2 tablets (650 mg total) by mouth daily as needed for mild pain or headache., Disp: 30 tablet, Rfl: 0   atorvastatin (LIPITOR) 40 MG tablet, Take 40 mg by mouth at bedtime. , Disp: , Rfl:    bacitracin-polymyxin b (POLYSPORIN) ophthalmic ointment, Place 1 Application into the right eye every 12 (twelve) hours. apply to eye every 12 hours while awake, Disp: 3.5 g, Rfl: 0   cholecalciferol (VITAMIN D3) 25 MCG (1000 UNIT) tablet, Take 1,000 Units by mouth 2 (two) times daily., Disp: , Rfl:    CONTOUR NEXT TEST test strip, 3 (three) times daily. , Disp: , Rfl:    Insulin Pen Needle 31G X 5 MM MISC, USE AS DIRECTED ONCE DAILY, Disp: , Rfl:    MYFORTIC 360 MG TBEC EC tablet, Take 360 mg by mouth 2 (two) times daily., Disp: , Rfl:    NOVOLOG FLEXPEN 100  UNIT/ML FlexPen, Inject 8-16 Units into the skin 3 (three) times daily before meals., Disp: , Rfl:    PROGRAF 1 MG capsule, Take 1 mg by mouth 2 (two) times daily., Disp: , Rfl:    Semaglutide,0.25 or 0.5MG /DOS, (OZEMPIC, 0.25 OR 0.5 MG/DOSE,) 2 MG/3ML SOPN, Inject 0.5 mg into the skin once a week., Disp: , Rfl:    TRESIBA FLEXTOUCH 200 UNIT/ML FlexTouch Pen, Inject 30 Units into the skin at bedtime., Disp: , Rfl:    Review of Systems:   ROS  Vitals:   There were no vitals filed for this visit.   There is no height or weight on file to calculate BMI.  Physical Exam:   Physical Exam  Assessment and Plan:   ***   I,Alexander Ruley,acting as a scribe for Jarold Motto, PA.,have documented all relevant documentation on the behalf of Jarold Motto, PA,as directed by  Jarold Motto, PA while in the presence of Jarold Motto, [place].   ***   Jarold Motto, PA-C

## 2022-10-19 ENCOUNTER — Ambulatory Visit (INDEPENDENT_AMBULATORY_CARE_PROVIDER_SITE_OTHER): Payer: Commercial Managed Care - PPO | Admitting: Nurse Practitioner

## 2022-10-19 ENCOUNTER — Encounter: Payer: Self-pay | Admitting: Nurse Practitioner

## 2022-10-19 VITALS — BP 140/80 | HR 69 | Temp 98.1°F | Ht 66.0 in | Wt 275.4 lb

## 2022-10-19 DIAGNOSIS — H9209 Otalgia, unspecified ear: Secondary | ICD-10-CM | POA: Insufficient documentation

## 2022-10-19 MED ORDER — DOXYCYCLINE HYCLATE 100 MG PO TABS: 100.0000 mg | ORAL_TABLET | Freq: Two times a day (BID) | ORAL | 0 refills | Status: DC

## 2022-10-19 MED ORDER — METHOCARBAMOL 500 MG PO TABS: 500.0000 mg | ORAL_TABLET | Freq: Three times a day (TID) | ORAL | 0 refills | Status: DC | PRN

## 2022-10-19 NOTE — Progress Notes (Addendum)
Established Patient Office Visit  Subjective   Patient ID: [name] [name], female    DOB: 1965/07/22  Age: 57 y.o. MRN: [id number]  Chief Complaint  Patient presents with   Ear Pain    Pt c/o left ear pain that radiates down to her neck that started a week ago.    Patient arrives today for the above. She is a 57 y/o female with past medical history positive for neuroendocrine neoplasm of the GI tract, s/p kidney transplant, Type 2 diabetes, psoriasis, polycystic kidney disease, obesity, arthritis, eczema, hyperlipidemia, and hypertension.   Her symptoms started about 10 days ago. Has pain and tenderness to left ear. Reports it is a dull, achy pain with "needle-like" pain upon palpation. No recent URI, no ear drainage, no fever, no hearing loss. She reports that she feels fatigued and which she will feel if she is fighting an infection. Unable to take ibuprofen, but has been taking tylenol as needed without much improvement in the pain. Has not noticed increased clenching of the jaw recently, also reports recent visit to the dentist.    Review of Systems  Constitutional:  Positive for malaise/fatigue. Negative for chills and fever.  HENT:  Positive for ear pain. Negative for congestion, ear discharge, hearing loss, sinus pain and tinnitus.   Respiratory:  Negative for cough.       Objective:     BP (!) 140/80   Pulse 69   Temp 98.1 F (36.7 C)   Ht 5\' 6"  (1.676 m)   Wt 275 lb 6.4 oz (124.9 kg)   LMP  (LMP Unknown)   SpO2 97%   BMI 44.45 kg/m    Physical Exam Vitals reviewed.  Constitutional:      General: She is not in acute distress.    Appearance: Normal appearance.  HENT:     Head: Normocephalic and atraumatic.     Right Ear: Hearing, tympanic membrane, ear canal and external ear normal.     Left Ear: Hearing, tympanic membrane, ear canal and external ear normal.     Ears:     Comments: Significant tenderness to ear helix and preauricular soft tissue and  postauricular soft tissue. No swelling, redness, masses noted.  Neck:     Vascular: No carotid bruit.  Cardiovascular:     Rate and Rhythm: Normal rate and regular rhythm.     Pulses: Normal pulses.     Heart sounds: Normal heart sounds.  Pulmonary:     Effort: Pulmonary effort is normal.     Breath sounds: Normal breath sounds.  Lymphadenopathy:     Cervical: No cervical adenopathy.  Skin:    General: Skin is warm and dry.  Neurological:     General: No focal deficit present.     Mental Status: She is alert and oriented to person, place, and time.  Psychiatric:        Mood and Affect: Mood normal.        Behavior: Behavior normal.        Judgment: Judgment normal.      No results found for any visits on 10/19/22.    The ASCVD Risk score (Arnett DK, et al., 2019) failed to calculate for the following reasons:   Cannot find a previous HDL lab   Cannot find a previous total cholesterol lab    Assessment & Plan:   Problem List Items Addressed This Visit       Other   Otalgia - Primary  Acute Etiology unclear, she is quite tender on exam. Discussed case with Dr. Mardelle Matte here at the office and she recommend considering trial of tylenol and robaxin for TMJ vs trigeminal neuralgia as no clinical signs on exam outside of tenderness noted for infection.  Initially recommended treating with course of doxycycline, but after discussion with Dr. Mardelle Matte I have changed recommendation to treat with tylenol and robaxin 500mg  every 8 hours PRN pain. Pharmacy was called to cancel the doxycycline. Patient warned not to drive or operate heavy machinery while taking the robaxin.  Patient encouraged to call her kidney transplant/nephrologist team to discuss if robaxin is safe to take in regards to possible interaction with myfortic and prograf. No interaction noted via my resources. She reports her understanding and states she will call them.  Will also refer to ENT for assistance with  evaluation/management if pain persists and encouraged patient to follow-up with her dentist.       Relevant Medications   methocarbamol (ROBAXIN) 500 MG tablet   Other Relevant Orders   Ambulatory referral to ENT    Return if symptoms worsen or fail to improve.    Elenore Paddy, NP

## 2022-10-19 NOTE — Progress Notes (Signed)
This encounter was created in error - please disregard.

## 2022-10-19 NOTE — Assessment & Plan Note (Addendum)
Acute Etiology unclear, she is quite tender on exam. Discussed case with Dr. Mardelle Matte here at the office and she recommend considering trial of tylenol and robaxin for TMJ vs trigeminal neuralgia as no clinical signs on exam outside of tenderness noted for infection.  Initially recommended treating with course of doxycycline, but after discussion with Dr. Mardelle Matte I have changed recommendation to treat with tylenol and robaxin 500mg  every 8 hours PRN pain. Pharmacy was called to cancel the doxycycline. Patient warned not to drive or operate heavy machinery while taking the robaxin.  Patient encouraged to call her kidney transplant/nephrologist team to discuss if robaxin is safe to take in regards to possible interaction with myfortic and prograf. No interaction noted via my resources. She reports her understanding and states she will call them.  Will also refer to ENT for assistance with evaluation/management if pain persists and encouraged patient to follow-up with her dentist.

## 2022-10-19 NOTE — Addendum Note (Signed)
Addended by: Jiles Prows E on: 10/19/2022 02:47 PM   Modules accepted: Orders

## 2022-10-31 ENCOUNTER — Encounter (INDEPENDENT_AMBULATORY_CARE_PROVIDER_SITE_OTHER): Payer: Self-pay | Admitting: Otolaryngology

## 2022-11-01 ENCOUNTER — Ambulatory Visit: Payer: Commercial Managed Care - PPO | Admitting: Physician Assistant

## 2022-12-14 ENCOUNTER — Other Ambulatory Visit: Payer: Self-pay | Admitting: Nephrology

## 2022-12-14 DIAGNOSIS — Z8249 Family history of ischemic heart disease and other diseases of the circulatory system: Secondary | ICD-10-CM

## 2023-02-15 LAB — MICROALBUMIN, URINE: Microalb, Ur: 4.4

## 2023-02-15 LAB — PROTEIN / CREATININE RATIO, URINE: Creatinine, Urine: 93.6

## 2023-02-15 LAB — MICROALBUMIN / CREATININE URINE RATIO: Microalb Creat Ratio: 5

## 2023-02-16 ENCOUNTER — Ambulatory Visit: Payer: Commercial Managed Care - PPO

## 2023-02-16 ENCOUNTER — Other Ambulatory Visit: Payer: Self-pay | Admitting: Physician Assistant

## 2023-02-16 ENCOUNTER — Ambulatory Visit: Payer: Commercial Managed Care - PPO | Admitting: Physician Assistant

## 2023-02-16 VITALS — BP 136/86 | HR 82 | Temp 97.7°F | Ht 66.0 in | Wt 278.4 lb

## 2023-02-16 DIAGNOSIS — G8929 Other chronic pain: Secondary | ICD-10-CM

## 2023-02-16 DIAGNOSIS — M25512 Pain in left shoulder: Secondary | ICD-10-CM | POA: Diagnosis not present

## 2023-02-16 DIAGNOSIS — M1991 Primary osteoarthritis, unspecified site: Secondary | ICD-10-CM | POA: Insufficient documentation

## 2023-02-16 DIAGNOSIS — M170 Bilateral primary osteoarthritis of knee: Secondary | ICD-10-CM | POA: Diagnosis not present

## 2023-02-16 DIAGNOSIS — M25569 Pain in unspecified knee: Secondary | ICD-10-CM | POA: Insufficient documentation

## 2023-02-16 DIAGNOSIS — Z1231 Encounter for screening mammogram for malignant neoplasm of breast: Secondary | ICD-10-CM

## 2023-02-16 NOTE — Progress Notes (Signed)
 Patient ID: Abigael Mogle, female    DOB: 1965/09/27, 58 y.o.   MRN: [id number]   Assessment & Plan:  Chronic left shoulder pain -     DG Shoulder Left; Future  Primary osteoarthritis of both knees   Assessment and Plan    Left Shoulder Pain Pain for several months, exacerbated by movement and physical therapy. Suspected AC joint arthritis or possible rotator cuff involvement. No known injury or prior surgeries. Pain interferes with sleep. -Order left shoulder X-ray today. -Consider use of Salonpas patches or [place] gel for topical pain relief. -Consider referral to orthopedics or sports medicine for possible ultrasound-guided injection if AC joint arthritis is confirmed.  Osteoarthritis (Knees) Severe osteoarthritis in both knees, right worse than left. Currently undergoing physical therapy. -Continue physical therapy. -Consider aquatic therapy if feasible and safe given patient's post-transplant status. -Discuss potential for future injections or surgery with rheumatologist.  General Health Maintenance -Upcoming mammogram scheduled for next week.         Subjective:    Chief Complaint  Patient presents with   Shoulder Pain    Pt in office for shoulder concerns; pt saw provider in [place]  and advised ok to see Rheumatologist and was dx with arthritis; saw PT and confirmed it isn't arthritis; Pt states PT is afraid to work it in fear of making things worse without further evaluation.    Shoulder Pain     History of Present Illness   Ginni Eichler is a 58 year old female with osteoarthritis who presents with left shoulder pain.  She has been experiencing persistent left shoulder pain for several months, initially attributing it to her osteoarthritis. The pain is located in the anterior part of the shoulder and worsens with movement, particularly when lifting her arm. It is exacerbated by physical therapy exercises and disrupts her sleep, causing discomfort at  night. She denies any specific injury or trauma to the area and has not had any imaging studies done on the shoulder.  She has been undergoing physical therapy for several weeks, initially recommended by her rheumatologist for knee issues. While beneficial for her knees, the therapy has highlighted dysfunction in her shoulder. The physical therapist noted a knot in the shoulder area and suggested there might be more than just arthritis involved.  Her current medication regimen includes arthritis Tylenol , which she finds helpful for her general arthritis symptoms but not specifically for the shoulder pain. She has not tried any topical treatments like creams or patches for the shoulder pain.  No recent falls or injuries to the shoulder. The shoulder pain disrupts her sleep. No breast changes noted. No weakness or dropping of objects from the hand.        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   complete   BIOPSY  12/05/2017   Procedure: BIOPSY;  Surgeon: Saintclair Jasper, MD;  [facility]: [facility];  Service: Gastroenterology;;   CATARACT EXTRACTION  01/10/2009   bilateral   CESAREAN SECTION  1995 and 1997   COLON RESECTION  05/2018   Ileum, appendix, part of small int and colon; 17 lymph nodes removed, one had cacner    COLONOSCOPY N/A 12/05/2017   Procedure: COLONOSCOPY;  Surgeon: Saintclair Jasper, MD;  Location: [facility];  Service: Gastroenterology;  Laterality: N/A;   HERNIA REPAIR  01/11/2003   umbilical hernia   HERNIA REPAIR  01/11/2011   NASAL SEPTOPLASTY W/ TURBINOPLASTY Bilateral 09/17/2021   Procedure: NASAL SEPTOPLASTY WITH TURBINATE REDUCTION;  Surgeon: Karis Clunes, MD;  Location: [facility];  Service: ENT;  Laterality: Bilateral;   NEPHRECTOMY TRANSPLANTED ORGAN     PERITONEAL CATHETER INSERTION     VENTRAL HERNIA REPAIR  07/04/2011   Procedure: HERNIA REPAIR VENTRAL ADULT;  Surgeon: Donnice POUR. Tsuei, MD;  Location: [facility];  Service: General;  Laterality: N/A;    Family History  Problem Relation Age of Onset   Arthritis Mother    Hypertension Father    Polycystic kidney disease Father        died before starting dialysis was near ESRD   Cerebral aneurysm Father        died age 58   Stroke Father    Arthritis Father    Cancer Sister 13       cancer that developed in the fatty tissues of the muscle   Kidney disease Brother    Hypertension Brother    Polycystic kidney disease Brother        transplant  7 years ago   Cancer Maternal Grandfather    Cancer Paternal Grandmother    Arthritis Paternal Grandmother    Cancer Paternal Grandfather    Hearing loss Paternal Grandfather     Social History   Tobacco Use   Smoking status: Never   Smokeless tobacco: Never  Vaping Use   Vaping status: Never Used  Substance Use Topics   Alcohol use: No   Drug use: No     Allergies  Allergen Reactions   Tape Rash and Other (See Comments)    Tears the skin- please use an alternative   Vancomycin  Hives, Itching, Other (See Comments) and Rash    Received via IV    Review of Systems NEGATIVE UNLESS OTHERWISE INDICATED IN HPI      Objective:     BP 136/86 (BP Location: Left Arm, Patient Position: Sitting, Cuff Size: Large)   Pulse 82   Temp 97.7 F (36.5 C) (Temporal)   Ht 5' 6 (1.676 m)    Wt 278 lb 6.4 oz (126.3 kg)   LMP  (LMP Unknown)   SpO2 97%   BMI 44.93 kg/m   Wt Readings from Last 3 Encounters:  02/16/23 278 lb 6.4 oz (126.3 kg)  10/19/22 275 lb 6.4 oz (124.9 kg)  08/01/22 275 lb (124.7 kg)    BP Readings from Last 3 Encounters:  02/16/23 136/86  10/19/22 (!) 140/80  08/01/22 134/89     Physical Exam Vitals and nursing note reviewed.  Constitutional:      Appearance: Normal appearance. She is obese.  Musculoskeletal:     Right shoulder: Tenderness (Left AC tenderness; diffuse anterior shoulder tender) present. No swelling, deformity  or crepitus. Decreased range of motion. Decreased strength (slight dec grip strength). Normal pulse.  Skin:    Findings: No erythema, lesion or rash.  Neurological:     General: No focal deficit present.     Mental Status: She is alert and oriented to person, place, and time.     Sensory: No sensory deficit.  Psychiatric:        Mood and Affect: Mood normal.        Barrie Wale M Damire Remedios, PA-C

## 2023-02-23 ENCOUNTER — Ambulatory Visit
Admission: RE | Admit: 2023-02-23 | Discharge: 2023-02-23 | Disposition: A | Discharge status: Home / Self Care | Payer: Commercial Managed Care - PPO | Source: Ambulatory Visit

## 2023-02-23 DIAGNOSIS — Z1231 Encounter for screening mammogram for malignant neoplasm of breast: Secondary | ICD-10-CM

## 2023-02-24 ENCOUNTER — Encounter: Payer: Self-pay | Admitting: Physician Assistant

## 2023-02-27 ENCOUNTER — Encounter: Payer: Self-pay | Admitting: Physician Assistant

## 2023-02-28 NOTE — Telephone Encounter (Signed)
 Please see pt response and advise

## 2023-03-01 ENCOUNTER — Other Ambulatory Visit: Payer: Self-pay | Admitting: Physician Assistant

## 2023-03-01 DIAGNOSIS — M25612 Stiffness of left shoulder, not elsewhere classified: Secondary | ICD-10-CM

## 2023-03-01 DIAGNOSIS — G8929 Other chronic pain: Secondary | ICD-10-CM

## 2023-03-18 ENCOUNTER — Ambulatory Visit
Admission: RE | Admit: 2023-03-18 | Discharge: 2023-03-18 | Disposition: A | Discharge status: Home / Self Care | Payer: Commercial Managed Care - PPO | Source: Ambulatory Visit | Attending: Physician Assistant | Admitting: Physician Assistant

## 2023-03-18 DIAGNOSIS — M25612 Stiffness of left shoulder, not elsewhere classified: Secondary | ICD-10-CM

## 2023-03-18 DIAGNOSIS — G8929 Other chronic pain: Secondary | ICD-10-CM

## 2023-03-22 ENCOUNTER — Other Ambulatory Visit: Payer: Self-pay | Admitting: Nephrology

## 2023-03-22 DIAGNOSIS — Z8249 Family history of ischemic heart disease and other diseases of the circulatory system: Secondary | ICD-10-CM

## 2023-03-28 ENCOUNTER — Encounter: Payer: Self-pay | Admitting: Physician Assistant

## 2023-03-28 ENCOUNTER — Other Ambulatory Visit: Payer: Self-pay

## 2023-03-28 DIAGNOSIS — R936 Abnormal findings on diagnostic imaging of limbs: Secondary | ICD-10-CM

## 2023-03-28 DIAGNOSIS — G8929 Other chronic pain: Secondary | ICD-10-CM

## 2023-03-28 NOTE — Telephone Encounter (Signed)
 Pt advise no Ortho provider at this time; please send to your recommended office. Please advise so referral can be placed, thanks

## 2023-04-08 ENCOUNTER — Ambulatory Visit
Admission: RE | Admit: 2023-04-08 | Discharge: 2023-04-08 | Disposition: A | Discharge status: Home / Self Care | Source: Ambulatory Visit | Attending: Nephrology | Admitting: Nephrology

## 2023-04-08 DIAGNOSIS — Z8249 Family history of ischemic heart disease and other diseases of the circulatory system: Secondary | ICD-10-CM

## 2023-04-11 DIAGNOSIS — M75110 Incomplete rotator cuff tear or rupture of unspecified shoulder, not specified as traumatic: Secondary | ICD-10-CM | POA: Insufficient documentation

## 2023-04-21 ENCOUNTER — Telehealth: Admitting: Physician Assistant

## 2023-04-21 DIAGNOSIS — J208 Acute bronchitis due to other specified organisms: Secondary | ICD-10-CM | POA: Diagnosis not present

## 2023-04-21 DIAGNOSIS — B9689 Other specified bacterial agents as the cause of diseases classified elsewhere: Secondary | ICD-10-CM

## 2023-04-21 MED ORDER — BENZONATATE 100 MG PO CAPS: 100.0000 mg | ORAL_CAPSULE | Freq: Three times a day (TID) | ORAL | 0 refills | Status: DC | PRN

## 2023-04-21 MED ORDER — DOXYCYCLINE HYCLATE 100 MG PO TABS: 100.0000 mg | ORAL_TABLET | Freq: Two times a day (BID) | ORAL | 0 refills | Status: DC

## 2023-04-21 NOTE — Progress Notes (Signed)
 E-Visit for Cough   We are sorry that you are not feeling well.  Here is how we plan to help!  Based on your presentation I believe you most likely have A cough due to bacteria.  When patients have a fever and a productive cough with a change in color or increased sputum production, we are concerned about bacterial bronchitis.  If left untreated it can progress to pneumonia.  If your symptoms do not improve with your treatment plan it is important that you contact your provider.   I have prescribed Doxycycline 100 mg twice a day for 7 days     In addition you may use A non-prescription cough medication called Mucinex DM: take 2 tablets every 12 hours. and A prescription cough medication called Tessalon Perles 100mg . You may take 1-2 capsules every 8 hours as needed for your cough.  From your responses in the eVisit questionnaire you describe inflammation in the upper respiratory tract which is causing a significant cough.  This is commonly called Bronchitis and has four common causes:   Allergies Viral Infections Acid Reflux Bacterial Infection Allergies, viruses and acid reflux are treated by controlling symptoms or eliminating the cause. An example might be a cough caused by taking certain blood pressure medications. You stop the cough by changing the medication. Another example might be a cough caused by acid reflux. Controlling the reflux helps control the cough.     HOME CARE Only take medications as instructed by your medical team. Complete the entire course of an antibiotic. Drink plenty of fluids and get plenty of rest. Avoid close contacts especially the very young and the elderly Cover your mouth if you cough or cough into your sleeve. Always remember to wash your hands A steam or ultrasonic humidifier can help congestion.   GET HELP RIGHT AWAY IF: You develop worsening fever. You become short of breath You cough up blood. Your symptoms persist after you have completed your  treatment plan MAKE SURE YOU  Understand these instructions. Will watch your condition. Will get help right away if you are not doing well or get worse.    Thank you for choosing an e-visit.  Your e-visit answers were reviewed by a board certified advanced clinical practitioner to complete your personal care plan. Depending upon the condition, your plan could have included both over the counter or prescription medications.  Please review your pharmacy choice. Make sure the pharmacy is open so you can pick up prescription now. If there is a problem, you may contact your provider through [employer] and have the prescription routed to another pharmacy.  Your safety is important to [place]. If you have drug allergies check your prescription carefully.   For the next 24 hours you can use MyChart to ask questions about today's visit, request a non-urgent call back, or ask for a work or school excuse. You will get an email in the next two days asking about your experience. I hope that your e-visit has been valuable and will speed your recovery.   I have spent 5 minutes in review of e-visit questionnaire, review and updating patient chart, medical decision making and response to patient.   Margaretann Loveless, PA-C

## 2023-05-03 LAB — HEMOGLOBIN A1C: A1c: 7.3

## 2023-05-16 DIAGNOSIS — M25512 Pain in left shoulder: Secondary | ICD-10-CM | POA: Insufficient documentation

## 2023-06-22 ENCOUNTER — Ambulatory Visit (INDEPENDENT_AMBULATORY_CARE_PROVIDER_SITE_OTHER): Admitting: Physician Assistant

## 2023-06-22 ENCOUNTER — Encounter: Payer: Self-pay | Admitting: Physician Assistant

## 2023-06-22 VITALS — BP 130/84 | HR 79 | Temp 98.1°F | Ht 66.0 in | Wt 271.2 lb

## 2023-06-22 DIAGNOSIS — H00025 Hordeolum internum left lower eyelid: Secondary | ICD-10-CM

## 2023-06-22 MED ORDER — ERYTHROMYCIN 5 MG/GM OP OINT: TOPICAL_OINTMENT | OPHTHALMIC | 0 refills | Status: DC

## 2023-06-22 NOTE — Patient Instructions (Signed)
 VISIT SUMMARY:  You visited today due to a swollen stye on your left lower eyelid. The swelling has reduced but still causes discomfort. You also experience headaches likely due to recent travel and time change. You have a history of dry eyes and have been using over-the-counter treatments.  YOUR PLAN:  HORDEOLUM (STYE) OF THE LEFT LOWER EYELID: You have a swollen stye on your left lower eyelid. The swelling has reduced but still causes discomfort. -Apply warm compresses for 5-10 minutes using either a heated eye mask or a warm washcloth. -Use Ocusoft lid scrubs for eyelid hygiene. These are available as premoistened pads or spray. -Use erythromycin ophthalmic ointment 3-4 times daily. -Monitor for severe headache, increased redness, pain with eye movement, or new symptoms indicating a deeper infection. If these occur, you may need oral antibiotics.  DRY EYES: You have a history of dry eyes and have been using preservative-free artificial tears. -Continue using preservative-free artificial tears (Systane) to help flush irritants and maintain moisture.

## 2023-06-22 NOTE — Progress Notes (Signed)
 Patient ID: [name] [name], female    DOB: 13-Apr-1965, 58 y.o.   MRN: [id number]   Assessment & Plan:  Hordeolum internum left lower eyelid -     Erythromycin; One-half inch (1.25 cm) four times daily for 5 to 7 days to affected eye(s)  Dispense: 3.5 g; Refill: 0    Assessment & Plan  Assessment and Plan    Hordeolum (stye) of the left lower eyelid Presents with a hordeolum on the left lower eyelid, swollen since last night, now reduced to half its original size. Reports headache likely related to recent travel and time change, not a deeper infection. No signs of deeper infection requiring oral antibiotics. - Recommend Ocusoft lid scrubs for eyelid hygiene, available as premoistened pads or spray. - Advise warm compresses for 5-10 minutes, using either a heated eye mask or a warm washcloth. - Prescribe erythromycin ophthalmic ointment to be applied 3-4 times daily. - Instruct to monitor for severe headache, increased redness, pain with eye movement, or new symptoms indicating a deeper infection, which would require oral antibiotics.  Dry eyes Uses preservative-free artificial tears (Systane) as recommended by an eye doctor. Has not used drops as frequently due to current eye condition. Advised that preservative-free drops can help flush irritants and maintain moisture. - Continue using preservative-free artificial tears (Systane) to help flush irritants and maintain moisture.           No follow-ups on file.    Subjective:    Chief Complaint  Patient presents with   Eye Problem    Pt in today for a stye on the left eye, last Monday she was flying to [place] and something got into her eye and it burned really bad, she tried to flush it out, next morning she woke up and her eye was swollen; pt thinks its drained last night but there is still a swollen knot; headache since Saturday;     Eye Problem    Discussed the use of AI scribe software for clinical note  transcription with the patient, who gave verbal consent to proceed.  History of Present Illness  Discussed the use of AI scribe software for clinical note transcription with the patient, who gave verbal consent to proceed.  History of Present Illness   [name] [name] is a 58 year old female who presents with a swollen left lower eyelid stye.  She has been experiencing a swollen left lower eyelid stye that began last night. The eye is very swollen and drained while she was sleeping, causing a mess. The swelling persists, impacting her ability to work, attend appointments, and her appearance for upcoming family pictures.  She recalls a previous stye on her right eye over a year ago, which was deep and took a while to resolve. At that time, she used medication prescribed through an online consultation as she was unable to see her current provider.  She traveled to [facility] for work last week, involving a three-hour time change. During the flight, she got something in her eye that burned, which she believes initiated the current issue. Since then, her eye has been sore, turned red, and she has felt very tired.  She experiences headaches, which she attributes to travel and time change. The swelling has reduced to about half its original size but remains sore. She has been using over-the-counter sty ointment and applying heat to the area.  She has a history of dry eyes and uses preservative-free drops as recommended  by her eye doctor. She has not used them as much recently due to uncertainty about their effect on the stye. She last saw her eye doctor in August or September of the previous year.  No contact lens use. No deeper infection symptoms such as severe headache, significant redness, or pain with eye movement.         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[facility])    Dx Nov 2019; May 2020   Psoriasis     Past Surgical History:  Procedure Laterality Date   ABDOMINAL HYSTERECTOMY     complete   BIOPSY  12/05/2017   Procedure: BIOPSY;  Surgeon: Genell Ken, MD;  Location: [facility];  Service: Gastroenterology;;   CATARACT EXTRACTION  01/10/2009   bilateral   CESAREAN SECTION  1995 and 1997   COLON RESECTION  05/2018   Ileum, appendix, part of small int and colon; 17 lymph nodes removed, one had cacner   COLONOSCOPY N/A 12/05/2017   Procedure: COLONOSCOPY;  Surgeon: Genell Ken, MD;  Location: [facility];  Service: Gastroenterology;  Laterality: N/A;   HERNIA REPAIR  01/11/2003   umbilical hernia   HERNIA REPAIR  01/11/2011   NASAL SEPTOPLASTY W/ TURBINOPLASTY Bilateral 09/17/2021   Procedure: NASAL SEPTOPLASTY WITH TURBINATE REDUCTION;  Surgeon: Reynold Caves, MD;  Location: [facility];  Service: ENT;  Laterality: Bilateral;   NEPHRECTOMY TRANSPLANTED ORGAN     PERITONEAL CATHETER INSERTION     VENTRAL HERNIA REPAIR  07/04/2011   Procedure: HERNIA REPAIR VENTRAL ADULT;  Surgeon: Kari Otto. Eli Grizzle, MD;  Location: [facility];  Service: General;  Laterality: N/A;    Family History  Problem Relation Age of Onset   Arthritis Mother    Hypertension Father    Polycystic kidney disease Father        died before starting dialysis was near ESRD   Cerebral aneurysm Father        died age 31   Stroke Father    Arthritis Father    Cancer Sister 15       cancer that developed in the fatty tissues of the muscle   Kidney disease Brother     Hypertension Brother    Polycystic kidney disease Brother        transplant  7 years ago   Cancer Maternal Grandfather    Cancer Paternal Grandmother    Arthritis Paternal Grandmother    Cancer Paternal Grandfather    Hearing loss Paternal Grandfather     Social History   Tobacco Use   Smoking status: Never   Smokeless tobacco: Never  Vaping Use   Vaping status: Never Used  Substance Use Topics   Alcohol use: No   Drug use: No     Allergies  Allergen Reactions   Wound Dressing Adhesive Itching   Vancomycin  Hives,  Itching, Other (See Comments) and Rash    Received via IV   Silicone Dermatitis   Tape Other (See Comments), Rash and Dermatitis    Tears the skin- please use an alternative    Review of Systems NEGATIVE UNLESS OTHERWISE INDICATED IN HPI      Objective:     BP 130/84 (BP Location: Right Arm, Patient Position: Sitting, Cuff Size: Normal)   Pulse 79   Temp 98.1 F (36.7 C) (Temporal)   Ht 5' 6 (1.676 m)   Wt 271 lb 3.2 oz (123 kg)   LMP  (LMP Unknown)   SpO2 98%   BMI 43.77 kg/m   Wt Readings from Last 3 Encounters:  06/22/23 271 lb 3.2 oz (123 kg)  02/16/23 278 lb 6.4 oz (126.3 kg)  10/19/22 275 lb 6.4 oz (124.9 kg)    BP Readings from Last 3 Encounters:  06/22/23 130/84  02/16/23 136/86  10/19/22 (!) 140/80     Physical Exam Vitals and nursing note reviewed.  Constitutional:      Appearance: Normal appearance.   Eyes:     General:        Left eye: Hordeolum (left lower internum) present.    Extraocular Movements: Extraocular movements intact.     Conjunctiva/sclera: Conjunctivae normal.     Pupils: Pupils are equal, round, and reactive to light.     Comments: No pain with EOM   Neurological:     General: No focal deficit present.     Mental Status: She is alert and oriented to person, place, and time.   Psychiatric:        Mood and Affect: Mood normal.        Behavior: Behavior normal.             Esgar Barnick M Jasmen Emrich,  PA-C

## 2023-07-18 ENCOUNTER — Encounter: Payer: Self-pay | Admitting: Physician Assistant

## 2023-08-02 ENCOUNTER — Ambulatory Visit (INDEPENDENT_AMBULATORY_CARE_PROVIDER_SITE_OTHER): Payer: Commercial Managed Care - PPO | Admitting: Physician Assistant

## 2023-08-02 ENCOUNTER — Encounter: Payer: Self-pay | Admitting: Physician Assistant

## 2023-08-02 VITALS — BP 132/82 | HR 88 | Temp 98.1°F | Ht 65.75 in | Wt 266.6 lb

## 2023-08-02 DIAGNOSIS — D485 Neoplasm of uncertain behavior of skin: Secondary | ICD-10-CM

## 2023-08-02 DIAGNOSIS — Z0000 Encounter for general adult medical examination without abnormal findings: Secondary | ICD-10-CM | POA: Diagnosis not present

## 2023-08-02 DIAGNOSIS — G4700 Insomnia, unspecified: Secondary | ICD-10-CM | POA: Diagnosis not present

## 2023-08-02 MED ORDER — TRIAMCINOLONE ACETONIDE 0.1 % EX CREA
1.0000 | TOPICAL_CREAM | Freq: Two times a day (BID) | CUTANEOUS | 0 refills | Status: AC
Start: 2023-08-02 — End: ?

## 2023-08-02 NOTE — Progress Notes (Signed)
 Patient ID: [name] [name], female    DOB: [birth year], 58 y.o.   MRN: [id number]   Assessment & Plan:  Annual physical exam  Neoplasm of uncertain behavior of skin -     Triamcinolone  Acetonide; Apply 1 Application topically 2 (two) times daily. For 7-10 days maximum  Dispense: 30 g; Refill: 0  Insomnia, unspecified type      Assessment and Plan Assessment & Plan Irritated skin lesion An irritated lesion on the center of her chest has grown, likely due to clothing or location, but does not appear suspicious. - Prescribe triamcinolone  cream for topical application. - Advise contacting her previous dermatologist for evaluation or offer referral to a new dermatologist if needed.  Neuroendocrine Carcinoma She undergoes annual scans to monitor for metastasis, particularly to the liver, with no changes observed in recent scans. Continued annual scans are warranted due to the stability of her condition over the past five years. - Continue annual scans to monitor for metastasis, particularly to the liver.  Polycystic Kidney Disease (PKD) She is managed by multiple specialists for polycystic kidney disease. Liver spots potentially related to PKD are monitored, with no changes observed in recent scans. - Continue regular follow-up with specialists for PKD management. - Monitor liver spots during annual scans.  Type 2 Diabetes Mellitus She is on Ozempic for blood sugar management and weight loss, which is a beneficial byproduct of the medication. - Continue Ozempic for blood sugar management. - Encourage weight loss through diet and exercise. - Follows with endocrinology   Insomnia She has difficulty returning to sleep after waking. Children's Benadryl  was effective in the past and is considered safe. Hydroxyzine is an alternative if Benadryl  is ineffective. - Advise trying children's Benadryl  for sleep issues. - Consider prescribing hydroxyzine if Benadryl  is  ineffective.  General Health Maintenance She is current on vaccinations and screenings, including shingles, pneumonia, and flu vaccines. COVID-19 vaccinations are advised against due to potential kidney transplant complications. - Schedule next mammogram for January. - Plan for bone density test at age 29. - Continue regular health maintenance screenings and vaccinations as advised by specialists.  Follow-up Follow-up is advised in one year or sooner if needed. - Schedule follow-up appointment in one year or sooner if needed.   Patient Counseling: [x]   Nutrition: Stressed importance of moderation in sodium/caffeine intake, saturated fat and cholesterol, caloric balance, sufficient intake of fresh fruits, vegetables, and fiber.  [x]   Stressed the importance of regular exercise.   [x]   Substance Abuse: Discussed cessation/primary prevention of tobacco, alcohol, or other drug use; driving or other dangerous activities under the influence; availability of treatment for abuse.   []   Injury prevention: Discussed safety belts, safety helmets, smoke detector, smoking near bedding or upholstery.   []   Sexuality: Discussed sexually transmitted diseases, partner selection, use of condoms, avoidance of unintended pregnancy  and contraceptive alternatives.   [x]   Dental health: Discussed importance of regular tooth brushing, flossing, and dental visits.  [x]   Health maintenance and immunizations reviewed. Please refer to Health maintenance section.        Return in about 1 year (around 08/01/2024) for physical.    Subjective:    Chief Complaint  Patient presents with   Annual Exam    Pt in office for annual CPE and fasting labs;     HPI Discussed the use of AI scribe software for clinical note transcription with the patient, who gave verbal consent to proceed.  History of Present Illness  [name] [name] is a 58 year old female with a history of polycystic kidney disease, neuroendocrine  carcinoma, and a kidney transplant who presents for an annual physical exam.  She has a history of polycystic kidney disease, neuroendocrine carcinoma, and a kidney transplant. She undergoes regular lab work and scans to monitor her conditions. She is scheduled for routine labs next week for [facility] and has monthly labs. She also has labs scheduled for October with Dr. Ballen and for September with [facility]. Her cholesterol is monitored by Dr. Clayborn and Dr. Karrie, with her last total cholesterol being 108 and LDL at 28, which she attributes to genetics and medication. [facility] scans at [facility] monitor for neuroendocrine carcinoma, with the last scan showing no changes in her liver. The scans have been reduced to annually after five years of stability. She mentions spots in her liver that are likely related to polycystic kidney disease, present before her transplant.  She has noticed a spot on her skin that has grown and become irritated, possibly due to clothing. She previously had a cyst removed from her thigh about three years ago and is considering contacting the same dermatology office.  She experiences sleep disturbances, waking up and being unable to return to sleep. She underwent menopause at 34 following a hysterectomy and has not had sleep issues until recently. She previously used children's Benadryl  for sleep issues related to kidney function and is considering trying it again.  She is actively working on weight loss, reducing carbohydrate intake, and using a stationary bike. She is considering swimming as an exercise option but is cautious about pool hygiene due to her health conditions. She is on Ozempic to stabilize blood sugar, which has also aided in weight loss.  Her husband is on permanent disability following a leg amputation due to complications from neuropathy and diabetes. She has two small dogs, which were recommended for his emotional support.     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[facility])    Dx Nov 2019; May 2020   Psoriasis     Past Surgical History:  Procedure Laterality Date   ABDOMINAL HYSTERECTOMY     complete   APPENDECTOMY  05/2017   BIOPSY  12/05/2017   Procedure: BIOPSY;  Surgeon: Saintclair Jasper, MD;  [facility]: [facility];  Service: Gastroenterology;;   CATARACT EXTRACTION  01/10/2009   bilateral   CESAREAN SECTION  1995 and 1997   COLON RESECTION  05/2018   Ileum, appendix, part of small int and colon; 17 lymph nodes removed, one had cacner   COLON SURGERY  05/2017   COLONOSCOPY N/A 12/05/2017   Procedure: COLONOSCOPY;  Surgeon: Saintclair Jasper, MD;  Location: [facility];  Service: Gastroenterology;  Laterality: N/A;   HERNIA REPAIR  01/11/2003   umbilical hernia   HERNIA  REPAIR  01/11/2011   NASAL SEPTOPLASTY W/ TURBINOPLASTY Bilateral 09/17/2021   Procedure: NASAL SEPTOPLASTY WITH TURBINATE REDUCTION;  Surgeon: Karis Clunes, MD;  Location[facility];  Service: ENT;  Laterality: Bilateral;   NEPHRECTOMY TRANSPLANTED ORGAN     PERITONEAL CATHETER INSERTION     SMALL INTESTINE SURGERY  05/2017   VENTRAL HERNIA REPAIR  07/04/2011   Procedure: HERNIA REPAIR VENTRAL ADULT;  Surgeon: Donnice POUR. Tsuei, MD;  Location: [facility];  Service: General;  Laterality: N/A;    Family History  Problem Relation Age of Onset   Arthritis Mother    Hypertension Father    Polycystic kidney disease Father         died before starting dialysis was near ESRD   Cerebral aneurysm Father        died age 12   Stroke Father    Arthritis Father    Cancer Sister 59       cancer that developed in the fatty tissues of the muscle   Kidney disease Brother    Hypertension Brother    Polycystic kidney disease Brother        transplant  7 years ago   Cancer Maternal Grandfather    Hearing loss Maternal Grandfather    Cancer Paternal Grandmother    Arthritis Paternal Grandmother    Cancer Paternal Grandfather    Hearing loss Paternal Grandfather     Social History   Tobacco Use   Smoking status: Never   Smokeless tobacco: Never  Vaping Use   Vaping status: Never Used  Substance Use Topics   Alcohol use: No   Drug use: No     Allergies  Allergen Reactions   Wound Dressing Adhesive Itching   Vancomycin  Hives, Itching, Other (See Comments) and Rash    Received via IV   Silicone Dermatitis   Tape Other (See Comments), Rash and Dermatitis    Tears the skin- please use an alternative    Review of Systems NEGATIVE UNLESS OTHERWISE INDICATED IN HPI      Objective:     BP 132/82 (BP Location: Left Arm, Patient Position: Sitting, Cuff Size: Large)   Pulse 88   Temp 98.1 F (36.7 C) (Temporal)   Ht 5' 5.75 (1.67 m)   Wt 266 lb 9.6 oz (120.9 kg)   LMP  (LMP Unknown)   SpO2 99%   BMI 43.36 kg/m   Wt Readings from Last 3 Encounters:  08/02/23 266 lb 9.6 oz (120.9 kg)  06/22/23 271 lb 3.2 oz (123 kg)  02/16/23 278 lb 6.4 oz (126.3 kg)    BP Readings from Last 3 Encounters:  08/02/23 132/82  06/22/23 130/84  02/16/23 136/86     Physical Exam Vitals and nursing note reviewed.  Constitutional:      Appearance: Normal appearance. She is obese. She is not toxic-appearing.  HENT:     Head: Normocephalic and atraumatic.     Right Ear: Tympanic membrane, ear canal and external ear normal.     Left Ear: Tympanic membrane, ear canal and external ear normal.     Nose: Nose  normal.     Mouth/Throat:     Mouth: Mucous membranes are moist.  Eyes:     Extraocular Movements: Extraocular movements intact.     Conjunctiva/sclera: Conjunctivae normal.     Pupils: Pupils are equal, round, and reactive to light.  Cardiovascular:     Rate and Rhythm: Normal rate and regular rhythm.     Pulses:  Normal pulses.     Heart sounds: Normal heart sounds.  Pulmonary:     Effort: Pulmonary effort is normal.     Breath sounds: Normal breath sounds.  Abdominal:     General: Abdomen is flat. Bowel sounds are normal.     Palpations: Abdomen is soft.  Musculoskeletal:        General: Normal range of motion.     Cervical back: Normal range of motion and neck supple.  Skin:    General: Skin is warm and dry.     Findings: Lesion (chest - see photo below) present.  Neurological:     General: No focal deficit present.     Mental Status: She is alert and oriented to person, place, and time.  Psychiatric:        Mood and Affect: Mood normal.        Behavior: Behavior normal.        Thought Content: Thought content normal.        Judgment: Judgment normal.             Avishai Reihl M Edessa Jakubowicz, PA-C

## 2023-09-20 ENCOUNTER — Telehealth: Admitting: Family Medicine

## 2023-09-20 DIAGNOSIS — J069 Acute upper respiratory infection, unspecified: Secondary | ICD-10-CM

## 2023-09-20 MED ORDER — DOXYCYCLINE HYCLATE 100 MG PO TABS: 100.0000 mg | ORAL_TABLET | Freq: Two times a day (BID) | ORAL | 0 refills | Status: AC

## 2023-09-20 MED ORDER — BENZONATATE 100 MG PO CAPS: 100.0000 mg | ORAL_CAPSULE | Freq: Three times a day (TID) | ORAL | 0 refills | Status: AC | PRN

## 2023-09-20 NOTE — Progress Notes (Signed)
 Virtual Visit Consent   [name] [name], you are scheduled for a virtual visit with a [facility] provider today. Just as with appointments in the office, your consent must be obtained to participate. Your consent will be active for this visit and any virtual visit you may have with one of our providers in the next 365 days. If you have a MyChart account, a copy of this consent can be sent to you electronically.  As this is a virtual visit, video technology does not allow for your provider to perform a traditional examination. This may limit your provider's ability to fully assess your condition. If your provider identifies any concerns that need to be evaluated in person or the need to arrange testing (such as labs, EKG, etc.), we will make arrangements to do so. Although advances in technology are sophisticated, we cannot ensure that it will always work on either your end or our end. If the connection with a video visit is poor, the visit may have to be switched to a telephone visit. With either a video or telephone visit, we are not always able to ensure that we have a secure connection.  By engaging in this virtual visit, you consent to the provision of healthcare and authorize for your insurance to be billed (if applicable) for the services provided during this visit. Depending on your insurance coverage, you may receive a charge related to this service.  I need to obtain your verbal consent now. Are you willing to proceed with your visit today? [name] [name] has provided verbal consent on 09/20/2023 for a virtual visit (video or telephone). [name] [name] Barefoot, NP  Date: 09/20/2023 1:29 PM   Virtual Visit via Video Note   I, [name] [name], connected with  [name] [name]  ([id number], 1965/09/08) on 09/20/23 at  1:30 PM EDT by a video-enabled telemedicine application and verified that I am speaking with the correct person using two identifiers.  Location: Patient: Virtual Visit Location  Patient: Home Provider: Virtual Visit Location Provider: Home Office   I discussed the limitations of evaluation and management by telemedicine and the availability of in person appointments. The patient expressed understanding and agreed to proceed.    History of Present Illness: [name] [name] is a 58 y.o. who identifies as a female who was assigned female at birth, and is being seen today for cold sinus symptoms  Onset was 5 days (recent travel) started with sore throat  Associated symptoms are runny nose, dry cough at times, and fatigue, achy at times, feeling cold- not chills Modifying factors are safe tussen helps some- but nothing is going away Denies chest pain, shortness of breath, fevers, chills  Exposure to sick contacts- unknown COVID test: no   Kidney Transplant Pt with immunocompromising meds   Problems:  Patient Active Problem List   Diagnosis Date Noted   Pain in left shoulder 05/16/2023   Partial thickness rotator cuff tear 04/11/2023   Knee pain 02/16/2023   Primary osteoarthritis 02/16/2023   Otalgia 10/19/2022   Acute lower gastrointestinal bleeding 06/27/2019   Obesity, Class III, BMI 40-49.9 (morbid obesity) 06/27/2019   H/O right hemicolectomy 06/27/2019   GI bleed 06/27/2019   Acute GI bleeding 06/26/2019   PTDM (post-transplant diabetes mellitus) (HCC) 06/07/2018   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   Polycystic kidney disease 04/19/2017   Diabetes mellitus type 2, insulin  dependent (HCC) 04/19/2017   Kidney transplant recipient 09/23/2011   Immunosuppressive management encounter following kidney  transplant 09/23/2011   Psoriasis 09/23/2011   Annual physical exam 06/07/2011    Allergies:  Allergies  Allergen Reactions   Wound Dressing Adhesive Itching   Vancomycin  Hives, Itching, Other (See Comments) and Rash    Received via IV   Silicone Dermatitis   Tape Other (See Comments), Rash and Dermatitis    Tears the skin-  please use an alternative   Medications:  Current Outpatient Medications:    acetaminophen  (TYLENOL ) 325 MG tablet, Take 2 tablets (650 mg total) by mouth daily as needed for mild pain or headache., Disp: 30 tablet, Rfl: 0   aspirin EC (ASPIRIN 81) 81 MG tablet, Take 81 mg by mouth daily., Disp: , Rfl:    atorvastatin  (LIPITOR) 40 MG tablet, Take 40 mg by mouth at bedtime. , Disp: , Rfl:    Cholecalciferol 50 MCG (2000 UT) TABS, 1 tablet Orally Once a day; Duration: 30 day(s), Disp: , Rfl:    Continuous Glucose Sensor (FREESTYLE LIBRE 2 SENSOR) MISC, Inject into the skin every 14 (fourteen) days., Disp: , Rfl:    HUMALOG KWIKPEN 100 UNIT/ML KwikPen, as directed Subcutaneous, Disp: , Rfl:    insulin  glargine-yfgn (SEMGLEE) 100 UNIT/ML Pen, Inject 25 Units into the skin at bedtime., Disp: , Rfl:    Insulin  Pen Needle 31G X 5 MM MISC, USE AS DIRECTED ONCE DAILY, Disp: , Rfl:    MYFORTIC  360 MG TBEC EC tablet, Take 360 mg by mouth 2 (two) times daily., Disp: , Rfl:    OZEMPIC, 2 MG/DOSE, 8 MG/3ML SOPN, 2MG  SUBCUTANEOUS ONCE AWEEK 90 DAYS, Disp: , Rfl:    PROGRAF  1 MG capsule, Take 1 mg by mouth 2 (two) times daily., Disp: , Rfl:    triamcinolone  cream (KENALOG ) 0.1 %, Apply 1 Application topically 2 (two) times daily. For 7-10 days maximum, Disp: 30 g, Rfl: 0  Observations/Objective: Patient is well-developed, well-nourished in no acute distress.  Resting comfortably  at home.  Head is normocephalic, atraumatic.  No labored breathing.  Speech is clear and coherent with logical content.  Patient is alert and oriented at baseline.  Congestion tone noted  Assessment and Plan:  1. Viral URI with cough (Primary)  - doxycycline  (VIBRA -TABS) 100 MG tablet; Take 1 tablet (100 mg total) by mouth 2 (two) times daily for 7 days.  Dispense: 14 tablet; Refill: 0 - benzonatate  (TESSALON ) 100 MG capsule; Take 1 capsule (100 mg total) by mouth 3 (three) times daily as needed for cough.  Dispense: 30  capsule; Refill: 0   URI recommendations: -get covid/flu testing -Delay RX given transplant Hx and immunocompromised  - Increased rest - Increasing Fluids - Acetaminophen  / ibuprofen as needed for fever/pain.  - Salt water gargling, chloraseptic spray and throat lozenges - Saline nasal spray if congestion or if nasal passages feel dry. - Humidifying the air.       Reviewed side effects, risks and benefits of medication.    Patient acknowledged agreement and understanding of the plan.   Past Medical, Surgical, Social History, Allergies, and Medications have been Reviewed.     Follow Up Instructions: I discussed the assessment and treatment plan with the patient. The patient was provided an opportunity to ask questions and all were answered. The patient agreed with the plan and demonstrated an understanding of the instructions.  A copy of instructions were sent to the patient via MyChart unless otherwise noted below.    The patient was advised to call back or seek an in-person evaluation  if the symptoms worsen or if the condition fails to improve as anticipated.    [name] [name] Barefoot, NP

## 2023-09-20 NOTE — Patient Instructions (Addendum)
 [name] [name], thank you for joining [name], NP for today's virtual visit.  While this provider is not your primary care provider (PCP), if your PCP is located in our provider database this encounter information will be shared with them immediately following your visit.   A [facility] MyChart account gives you access to today's visit and all your visits, tests, and labs performed at [facility]  click here if you don't have a [facility] MyChart account or go to [email or web address]  Consent: (Patient) [name] [name] provided verbal consent for this virtual visit at the beginning of the encounter.  Current Medications:  Current Outpatient Medications:    benzonatate  (TESSALON ) 100 MG capsule, Take 1 capsule (100 mg total) by mouth 3 (three) times daily as needed for cough., Disp: 30 capsule, Rfl: 0   acetaminophen  (TYLENOL ) 325 MG tablet, Take 2 tablets (650 mg total) by mouth daily as needed for mild pain or headache., Disp: 30 tablet, Rfl: 0   aspirin EC (ASPIRIN 81) 81 MG tablet, Take 81 mg by mouth daily., Disp: , Rfl:    atorvastatin  (LIPITOR) 40 MG tablet, Take 40 mg by mouth at bedtime. , Disp: , Rfl:    Cholecalciferol 50 MCG (2000 UT) TABS, 1 tablet Orally Once a day; Duration: 30 day(s), Disp: , Rfl:    Continuous Glucose Sensor (FREESTYLE LIBRE 2 SENSOR) MISC, Inject into the skin every 14 (fourteen) days., Disp: , Rfl:    [START ON 09/23/2023] doxycycline  (VIBRA -TABS) 100 MG tablet, Take 1 tablet (100 mg total) by mouth 2 (two) times daily for 7 days., Disp: 14 tablet, Rfl: 0   HUMALOG KWIKPEN 100 UNIT/ML KwikPen, as directed Subcutaneous, Disp: , Rfl:    insulin  glargine-yfgn (SEMGLEE) 100 UNIT/ML Pen, Inject 25 Units into the skin at bedtime., Disp: , Rfl:    Insulin  Pen Needle 31G X 5 MM MISC, USE AS DIRECTED ONCE DAILY, Disp: , Rfl:    MYFORTIC  360 MG TBEC EC tablet, Take 360 mg by mouth 2 (two) times daily., Disp: , Rfl:    OZEMPIC, 2 MG/DOSE, 8  MG/3ML SOPN, 2MG  SUBCUTANEOUS ONCE AWEEK 90 DAYS, Disp: , Rfl:    PROGRAF  1 MG capsule, Take 1 mg by mouth 2 (two) times daily., Disp: , Rfl:    triamcinolone  cream (KENALOG ) 0.1 %, Apply 1 Application topically 2 (two) times daily. For 7-10 days maximum, Disp: 30 g, Rfl: 0   Medications ordered in this encounter:  Meds ordered this encounter  Medications   doxycycline  (VIBRA -TABS) 100 MG tablet    Sig: Take 1 tablet (100 mg total) by mouth 2 (two) times daily for 7 days.    Dispense:  14 tablet    Refill:  0    Supervising Provider:   LAMPTEY, PHILIP O [[id number]]   benzonatate  (TESSALON ) 100 MG capsule    Sig: Take 1 capsule (100 mg total) by mouth 3 (three) times daily as needed for cough.    Dispense:  30 capsule    Refill:  0    Supervising Provider:   BLAISE ALEENE KIDD [[id number]]     *If you need refills on other medications prior to your next appointment, please contact your pharmacy*  Follow-Up: Call back or seek an in-person evaluation if the symptoms worsen or if the condition fails to improve as anticipated.  [facility] Care [phone]  Other Instructions URI recommendations: -consider covid flu testing -Delay RX given transplant Hx and immunocompromised  - Increased  rest - Increasing Fluids - Acetaminophen  / ibuprofen as needed for fever/pain.  - Salt water gargling, chloraseptic spray and throat lozenges - Saline nasal spray if congestion or if nasal passages feel dry. - Humidifying the air.     If you have been instructed to have an in-person evaluation today at a local Urgent Care facility, please use the link below. It will take you to a list of all of our available [facility], including address, phone number and hours of operation. Please do not delay care.  [facility]  If you or a family member do not have a primary care provider, use the link below to schedule a visit and establish care. When you choose a [facility]  primary care physician or advanced practice provider, you gain a long-term partner in health. Find a Primary Care Provider  Learn more about [facility]'s in-office and virtual care options: [facility] - Get Care Now

## 2023-10-24 ENCOUNTER — Encounter: Payer: Self-pay | Admitting: Physician Assistant

## 2023-10-24 LAB — OPHTHALMOLOGY REPORT-SCANNED

## 2024-01-30 ENCOUNTER — Encounter: Payer: Self-pay | Admitting: Physician Assistant

## 2024-01-30 NOTE — Telephone Encounter (Signed)
 Please see pt msg and concerns and advise recommendations

## 2024-01-31 ENCOUNTER — Other Ambulatory Visit: Payer: Self-pay

## 2024-01-31 DIAGNOSIS — G8929 Other chronic pain: Secondary | ICD-10-CM

## 2024-01-31 DIAGNOSIS — M25612 Stiffness of left shoulder, not elsewhere classified: Secondary | ICD-10-CM

## 2024-02-01 ENCOUNTER — Other Ambulatory Visit: Payer: Self-pay | Admitting: Physician Assistant

## 2024-02-01 DIAGNOSIS — Z1231 Encounter for screening mammogram for malignant neoplasm of breast: Secondary | ICD-10-CM

## 2024-02-02 ENCOUNTER — Encounter

## 2024-02-16 ENCOUNTER — Ambulatory Visit: Admitting: Orthopedic Surgery

## 2024-02-16 ENCOUNTER — Other Ambulatory Visit: Payer: Self-pay

## 2024-02-16 DIAGNOSIS — M7502 Adhesive capsulitis of left shoulder: Secondary | ICD-10-CM

## 2024-02-16 DIAGNOSIS — M25512 Pain in left shoulder: Secondary | ICD-10-CM

## 2024-02-16 NOTE — Progress Notes (Unsigned)
 "  Office Visit Note   Patient: [name] [name]           Date of Birth: 1965-02-14           MRN: [id number] Visit Date: 02/16/2024 Requested by: Allwardt, Mardy HERO, PA-C [address] [place],  [place] [address] PCP: Kathrene Mardy HERO, PA-C  Subjective: Chief Complaint  Patient presents with   Left Shoulder - Pain    HPI: [name] [name] is a 59 y.o. female who presents to the office reporting ***.                ROS: All systems reviewed are negative as they relate to the chief complaint within the history of present illness.  Patient denies fevers or chills.  Assessment & Plan: Visit Diagnoses:  1. Left shoulder pain, unspecified chronicity   2. Adhesive capsulitis of left shoulder     Plan: ***  Follow-Up Instructions: Return in about 6 weeks (around 03/29/2024).   Orders:  Orders Placed This Encounter  Procedures   XR Shoulder Left   US  Guided Needle Placement - No Linked Charges   Ambulatory referral to Physical Therapy   No orders of the defined types were placed in this encounter.     Procedures: Large Joint Inj: L glenohumeral on 02/16/2024 2:38 PM Indications: diagnostic evaluation and pain Details: 22 G 3.5 in needle, ultrasound-guided posterior approach  Arthrogram: No  Medications: 9 mL bupivacaine  0.5 %; 5 mL lidocaine  1 %; 40 mg triamcinolone  acetonide 40 MG/ML Outcome: tolerated well, no immediate complications Procedure, treatment alternatives, risks and benefits explained, specific risks discussed. Consent was given by the patient. Immediately prior to procedure a time out was called to verify the correct patient, procedure, equipment, support staff and site/side marked as required. Patient was prepped and draped in the usual sterile fashion.       Clinical Data: No additional findings.  Objective: Vital Signs: LMP  (LMP Unknown)   Physical Exam:  Constitutional: Patient appears well-developed HEENT:  Head: Normocephalic Eyes:EOM  are normal Neck: Normal range of motion Cardiovascular: Normal rate Pulmonary/chest: Effort normal Neurologic: Patient is alert Skin: Skin is warm Psychiatric: Patient has normal mood and affect  Ortho Exam: ***  Specialty Comments:  No specialty comments available.  Imaging: XR Shoulder Left Result Date: 02/16/2024 AP outlet axillary lateral radiographs left shoulder reviewed.  No acute fracture.  Shoulder is located.  Acromiohumeral distance is normal.  No significant degenerative changes in the glenohumeral or AC joint.  Visualized lung fields clear.   US  Guided Needle Placement - No Linked Charges Result Date: 02/16/2024 Ultrasound imaging demonstrates needle placement into the glenohumeral joint with injection of fluid into the joint and no complicating features.     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   Polycystic kidney disease 04/19/2017   Diabetes mellitus type 2, insulin  dependent (HCC) 04/19/2017   Kidney transplant recipient 09/23/2011   Immunosuppressive management encounter following kidney transplant 09/23/2011   Psoriasis  09/23/2011   Annual physical exam 06/07/2011   Past Medical History:  Diagnosis Date   Arthritis    Cataract 2004   Chronic kidney disease    Diabetes mellitus without complication (HCC)    Dyspnea    Eczema    Enlarged kidney    bilateral   Gout    previously   History of chicken pox    History of recurrent  UTIs    Hyperlipidemia    reports doesnt have elevated lipids, but takes due to hx of kidney transplant    Hypertension    reports BP normalized after transplant   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    Polycystic kidney disease    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[facility])    Dx Nov 2019; May 2020   Psoriasis     Family History  Problem Relation Age of Onset   Arthritis Mother    Hypertension Father    Polycystic kidney disease Father        died before starting dialysis was near ESRD   Cerebral aneurysm Father        died age 14   Stroke Father    Arthritis Father    Cancer Sister 3       cancer that developed in the fatty tissues of the muscle   Kidney disease Brother    Hypertension Brother    Polycystic kidney disease Brother        transplant  7 years ago   Cancer Maternal Grandfather    Hearing loss Maternal Grandfather    Cancer Paternal Grandmother    Arthritis Paternal Grandmother    Cancer Paternal Grandfather    Hearing loss Paternal Grandfather     Past Surgical History:  Procedure Laterality Date   ABDOMINAL HYSTERECTOMY     complete   APPENDECTOMY  05/2017   BIOPSY  12/05/2017   Procedure: BIOPSY;  Surgeon: Saintclair Jasper, MD;  Location: [facility];  Service: Gastroenterology;;   CATARACT EXTRACTION  01/10/2009   bilateral   CESAREAN SECTION  1995 and 1997   COLON RESECTION  05/2018   Ileum, appendix, part of small int and colon; 17 lymph nodes removed, one had cacner   COLON SURGERY  05/2017   COLONOSCOPY N/A 12/05/2017   Procedure: COLONOSCOPY;  Surgeon: Saintclair Jasper, MD;  Location: [facility];  Service: Gastroenterology;  Laterality: N/A;   HERNIA REPAIR  01/11/2003   umbilical hernia   HERNIA REPAIR  01/11/2011   NASAL SEPTOPLASTY W/ TURBINOPLASTY Bilateral 09/17/2021   Procedure: NASAL SEPTOPLASTY WITH TURBINATE REDUCTION;  Surgeon: Karis Clunes, MD;  Location: [facility];  Service: ENT;   Laterality: Bilateral;   NEPHRECTOMY TRANSPLANTED ORGAN     PERITONEAL CATHETER INSERTION     SMALL INTESTINE SURGERY  05/2017   VENTRAL HERNIA REPAIR  07/04/2011   Procedure: HERNIA REPAIR VENTRAL ADULT;  Surgeon: Donnice POUR. Belinda, MD;  Location: [facility] OR;  Service: General;  Laterality: N/A;   Social History   Occupational History    Employer: [employer]    Comment: Insurance agent  Tobacco Use   Smoking status: Never   Smokeless tobacco: Never  Vaping Use   Vaping status: Never Used  Substance and Sexual Activity   Alcohol use: No   Drug use: No   Sexual activity: Yes    Partners: Male        "

## 2024-02-26 ENCOUNTER — Encounter

## 2024-03-29 ENCOUNTER — Ambulatory Visit: Admitting: Orthopedic Surgery

## 2024-08-02 ENCOUNTER — Encounter: Admitting: Physician Assistant
